# Patient Record
Sex: Female | Born: 1950
Health system: Southern US, Community
[De-identification: ages and names within clinical notes are randomized; demographics above are authoritative.]

## PROBLEM LIST (undated history)

## (undated) DIAGNOSIS — Z801 Family history of malignant neoplasm of trachea, bronchus and lung: Secondary | ICD-10-CM

## (undated) DIAGNOSIS — R600 Localized edema: Secondary | ICD-10-CM

## (undated) DIAGNOSIS — E669 Obesity, unspecified: Secondary | ICD-10-CM

## (undated) DIAGNOSIS — Z808 Family history of malignant neoplasm of other organs or systems: Secondary | ICD-10-CM

## (undated) DIAGNOSIS — M1990 Unspecified osteoarthritis, unspecified site: Secondary | ICD-10-CM

## (undated) DIAGNOSIS — Z8049 Family history of malignant neoplasm of other genital organs: Secondary | ICD-10-CM

## (undated) DIAGNOSIS — I10 Essential (primary) hypertension: Secondary | ICD-10-CM

## (undated) DIAGNOSIS — N200 Calculus of kidney: Secondary | ICD-10-CM

## (undated) DIAGNOSIS — Z800 Family history of malignant neoplasm of digestive organs: Secondary | ICD-10-CM

## (undated) DIAGNOSIS — Z806 Family history of leukemia: Secondary | ICD-10-CM

## (undated) DIAGNOSIS — C50919 Malignant neoplasm of unspecified site of unspecified female breast: Secondary | ICD-10-CM

## (undated) DIAGNOSIS — Z8041 Family history of malignant neoplasm of ovary: Secondary | ICD-10-CM

## (undated) DIAGNOSIS — I491 Atrial premature depolarization: Secondary | ICD-10-CM

## (undated) DIAGNOSIS — E785 Hyperlipidemia, unspecified: Secondary | ICD-10-CM

## (undated) DIAGNOSIS — Z87442 Personal history of urinary calculi: Secondary | ICD-10-CM

## (undated) HISTORY — DX: Family history of malignant neoplasm of ovary: Z80.41

## (undated) HISTORY — DX: Family history of leukemia: Z80.6

## (undated) HISTORY — DX: Family history of malignant neoplasm of digestive organs: Z80.0

## (undated) HISTORY — DX: Family history of malignant neoplasm of other organs or systems: Z80.8

## (undated) HISTORY — DX: Hyperlipidemia, unspecified: E78.5

## (undated) HISTORY — DX: Malignant neoplasm of unspecified site of unspecified female breast: C50.919

## (undated) HISTORY — DX: Family history of malignant neoplasm of trachea, bronchus and lung: Z80.1

## (undated) HISTORY — PX: CHOLECYSTECTOMY: SHX55

## (undated) HISTORY — DX: Localized edema: R60.0

## (undated) HISTORY — DX: Family history of malignant neoplasm of other genital organs: Z80.49

## (undated) HISTORY — PX: BREAST BIOPSY: SHX20

## (undated) HISTORY — DX: Obesity, unspecified: E66.9

---

## 1998-07-08 ENCOUNTER — Emergency Department (HOSPITAL_COMMUNITY): Admission: EM | Admit: 1998-07-08 | Discharge: 1998-07-08 | Payer: Self-pay | Admitting: *Deleted

## 1998-11-22 ENCOUNTER — Other Ambulatory Visit: Admission: RE | Admit: 1998-11-22 | Discharge: 1998-11-22 | Payer: Self-pay | Admitting: *Deleted

## 2000-01-23 ENCOUNTER — Other Ambulatory Visit: Admission: RE | Admit: 2000-01-23 | Discharge: 2000-01-23 | Payer: Self-pay | Admitting: *Deleted

## 2001-01-22 ENCOUNTER — Other Ambulatory Visit: Admission: RE | Admit: 2001-01-22 | Discharge: 2001-01-22 | Payer: Self-pay | Admitting: *Deleted

## 2002-03-25 ENCOUNTER — Other Ambulatory Visit: Admission: RE | Admit: 2002-03-25 | Discharge: 2002-03-25 | Payer: Self-pay | Admitting: *Deleted

## 2003-04-15 ENCOUNTER — Other Ambulatory Visit: Admission: RE | Admit: 2003-04-15 | Discharge: 2003-04-15 | Payer: Self-pay | Admitting: *Deleted

## 2004-05-12 ENCOUNTER — Other Ambulatory Visit: Admission: RE | Admit: 2004-05-12 | Discharge: 2004-05-12 | Payer: Self-pay | Admitting: *Deleted

## 2004-05-23 ENCOUNTER — Inpatient Hospital Stay (HOSPITAL_COMMUNITY): Admission: RE | Admit: 2004-05-23 | Discharge: 2004-05-26 | Payer: Self-pay | Admitting: Orthopedic Surgery

## 2004-08-08 ENCOUNTER — Ambulatory Visit (HOSPITAL_BASED_OUTPATIENT_CLINIC_OR_DEPARTMENT_OTHER): Admission: RE | Admit: 2004-08-08 | Discharge: 2004-08-08 | Payer: Self-pay | Admitting: Orthopedic Surgery

## 2004-12-11 HISTORY — PX: JOINT REPLACEMENT: SHX530

## 2005-06-06 ENCOUNTER — Ambulatory Visit (HOSPITAL_COMMUNITY): Admission: RE | Admit: 2005-06-06 | Discharge: 2005-06-06 | Payer: Self-pay | Admitting: *Deleted

## 2005-06-15 ENCOUNTER — Encounter (INDEPENDENT_AMBULATORY_CARE_PROVIDER_SITE_OTHER): Payer: Self-pay | Admitting: *Deleted

## 2005-06-15 ENCOUNTER — Encounter: Admission: RE | Admit: 2005-06-15 | Discharge: 2005-06-15 | Payer: Self-pay | Admitting: *Deleted

## 2005-06-15 ENCOUNTER — Other Ambulatory Visit: Admission: RE | Admit: 2005-06-15 | Discharge: 2005-06-15 | Payer: Self-pay | Admitting: *Deleted

## 2006-05-30 ENCOUNTER — Ambulatory Visit (HOSPITAL_COMMUNITY): Admission: RE | Admit: 2006-05-30 | Discharge: 2006-05-30 | Payer: Self-pay | Admitting: *Deleted

## 2007-07-08 ENCOUNTER — Ambulatory Visit (HOSPITAL_COMMUNITY): Admission: RE | Admit: 2007-07-08 | Discharge: 2007-07-08 | Payer: Self-pay | Admitting: *Deleted

## 2008-07-17 ENCOUNTER — Ambulatory Visit (HOSPITAL_COMMUNITY): Admission: RE | Admit: 2008-07-17 | Discharge: 2008-07-17 | Payer: Self-pay | Admitting: Gynecology

## 2008-08-12 ENCOUNTER — Other Ambulatory Visit: Admission: RE | Admit: 2008-08-12 | Discharge: 2008-08-12 | Payer: Self-pay | Admitting: Gynecology

## 2009-04-12 ENCOUNTER — Ambulatory Visit (HOSPITAL_COMMUNITY): Admission: RE | Admit: 2009-04-12 | Discharge: 2009-04-12 | Payer: Self-pay | Admitting: Family Medicine

## 2009-07-22 ENCOUNTER — Ambulatory Visit (HOSPITAL_COMMUNITY): Admission: RE | Admit: 2009-07-22 | Discharge: 2009-07-22 | Payer: Self-pay | Admitting: Gynecology

## 2011-01-01 ENCOUNTER — Encounter: Payer: Self-pay | Admitting: *Deleted

## 2011-01-02 ENCOUNTER — Encounter: Payer: Self-pay | Admitting: Gynecology

## 2011-04-28 NOTE — Discharge Summary (Signed)
NAMEPAYETON, [name]                           ACCOUNT NO.:  [id number]   MEDICAL RECORD NO.:  [id number]                   PATIENT TYPE:  INP   LOCATION:  5022                                 FACILITY:  [facility]   PHYSICIAN:  Robert A. Thurston Hole, M.D.              DATE OF BIRTH:  04/21/1951   DATE OF ADMISSION:  05/23/2004  DATE OF DISCHARGE:  05/26/2004                                 DISCHARGE SUMMARY   ADMISSION DIAGNOSIS:  End-stage degenerative joint disease left knee.   HISTORY OF PRESENT ILLNESS:  The patient is a 60 year old white female who  has end-stage DJD of both knees, left is more painful than right.  She has  tried conservative care including anti-inflammatories, cortisone injections  and debriding arthroscopy without success.  She has pain at night, pain with  rest unrelieved by conservative care.  She understands the risks, benefits  and possible complications of a left total knee replacement and is without  question.   PROCEDURES INHOUSE:  On May 23, 2004 the patient underwent a left total  knee replacement by Dr. Thurston Hole.  She tolerated the procedure well,  postoperatively she had a femoral nerve block by anesthesia, she was  admitted for pain control, DVT prophylaxis and physical therapy.  Postop day  one the patient was alert and oriented, doing well, no complaints,  hemoglobin was 11, sodium was 132, otherwise she was metabolically stable.  Her INR was 1.2, her surgical wound was well-approximated.  Postop day two  the patient continued to improve, T-max of 102, hemoglobin was 9.8, her  sodium was 131, she was up with physical therapy due to her temperature a  UA, chest x-ray and blood cultures were ordered.  Postop day three the  patient was doing well, she had been afebrile times 24 hours, her UA was  negative, chest x-ray showed slightly atelectasis, blood cultures were  negative.  She was discharged to home in stable condition.  Her hemoglobin  was 9.5, her INR  was 1.8.  She is weight-bearing as tolerated, on a regular  diet.   DISCHARGE MEDICATIONS:  1.  Percocet 1-2 q.4-6 hours p.r.n. pain.  2.  Robaxin 500 mg 1 q.6 hours p.r.n. muscle spasm.  3.  Coumadin 4 mg 1 tablet a day.  4.  Loestrin 1/20 1 tablet a day.  5.  Senokot-S 2 tablets before dinner.  6.  Colace 100 mg 1 tablet twice a day.   DISCHARGE INSTRUCTIONS:  Elevate left heel on a folded pillow 30 minutes a  day, work on passive knee extension CPM 0-70 degrees 8 hours a day, increase  by 5 degrees a day until reach 90 then continue at 90 8 hours a day until  you are 2 weeks postop.  She has been instructed to change her dressing  daily and look at her wound, she will call with increased redness, increased  drainage, increased  pain or a temperature greater than 101, and she will  follow up with Dr. Thurston Hole on June 06, 2004 for x-rays and suture removal.      Julien Girt, P.A.                  Robert A. Thurston Hole, M.D.    KS/MEDQ  D:  08/10/2004  T:  08/10/2004  Job:  [id number]

## 2011-04-28 NOTE — Op Note (Signed)
NAMESTATIA, BURDICK                           ACCOUNT NO.:  [id number]   MEDICAL RECORD NO.:  [id number]                   PATIENT TYPE:  AMB   LOCATION:  DSC                                  FACILITY:  [facility]   PHYSICIAN:  Robert A. Thurston Hole, M.D.              DATE OF BIRTH:  Aug 11, 1951   DATE OF PROCEDURE:  08/08/2004  DATE OF DISCHARGE:                                 OPERATIVE REPORT   PREOPERATIVE DIAGNOSIS:  Left total knee arthrofibrosis, three months status  post total knee arthroplasty.   POSTOPERATIVE DIAGNOSIS:  Left total knee arthrofibrosis, three months  status post total knee arthroplasty.   OPERATION/PROCEDURE:  1. Left knee examination under anesthesia followed by manipulation under     anesthesia.  2. Left knee cortisone injection.   SURGEON:  Elana Alm. Thurston Hole, M.D.   ASSISTANT:  Kirstin Shepperson, P.A.-C.   OPERATIVE TIME:  10 minutes.   COMPLICATIONS:  None.   INDICATIONS FOR PROCEDURE:  Mrs. Arruda is a 60 year old woman who had  undergone left total knee replacement approximately three months ago.  She  has had difficulty regaining flexion and now has range of motion from -3 to  95 degrees and is to undergo manipulation under anesthesia and injection.   DESCRIPTION OF PROCEDURE:  Mrs. Shryock was brought to the operating room  August 08, 2004, placed on the operating table in the supine position.  After adequate level of general anesthesia was obtained, initial range of  motion showed range of motion from -3 to 95 degrees.  Gentle manipulation  was carried out, breaking up soft adhesions and improving flexion to 130  degrees.  The knee remained stable on  ligamentous exam and extension  remained at -3.  The knee was then sterilely injected with 80 mg of Depo-  Medrol in 10 mL of 0.25% Marcaine with epinephrine.  She received Ancef 1 g  IV intraoperatively for prophylaxis.  The patient was then awakened and  taken to the recovery room in stable  condition.   FOLLOWUP CARE:  Mrs. Byer will be followed as a outpatient on Vicodin and  Naprosyn with early aggressive physical therapy.  See her back in the office  in a week for recheck and followup.                                               Robert A. Thurston Hole, M.D.    RAW/MEDQ  D:  08/08/2004  T:  08/08/2004  Job:  [id number]

## 2011-04-28 NOTE — Op Note (Signed)
[name], [name]                           ACCOUNT NO.:  [id number]   MEDICAL RECORD NO.:  [id number]                   PATIENT TYPE:  INP   LOCATION:  5022                                 FACILITY:  [facility]   PHYSICIAN:  Robert A. Thurston Hole, M.D.              DATE OF BIRTH:  [birth year]   DATE OF PROCEDURE:  05/23/2004  DATE OF DISCHARGE:                                 OPERATIVE REPORT   PREOPERATIVE DIAGNOSIS:  Left knee degenerative joint disease.   POSTOPERATIVE DIAGNOSIS:  Left knee degenerative joint disease.   PROCEDURE:  1. Left total knee replacement using [employer] Scorpio total knee system     with #7 cemented femur, #7 cemented tibia, with 15 mm polyethylene flexed     tibial spacer and a 26 mm polyethylene cemented patella.  2. Left knee lateral retinacular release.   SURGEON:  Elana Alm. Thurston Hole, M.D.   ASSISTANT:  Julien Girt, P.A.   ANESTHESIA:  General.   OPERATIVE TIME:  One hour and 30 minutes.   COMPLICATIONS:  None.   DESCRIPTION OF PROCEDURE:  The patient was brought to the operating room on  May 23, 2004, after a femoral nerve block had been placed in the holding  room by anesthesia for postoperative pain control.  She was placed on the  operating room table in the supine position.  After being placed under  general anesthesia, her left knee was examined.  The range of motion was -8  to 120 degrees, mild varus deformity, and the knee stable to ligamentous  examination with normal patella tracking.  She had a Foley catheter placed  under sterile conditions, and  received Ancef 1 g IV preoperatively for  prophylaxis.  The left leg was prepped using sterile DuraPrep and draped  using a sterile technique.  The leg was exsanguinated and a thigh tourniquet  elevated to 375 mmHg.  Initially through a 15 longitudinal incision based  over the patella, the initial exposure was made.  The underlying  subcutaneous tissues were incised along with the skin  incision.  A median  arthrotomy was performed, revealing an excessive amount of normal-appearing  joint fluid.  The articular surfaces were inspected.  She had grade 4  changes medially, grade 3 changes laterally, and grade 3 and 4 changes in  the patellofemoral joint.  The medial and lateral meniscal remnants were  removed, as well as the anterior cruciate ligament.  Osteophytes were  removed off the femoral condyles and tibial plateau.  An intramedullary  drill was then drilled up the femoral canal for placement of the distal  femoral cutting jig which was placed in the appropriate amount of rotation,  and a distal 10 mm cut was made.  The distal femur was incised.  A #7 was  found to be the appropriate size a #7 cutting jig was placed, and then these  cuts were made.  The proximal  tibia was then exposed.  The tibial spines  were then removed with an oscillating saw.  The intramedullary drill was  drilled down the tibial canal for the placement of the proximal tibial  cutting jig which is placed in the appropriate amount of rotation, and the  proximal 6 mm cut was made.  After this was done, then the Scorpio PCL  cutter was placed back on the distal femur, and these cuts were made.  At  this point then the #7 femoral trial was placed, the #7 tibial base plate,  and the trial was placed and with a 15 mm polyethylene flexed tibial spacer.  There was found to be excellent restoration of normal alignment, excellent  stability.  Range of motion was 0-125 degrees.  The tibial base plate was  then marked for rotation and the keel cut was made.  At this point then the  patella was sized.  A 26 mm was found to be the appropriate size, and a  recessed 10 mm x 26 mm cut was made, and three locking holes were placed.  After this was done, it was felt that all the trial components were of  excellent size, fit and stability.  They were then removed and the knee was  then jet lavaged and irrigated with  3 L of saline solution.  The proximal  tibia was then exposed and the #7 tibial base plate with cement backing was  hammered into position, with an excellent fit, with excess cement being  removed from around the edges.  The #7 femoral component with cement backing  was hammered into position, also with an excellent fit, with excess cement  being removed from around the edges.  The 15 mm polyethylene flexed tibial  spacer was locked onto the tibial base plate.  The 26 mm polyethylene cement-  backed patella was locking into its recessed hole and held there with a  clamp.  After the cement hardened, the knee was taken through a range of  motion of 0-125 degrees with excellent stability, excellent correction of a  varus deformity.  No lift-off on the tibial tray.  Normal patellar tracking  was noted after a lateral retinacular release was carried out.  At this  point it was felt that all of the components were of excellent size, fit and  stability.  The knee was further irrigated with saline and then the  arthrotomy was closed with #1 Ethibond suture over two medium Hemovac  drains.  The subcutaneous tissues were closed with #0 and #2-0 Vicryl.  The  skin was closed with skin staples.  Sterile dressings were applied.  The  Hemovac injected with 0.25% Marcaine with epinephrine and clamped.  Then a  long-leg splint was applied.  The tourniquet was released.  The needle and  sponge counts were correct x2 at the end of the case.  The patient was then awakened and taken to the recovery room in a stable  condition.                                               Robert A. Thurston Hole, M.D.    RAW/MEDQ  D:  05/23/2004  T:  05/23/2004  Job:  [id number]

## 2011-08-21 HISTORY — PX: US ECHOCARDIOGRAPHY: HXRAD669

## 2011-12-11 ENCOUNTER — Encounter (HOSPITAL_COMMUNITY): Payer: Self-pay

## 2011-12-11 ENCOUNTER — Ambulatory Visit (HOSPITAL_COMMUNITY)
Admission: RE | Admit: 2011-12-11 | Discharge: 2011-12-11 | Disposition: A | Discharge status: Home / Self Care | Payer: BC Managed Care – PPO | Source: Ambulatory Visit | Attending: Internal Medicine | Admitting: Internal Medicine

## 2011-12-11 ENCOUNTER — Other Ambulatory Visit (HOSPITAL_COMMUNITY): Payer: Self-pay | Admitting: Internal Medicine

## 2011-12-11 DIAGNOSIS — N200 Calculus of kidney: Secondary | ICD-10-CM | POA: Insufficient documentation

## 2011-12-11 DIAGNOSIS — R1031 Right lower quadrant pain: Secondary | ICD-10-CM | POA: Insufficient documentation

## 2011-12-11 HISTORY — DX: Calculus of kidney: N20.0

## 2011-12-11 HISTORY — DX: Essential (primary) hypertension: I10

## 2011-12-15 ENCOUNTER — Telehealth: Payer: Self-pay

## 2011-12-15 NOTE — Telephone Encounter (Signed)
LMOM to call.

## 2011-12-19 ENCOUNTER — Other Ambulatory Visit: Payer: Self-pay

## 2011-12-19 NOTE — Telephone Encounter (Signed)
  Gastroenterology Pre-Procedure Form   Request Date: 12/18/2011        Requesting Physician: Dr. Regino Schultze     PATIENT INFORMATION:  [name] is a 61 y.o., female (DOB=10/22/1951).  PROCEDURE: Procedure(s) requested: colonoscopy Procedure Reason: screening for colon cancer  PATIENT REVIEW QUESTIONS: The patient reports the following:   1. Diabetes Melitis: no 2. Joint replacements in the past 12 months: no 3. Major health problems in the past 3 months: no 4. Has an artificial valve or MVP:no 5. Has been advised in past to take antibiotics in advance of a procedure like teeth cleaning: no}    MEDICATIONS & ALLERGIES:    Patient reports the following regarding taking any blood thinners:   Plavix? no Aspirin?yes  Coumadin?  no  Patient confirms/reports the following medications:  Current Outpatient Prescriptions  Medication Sig Dispense Refill  . aspirin 81 MG tablet Take 81 mg by mouth daily.        Marland Kitchen ibuprofen (ADVIL,MOTRIN) 200 MG tablet Take 200 mg by mouth every 6 (six) hours as needed. Just takes occasionally       . simvastatin (ZOCOR) 20 MG tablet Take 20 mg by mouth every evening.          Patient confirms/reports the following allergies:  No Known Allergies  Patient is appropriate to schedule for requested procedure(s): yes  AUTHORIZATION INFORMATION Primary Insurance:   ID #:   Group #:  Pre-Cert / Auth required:  Pre-Cert / Auth #:   Secondary Insurance:   ID #:  Group #:  Pre-Cert / Auth required:  Pre-Cert / Auth #:   No orders of the defined types were placed in this encounter.    SCHEDULE INFORMATION: Procedure has been scheduled as follows:  Date: 01/01/2012       Time: 10:00 AM  Location: [facility] Short Stay  This Gastroenterology Pre-Precedure Form is being routed to the following provider(s) for review: R. Roetta Sessions, MD

## 2011-12-22 NOTE — Telephone Encounter (Signed)
Ok as is

## 2011-12-25 ENCOUNTER — Encounter (HOSPITAL_COMMUNITY): Payer: Self-pay | Admitting: Pharmacy Technician

## 2011-12-25 NOTE — Telephone Encounter (Signed)
Rx and instructions mailed to pt.  

## 2012-01-01 ENCOUNTER — Encounter (HOSPITAL_COMMUNITY)
Admission: RE | Disposition: A | Discharge status: Home / Self Care | Payer: Self-pay | Source: Ambulatory Visit | Attending: Internal Medicine

## 2012-01-01 ENCOUNTER — Ambulatory Visit (HOSPITAL_COMMUNITY)
Admission: RE | Admit: 2012-01-01 | Discharge: 2012-01-01 | Disposition: A | Discharge status: Home / Self Care | Payer: BC Managed Care – PPO | Source: Ambulatory Visit | Attending: Internal Medicine | Admitting: Internal Medicine

## 2012-01-01 ENCOUNTER — Encounter (HOSPITAL_COMMUNITY): Payer: Self-pay | Admitting: *Deleted

## 2012-01-01 DIAGNOSIS — I10 Essential (primary) hypertension: Secondary | ICD-10-CM | POA: Insufficient documentation

## 2012-01-01 DIAGNOSIS — Z79899 Other long term (current) drug therapy: Secondary | ICD-10-CM | POA: Insufficient documentation

## 2012-01-01 DIAGNOSIS — Z7982 Long term (current) use of aspirin: Secondary | ICD-10-CM | POA: Insufficient documentation

## 2012-01-01 DIAGNOSIS — K5730 Diverticulosis of large intestine without perforation or abscess without bleeding: Secondary | ICD-10-CM | POA: Insufficient documentation

## 2012-01-01 DIAGNOSIS — Z1211 Encounter for screening for malignant neoplasm of colon: Secondary | ICD-10-CM

## 2012-01-01 HISTORY — PX: COLONOSCOPY: SHX5424

## 2012-01-01 SURGERY — COLONOSCOPY
Anesthesia: Moderate Sedation

## 2012-01-01 MED ORDER — MEPERIDINE HCL 100 MG/ML IJ SOLN
INTRAMUSCULAR | Status: DC | PRN
  Administered 2012-01-01: 50 mg via INTRAVENOUS
  Administered 2012-01-01 (×2): 25 mg via INTRAVENOUS

## 2012-01-01 MED ORDER — SIMETHICONE 40 MG/0.6ML PO SUSP
ORAL | Status: DC | PRN
  Administered 2012-01-01: 10:00:00

## 2012-01-01 MED ORDER — SODIUM CHLORIDE 0.45 % IV SOLN
INTRAVENOUS | Status: DC
  Administered 2012-01-01: 1000 mL via INTRAVENOUS

## 2012-01-01 MED ORDER — MIDAZOLAM HCL 5 MG/5ML IJ SOLN
INTRAMUSCULAR | Status: DC | PRN
  Administered 2012-01-01 (×2): 1 mg via INTRAVENOUS
  Administered 2012-01-01: 2 mg via INTRAVENOUS
  Administered 2012-01-01: 1 mg via INTRAVENOUS

## 2012-01-01 MED ORDER — MEPERIDINE HCL 100 MG/ML IJ SOLN
INTRAMUSCULAR | Status: AC
  Filled 2012-01-01: qty 2

## 2012-01-01 MED ORDER — MIDAZOLAM HCL 5 MG/5ML IJ SOLN
INTRAMUSCULAR | Status: AC
  Filled 2012-01-01: qty 10

## 2012-01-01 NOTE — H&P (Signed)
  Primary Care Physician:  Kirk Ruths, MD, MD Primary Gastroenterologist:  Dr.Rourk  Pre-Procedure History & Physical: HPI:  [name] is a 61 y.o. female is here for a screening colonoscopy. No bowel symptoms. No family history of colon cancer or polyps. No prior colonoscopy.    Past Medical History  Diagnosis Date  . Hypertension   . Kidney stones before December 12, 2011    states she has passed stones 4 times/ tsf    Past Surgical History  Procedure Date  . Joint replacement 2006    left knee  . Cholecystectomy     Prior to Admission medications   Medication Sig Start Date End Date Taking? Authorizing Provider  aspirin 81 MG tablet Take 81 mg by mouth daily.      Historical Provider, MD  ibuprofen (ADVIL,MOTRIN) 200 MG tablet Take 200 mg by mouth every 6 (six) hours as needed. pain    Historical Provider, MD  lisinopril (PRINIVIL,ZESTRIL) 10 MG tablet Take 10 mg by mouth daily.    Historical Provider, MD  simvastatin (ZOCOR) 20 MG tablet Take 20 mg by mouth every evening.      Historical Provider, MD    Allergies as of 12/19/2011  . (No Known Allergies)    Family History  Problem Relation Age of Onset  . Anesthesia problems Neg Hx     History   Social History  . Marital Status: Married    Spouse Name: N/A    Number of Children: N/A  . Years of Education: N/A   Occupational History  . Not on file.   Social History Main Topics  . Smoking status: Never Smoker   . Smokeless tobacco: Not on file  . Alcohol Use: No  . Drug Use: No  . Sexually Active:    Other Topics Concern  . Not on file   Social History Narrative  . No narrative on file    Review of Systems: See HPI, otherwise negative ROS  Physical Exam: BP 166/100  Pulse 100  Temp(Src) 99.1 F (37.3 C) (Oral)  Resp 20  Ht 5\' 4"  (1.626 m)  Wt 200 lb (90.719 kg)  BMI 34.33 kg/m2  SpO2 97% General:   Alert,  Well-developed, well-nourished, pleasant and cooperative in NAD Head:   Normocephalic and atraumatic. Eyes:  Sclera clear, no icterus.   Conjunctiva pink. Ears:  Normal auditory acuity. Nose:  No deformity, discharge,  or lesions. Mouth:  No deformity or lesions, dentition normal. Neck:  Supple; no masses or thyromegaly. Lungs:  Clear throughout to auscultation.   No wheezes, crackles, or rhonchi. No acute distress. Heart:  Regular rate and rhythm; no murmurs, clicks, rubs,  or gallops. Abdomen: Nondistended. Positive bowel sounds soft nontender without appreciable mass or organomegaly Msk:  Symmetrical without gross deformities. Normal posture. Pulses:  Normal pulses noted. Extremities:  Without clubbing or edema. Neurologic:  Alert and  oriented x4;  grossly normal neurologically. Skin:  Intact without significant lesions or rashes. Cervical Nodes:  No significant cervical adenopathy. Psych:  Alert and cooperative. Normal mood and affect.  Impression/Plan: [name] is now here to undergo a screening colonoscopy.   First-ever average risk screening examination.  Risks, benefits, limitations, imponderables and alternatives regarding colonoscopy have been reviewed with the patient. Questions have been answered. All parties agreeable.

## 2012-01-01 NOTE — Op Note (Signed)
[facility] [address] [place], [place]  [address]  COLONOSCOPY PROCEDURE REPORT  PATIENT:  [name], [name]  MR#:  [id number] BIRTHDATE:  1951-08-25, 60 yrs. old  GENDER:  female ENDOSCOPIST:  R. Roetta Sessions, MD FACP [facility] REF. BY:  Karleen Hampshire, M.D. PROCEDURE DATE:  01/01/2012 PROCEDURE:  Screening colonoscopy  INDICATIONS:  First ever average risk screening examination  INFORMED CONSENT:  The risks, benefits, alternatives and imponderables including but not limited to bleeding, perforation as well as the possibility of a missed lesion have been reviewed. The potential for biopsy, lesion removal, etc. have also been discussed.  Questions have been answered.  All parties agreeable. Please see the history and physical in the medical record for more information.  MEDICATIONS:  Versed 5 mg IV and Demerol 100 mg IV in divided doses.  DESCRIPTION OF PROCEDURE:  After a digital rectal exam was performed, the EC-[id number] ([id number]) colonoscope was advanced from the anus through the rectum and colon to the area of the cecum, ileocecal valve and appendiceal orifice.  The cecum was deeply intubated.  These structures were well-seen and photographed for the record.  From the level of the cecum and ileocecal valve, the scope was slowly and cautiously withdrawn.  The mucosal surfaces were carefully surveyed utilizing scope tip deflection to facilitate fold flattening as needed.  The scope was pulled down into the rectum where a thorough examination including retroflexion was performed. <<PROCEDUREIMAGES>>  FINDINGS:   adequate preparation.  normal rectum. A few, scattered left-sided diverticula; remainder of colonic mucosa appeared normal  THERAPEUTIC / DIAGNOSTIC MANEUVERS PERFORMED:   none  COMPLICATIONS:    none  CECAL WITHDRAWAL TIME:  8 minutes  IMPRESSION:   Colonic diverticulosis  RECOMMENDATIONS:   Repeat screening colonoscopy in 10  years.  ______________________________ R. Roetta Sessions, MD Caleen Essex  CC:  Karleen Hampshire, M.D.  n. eSIGNED:   R. Roetta Sessions at 01/01/2012 10:42 AM  Maida Sale, [phone]

## 2012-01-08 ENCOUNTER — Encounter (HOSPITAL_COMMUNITY): Payer: Self-pay | Admitting: Internal Medicine

## 2012-07-09 ENCOUNTER — Other Ambulatory Visit (HOSPITAL_COMMUNITY): Payer: Self-pay | Admitting: Rehabilitation

## 2012-07-09 DIAGNOSIS — Z139 Encounter for screening, unspecified: Secondary | ICD-10-CM

## 2012-07-12 ENCOUNTER — Other Ambulatory Visit (HOSPITAL_COMMUNITY): Payer: Self-pay | Admitting: Gynecology

## 2012-07-12 ENCOUNTER — Ambulatory Visit (HOSPITAL_COMMUNITY)
Admission: RE | Admit: 2012-07-12 | Discharge: 2012-07-12 | Disposition: A | Discharge status: Home / Self Care | Payer: BC Managed Care – PPO | Source: Ambulatory Visit | Attending: Rehabilitation | Admitting: Rehabilitation

## 2012-07-12 DIAGNOSIS — Z139 Encounter for screening, unspecified: Secondary | ICD-10-CM

## 2012-07-12 DIAGNOSIS — Z1231 Encounter for screening mammogram for malignant neoplasm of breast: Secondary | ICD-10-CM | POA: Insufficient documentation

## 2013-07-11 ENCOUNTER — Encounter: Payer: Self-pay | Admitting: *Deleted

## 2013-07-17 ENCOUNTER — Ambulatory Visit (INDEPENDENT_AMBULATORY_CARE_PROVIDER_SITE_OTHER): Payer: BC Managed Care – PPO | Admitting: Cardiovascular Disease

## 2013-07-17 ENCOUNTER — Encounter: Payer: Self-pay | Admitting: Cardiovascular Disease

## 2013-07-17 VITALS — BP 122/76 | HR 71 | Ht 63.0 in | Wt 189.4 lb

## 2013-07-17 DIAGNOSIS — E785 Hyperlipidemia, unspecified: Secondary | ICD-10-CM | POA: Insufficient documentation

## 2013-07-17 DIAGNOSIS — Z79899 Other long term (current) drug therapy: Secondary | ICD-10-CM

## 2013-07-17 DIAGNOSIS — I491 Atrial premature depolarization: Secondary | ICD-10-CM | POA: Insufficient documentation

## 2013-07-17 DIAGNOSIS — I10 Essential (primary) hypertension: Secondary | ICD-10-CM

## 2013-07-17 MED ORDER — TRIAMTERENE-HCTZ 37.5-25 MG PO CAPS: 1.0000 | ORAL_CAPSULE | ORAL | Status: DC

## 2013-07-17 MED ORDER — SIMVASTATIN 20 MG PO TABS: 20.0000 mg | ORAL_TABLET | Freq: Every evening | ORAL | Status: DC

## 2013-07-17 NOTE — Assessment & Plan Note (Signed)
Asymptomatic, no need for specific therapy. She does have a mild to moderately dilated left atrium by echo and therefore may be at risk for a total fibrillation. We spent sometime discussing the atrial arrhythmia and its potential consequences. She is to present immediately for electrocardiograms if she has a sustained irregular heartbeat.

## 2013-07-17 NOTE — Progress Notes (Signed)
Patient ID: [name], female   DOB: 04/09/1951, 62 y.o.   MRN: [id number]     Reason for office visit Yearly followup for hypertension and hyperlipidemia  Everline has done well in the years that has passed since her last appointment. She has occasional ankle edema but for the most part the swelling is well controlled on her current medications. She denies problems with palpitations dizziness lightheadedness syncope headaches focal neurological deficits or intermittent claudication. Denies any chest pain or shortness of breath either at rest or with exertion. Her blood pressure has remained good even though we stopped her lisinopril and her ACE inhibitor related cough has resolved. She has lost over 10 pounds in weight in the years since her last appointment and she is still trying to lose more weight.    No Known Allergies  Current Outpatient Prescriptions  Medication Sig Dispense Refill  . aspirin 81 MG tablet Take 81 mg by mouth daily.        Marland Kitchen ibuprofen (ADVIL,MOTRIN) 200 MG tablet Take 200 mg by mouth every 6 (six) hours as needed. pain      . simvastatin (ZOCOR) 20 MG tablet Take 1 tablet (20 mg total) by mouth every evening.  90 tablet  3  . triamterene-hydrochlorothiazide (DYAZIDE) 37.5-25 MG per capsule Take 1 each (1 capsule total) by mouth every morning.  90 capsule  3   No current facility-administered medications for this visit.    Past Medical History  Diagnosis Date  . Hypertension   . Kidney stones before 01/08/2012    states she has passed stones 4 times/ tsf  . Hyperlipemia   . Lower extremity edema   . Obesity     Past Surgical History  Procedure Laterality Date  . Joint replacement  2006    left knee  . Cholecystectomy    . Colonoscopy  01/01/2012    Procedure: COLONOSCOPY;  Surgeon: Corbin Ade, MD;  [facility];  Service: Endoscopy;  Laterality: N/A;  10:00 AM  . US echocardiography  08/21/2011    LA mild to mod. dilated,mild MR,TR     Family History  Problem Relation Age of Onset  . Anesthesia problems Neg Hx   . Hypertension Mother   . Hypertension Father   . Heart attack Father     History   Social History  . Marital Status: Married    Spouse Name: N/A    Number of Children: N/A  . Years of Education: N/A   Occupational History  . Not on file.   Social History Main Topics  . Smoking status: Never Smoker   . Smokeless tobacco: Not on file  . Alcohol Use: No  . Drug Use: No  . Sexually Active:    Other Topics Concern  . Not on file   Social History Narrative  . No narrative on file    Review of systems: The patient specifically denies any chest pain at rest or with exertion, dyspnea at rest or with exertion, orthopnea, paroxysmal nocturnal dyspnea, syncope, palpitations, focal neurological deficits, intermittent claudication, unexplained weight gain, cough, hemoptysis or wheezing.  The patient also denies abdominal pain, nausea, vomiting, dysphagia, diarrhea, constipation, polyuria, polydipsia, dysuria, hematuria, frequency, urgency, abnormal bleeding or bruising, fever, chills, unexpected weight changes, mood swings, change in skin or hair texture, change in voice quality, auditory or visual problems, allergic reactions or rashes, new musculoskeletal complaints other than usual "aches and pains".   PHYSICAL EXAM BP 122/76  Pulse  71  Ht 5\' 3"  (1.6 m)  Wt 189 lb 6.4 oz (85.911 kg)  BMI 33.56 kg/m2  General: Alert, oriented x3, no distress Head: no evidence of trauma, PERRL, EOMI, no exophtalmos or lid lag, no myxedema, no xanthelasma; normal ears, nose and oropharynx Neck: normal jugular venous pulsations and no hepatojugular reflux; brisk carotid pulses without delay and no carotid bruits Chest: clear to auscultation, no signs of consolidation by percussion or palpation, normal fremitus, symmetrical and full respiratory excursions Cardiovascular: normal position and quality of the apical  impulse, regular rhythm, normal first and second heart sounds, no murmurs, rubs or gallops Abdomen: no tenderness or distention, no masses by palpation, no abnormal pulsatility or arterial bruits, normal bowel sounds, no hepatosplenomegaly Extremities: no clubbing, cyanosis or edema; 2+ radial, ulnar and brachial pulses bilaterally; 2+ right femoral, posterior tibial and dorsalis pedis pulses; 2+ left femoral, posterior tibial and dorsalis pedis pulses; no subclavian or femoral bruits Neurological: grossly nonfocal   EKG: .NSR with frequent premature show contractions  Lipid Panel August 2013 total cholesterol 186, triglycerides 129, HDL 58, LDL 102   BMET August 2013 creatinine 0.6, BUN 16, normal liver function tests except bilirubin 1.5, potassium 4.5, sodium 139   ASSESSMENT AND PLAN PAC (premature atrial contractions) Asymptomatic, no need for specific therapy. She does have a mild to moderately dilated left atrium by echo and therefore may be at risk for a total fibrillation. We spent sometime discussing the atrial arrhythmia and its potential consequences. She is to present immediately for electrocardiograms if she has a sustained irregular heartbeat.  Hyperlipidemia Her lipid profile is generally favorable one year ago said that the LDL cholesterol was slightly over target. She has lost considerable weight since then and aspect we will see improvement even though she remains in the obese range. Repeat labs today.  HTN (hypertension) Her blood pressure is under excellent control. I suspect that the hypertensive heart disease as the cause for left atrial enlargement. No changes or major medications.  Orders Placed This Encounter  Procedures  . Lipid Profile  . Comp Met (CMET)  . EKG 12-Lead   Meds ordered this encounter  Medications  . simvastatin (ZOCOR) 20 MG tablet    Sig: Take 1 tablet (20 mg total) by mouth every evening.    Dispense:  90 tablet    Refill:  3  .  triamterene-hydrochlorothiazide (DYAZIDE) 37.5-25 MG per capsule    Sig: Take 1 each (1 capsule total) by mouth every morning.    Dispense:  90 capsule    Refill:  3    Grazia Taffe  Thurmon Fair, MD, [facility] [phone] office [phone] pager

## 2013-07-17 NOTE — Assessment & Plan Note (Signed)
Her lipid profile is generally favorable one year ago said that the LDL cholesterol was slightly over target. She has lost considerable weight since then and aspect we will see improvement even though she remains in the obese range. Repeat labs today.

## 2013-07-17 NOTE — Patient Instructions (Signed)
Your physician encouraged you to lose weight for better health.  Your physician recommends that you check blood tests for a FASTING lipid profile  Your physician recommends that you schedule a follow-up appointment in: 1 year

## 2013-07-17 NOTE — Assessment & Plan Note (Signed)
Her blood pressure is under excellent control. I suspect that the hypertensive heart disease as the cause for left atrial enlargement. No changes or major medications.

## 2013-07-21 LAB — LIPID PANEL
LDL Cholesterol: 101 mg/dL — ABNORMAL HIGH (ref 0–99)
Total CHOL/HDL Ratio: 2.8 Ratio
VLDL: 24 mg/dL (ref 0–40)

## 2013-07-21 LAB — COMPREHENSIVE METABOLIC PANEL
AST: 16 U/L (ref 0–37)
Albumin: 4.4 g/dL (ref 3.5–5.2)
Alkaline Phosphatase: 68 U/L (ref 39–117)
BUN: 21 mg/dL (ref 6–23)
Potassium: 4.6 mEq/L (ref 3.5–5.3)
Sodium: 140 mEq/L (ref 135–145)
Total Protein: 6.9 g/dL (ref 6.0–8.3)

## 2013-07-22 ENCOUNTER — Encounter: Payer: Self-pay | Admitting: Cardiovascular Disease

## 2013-07-22 ENCOUNTER — Encounter: Payer: Self-pay | Admitting: *Deleted

## 2013-08-17 ENCOUNTER — Other Ambulatory Visit: Payer: Self-pay | Admitting: Cardiovascular Disease

## 2013-08-19 NOTE — Telephone Encounter (Signed)
Rx was sent to pharmacy electronically. 

## 2014-09-01 ENCOUNTER — Other Ambulatory Visit: Payer: Self-pay | Admitting: Cardiovascular Disease

## 2014-09-01 NOTE — Telephone Encounter (Signed)
Rx was sent to pharmacy electronically. Patient needs office visit for future refills. First warning given.

## 2014-12-14 ENCOUNTER — Encounter: Payer: Self-pay | Admitting: Cardiovascular Disease

## 2014-12-14 ENCOUNTER — Ambulatory Visit (INDEPENDENT_AMBULATORY_CARE_PROVIDER_SITE_OTHER): Payer: BC Managed Care – PPO | Admitting: Cardiovascular Disease

## 2014-12-14 VITALS — BP 138/90 | HR 91 | Ht 63.0 in | Wt 203.5 lb

## 2014-12-14 DIAGNOSIS — I491 Atrial premature depolarization: Secondary | ICD-10-CM

## 2014-12-14 DIAGNOSIS — I10 Essential (primary) hypertension: Secondary | ICD-10-CM

## 2014-12-14 DIAGNOSIS — E785 Hyperlipidemia, unspecified: Secondary | ICD-10-CM

## 2014-12-14 MED ORDER — SIMVASTATIN 20 MG PO TABS: 20.0000 mg | ORAL_TABLET | Freq: Every evening | ORAL | Status: DC

## 2014-12-14 MED ORDER — TRIAMTERENE-HCTZ 37.5-25 MG PO CAPS: 1.0000 | ORAL_CAPSULE | Freq: Every day | ORAL | Status: DC

## 2014-12-14 NOTE — Patient Instructions (Addendum)
Your physician wants you to follow-up in: 12 Months You will receive a reminder letter in the mail two months in advance. If you don't receive a letter, please call our office to schedule the follow-up appointment.  Your physician recommends that you return for lab work C[facility], [place]

## 2014-12-14 NOTE — Progress Notes (Signed)
Reason for office visit Hypertension and hyperlipidemia  [name] has done well in the years that has passed since her last appointment. She has occasional ankle edema but for the most part the swelling is well controlled on her current medications. She denies chest pain or shortness of breath either at rest or with exertion. Her blood pressure has remained good and is usually substantially lower when checked at home. She  He is unaware of the irregularities in her heartbeat that I can hear during examination.  None were caught on her electrocardiogram, but in the past they have been PACs.  She has a mildly to moderately dilated left atrium by previous echo. Atrial fibrillation has never been detected. Unfortunately she has gained back substantial weight since her appointment a year ago   No Known Allergies  Current Outpatient Prescriptions  Medication Sig Dispense Refill  . aspirin 81 MG tablet Take 81 mg by mouth daily.      Marland Kitchen ibuprofen (ADVIL,MOTRIN) 200 MG tablet Take 200 mg by mouth every 6 (six) hours as needed. pain    . simvastatin (ZOCOR) 20 MG tablet Take 1 tablet (20 mg total) by mouth every evening. 90 tablet 3  . triamterene-hydrochlorothiazide (DYAZIDE) 37.5-25 MG per capsule Take 1 each (1 capsule total) by mouth daily. 90 capsule 3   No current facility-administered medications for this visit.    Past Medical History  Diagnosis Date  . Hypertension   . Kidney stones before 12/17/11    states she has passed stones 4 times/ tsf  . Hyperlipemia   . Lower extremity edema   . Obesity     Past Surgical History  Procedure Laterality Date  . Joint replacement  2006    left knee  . Cholecystectomy    . Colonoscopy  01/01/2012    Procedure: COLONOSCOPY;  Surgeon: Daneil Dolin, MD;  [facility];  Service: Endoscopy;  Laterality: N/A;  10:00 AM  . US echocardiography  08/21/2011    LA mild to mod. dilated,mild MR,TR    Family History  Problem Relation Age  of Onset  . Anesthesia problems Neg Hx   . Hypertension Mother   . Hypertension Father   . Heart attack Father     History   Social History  . Marital Status: Married    Spouse Name: N/A    Number of Children: N/A  . Years of Education: N/A   Occupational History  . Not on file.   Social History Main Topics  . Smoking status: Never Smoker   . Smokeless tobacco: Not on file  . Alcohol Use: No  . Drug Use: No  . Sexual Activity: Not on file   Other Topics Concern  . Not on file   Social History Narrative  . No narrative on file    Review of systems: The patient specifically denies any chest pain at rest or with exertion, dyspnea at rest or with exertion, orthopnea, paroxysmal nocturnal dyspnea, syncope, palpitations, focal neurological deficits, intermittent claudication, lower extremity edema, unexplained weight gain, cough, hemoptysis or wheezing.  The patient also denies abdominal pain, nausea, vomiting, dysphagia, diarrhea, constipation, polyuria, polydipsia, dysuria, hematuria, frequency, urgency, abnormal bleeding or bruising, fever, chills, unexpected weight changes, mood swings, change in skin or hair texture, change in voice quality, auditory or visual problems, allergic reactions or rashes, new musculoskeletal complaints other than usual "aches and pains".   PHYSICAL EXAM BP 138/90 mmHg  Pulse 91  Ht 5'  3" (1.6 m)  Wt 203 lb 8 oz (92.307 kg)  BMI 36.06 kg/m2  General: Alert, oriented x3, no distress Head: no evidence of trauma, PERRL, EOMI, no exophtalmos or lid lag, no myxedema, no xanthelasma; normal ears, nose and oropharynx Neck: normal jugular venous pulsations and no hepatojugular reflux; brisk carotid pulses without delay and no carotid bruits Chest: clear to auscultation, no signs of consolidation by percussion or palpation, normal fremitus, symmetrical and full respiratory excursions Cardiovascular: normal position and quality of the apical impulse,  regular rhythm  With occasional ectopy, normal first and second heart sounds, no murmurs, rubs or gallops Abdomen: no tenderness or distention, no masses by palpation, no abnormal pulsatility or arterial bruits, normal bowel sounds, no hepatosplenomegaly Extremities: no clubbing, cyanosis or edema; 2+ radial, ulnar and brachial pulses bilaterally; 2+ right femoral, posterior tibial and dorsalis pedis pulses; 2+ left femoral, posterior tibial and dorsalis pedis pulses; no subclavian or femoral bruits Neurological: grossly nonfocal   EKG: Normal sinus rhythm, poor R progression, no changes from previous tracings  Lipid Panel     Component Value Date/Time   CHOL 196 07/21/2013 0845   TRIG 120 07/21/2013 0845   HDL 71 07/21/2013 0845   CHOLHDL 2.8 07/21/2013 0845   VLDL 24 07/21/2013 0845   LDLCALC 101* 07/21/2013 0845    BMET    Component Value Date/Time   NA 140 07/21/2013 0845   K 4.6 07/21/2013 0845   CL 104 07/21/2013 0845   CO2 28 07/21/2013 0845   GLUCOSE 93 07/21/2013 0845   BUN 21 07/21/2013 0845   CREATININE 0.77 07/21/2013 0845   CALCIUM 10.1 07/21/2013 0845     ASSESSMENT AND PLAN  Hypertension Adequate control. Check potassium and renal function.  Hyperlipidemia Time to repeat her lipid profile   Obesity, moderate Encouraged her to revisit the changes in her diet that helped her lose weight a year before last  PACs Asymptomatic, no specific therapy needed, monitor for atrial fibrillation (if this is detected she should start anticoagulants).  Orders Placed This Encounter  Procedures  . Comp Met (CMET)  . Lipid Profile  . EKG 12-Lead   Meds ordered this encounter  Medications  . simvastatin (ZOCOR) 20 MG tablet    Sig: Take 1 tablet (20 mg total) by mouth every evening.    Dispense:  90 tablet    Refill:  3  . triamterene-hydrochlorothiazide (DYAZIDE) 37.5-25 MG per capsule    Sig: Take 1 each (1 capsule total) by mouth daily.    Dispense:  90  capsule    Refill:  Gilbert Brannen Koppen, MD, [facility] [phone] office [phone] pager

## 2014-12-15 ENCOUNTER — Telehealth: Payer: Self-pay | Admitting: Cardiovascular Disease

## 2014-12-15 NOTE — Telephone Encounter (Signed)
Pt called in stating that she would like to speak to [facility] about yesterday's visit . Please call  Thanks

## 2014-12-20 LAB — LIPID PANEL
CHOLESTEROL: 205 mg/dL — AB (ref 0–200)
HDL: 60 mg/dL (ref >39)
LDL Cholesterol: 114 mg/dL — ABNORMAL HIGH (ref 0–99)
Total CHOL/HDL Ratio: 3.4 Ratio
Triglycerides: 155 mg/dL — ABNORMAL HIGH (ref <150)
VLDL: 31 mg/dL (ref 0–40)

## 2014-12-20 LAB — COMPREHENSIVE METABOLIC PANEL
ALT: 15 U/L (ref 0–35)
AST: 17 U/L (ref 0–37)
Albumin: 4.1 g/dL (ref 3.5–5.2)
Alkaline Phosphatase: 68 U/L (ref 39–117)
BILIRUBIN TOTAL: 1 mg/dL (ref 0.2–1.2)
BUN: 21 mg/dL (ref 6–23)
CHLORIDE: 104 meq/L (ref 96–112)
CO2: 27 mEq/L (ref 19–32)
Calcium: 10 mg/dL (ref 8.4–10.5)
Creat: 0.91 mg/dL (ref 0.50–1.10)
Glucose, Bld: 88 mg/dL (ref 70–99)
Potassium: 4.4 mEq/L (ref 3.5–5.3)
Sodium: 141 mEq/L (ref 135–145)
Total Protein: 6.8 g/dL (ref 6.0–8.3)

## 2015-12-13 ENCOUNTER — Other Ambulatory Visit: Payer: Self-pay | Admitting: Cardiovascular Disease

## 2015-12-14 NOTE — Telephone Encounter (Signed)
REFILL 

## 2015-12-30 ENCOUNTER — Ambulatory Visit (INDEPENDENT_AMBULATORY_CARE_PROVIDER_SITE_OTHER): Payer: BC Managed Care – PPO | Admitting: Cardiovascular Disease

## 2015-12-30 ENCOUNTER — Encounter: Payer: Self-pay | Admitting: Cardiovascular Disease

## 2015-12-30 VITALS — BP 136/82 | HR 96 | Ht 63.0 in | Wt 201.0 lb

## 2015-12-30 DIAGNOSIS — I872 Venous insufficiency (chronic) (peripheral): Secondary | ICD-10-CM

## 2015-12-30 DIAGNOSIS — E785 Hyperlipidemia, unspecified: Secondary | ICD-10-CM | POA: Diagnosis not present

## 2015-12-30 DIAGNOSIS — Z79899 Other long term (current) drug therapy: Secondary | ICD-10-CM | POA: Diagnosis not present

## 2015-12-30 DIAGNOSIS — I491 Atrial premature depolarization: Secondary | ICD-10-CM | POA: Diagnosis not present

## 2015-12-30 DIAGNOSIS — I10 Essential (primary) hypertension: Secondary | ICD-10-CM | POA: Diagnosis not present

## 2015-12-30 MED ORDER — TRIAMTERENE-HCTZ 37.5-25 MG PO TABS: 1.0000 | ORAL_TABLET | Freq: Every day | ORAL | Status: DC

## 2015-12-30 MED ORDER — SIMVASTATIN 20 MG PO TABS: 20.0000 mg | ORAL_TABLET | Freq: Every evening | ORAL | Status: DC

## 2015-12-30 NOTE — Progress Notes (Signed)
Patient ID: [name], female   DOB: Dec 20, 1950, 64 y.o.   MRN: [id number]    Cardiology Office Note    Date:  12/30/2015   ID:  [name], DOB [birth year], MRN [id number]  PCP:  Leonides Grills, MD  Cardiologist:   Sanda Klein, MD   Chief Complaint  Patient presents with  . Annual Exam  . Edema    FEET AND ANKLES    History of Present Illness:  [name] is a 65 y.o. female with mild hypertension, symptomatic premature atrial contractions and hyperlipidemia who returns for follow-up.  Despite treatment with a diuretic combination, she has chronic lower extremity edema that is neither better nor worse compared to last year. She does not like wearing compression stockings. She does have some varicose veins. Chest not had any skin ulcerations and she has visible varicose veins. She is not troubled by palpitations. She also denies any dyspnea or angina either at rest or with activity. Overall she feels very well. She has not had a lipid profile checked in the last 12 months.    Past Medical History  Diagnosis Date  . Hypertension   . Kidney stones before 12/26/2011    states she has passed stones 4 times/ tsf  . Hyperlipemia   . Lower extremity edema   . Obesity     Past Surgical History  Procedure Laterality Date  . Joint replacement  2006    left knee  . Cholecystectomy    . Colonoscopy  01/01/2012    Procedure: COLONOSCOPY;  Surgeon: Daneil Dolin, MD;  [facility];  Service: Endoscopy;  Laterality: N/A;  10:00 AM  . US echocardiography  08/21/2011    LA mild to mod. dilated,mild MR,TR    Outpatient Prescriptions Prior to Visit  Medication Sig Dispense Refill  . aspirin 81 MG tablet Take 81 mg by mouth daily.      Marland Kitchen ibuprofen (ADVIL,MOTRIN) 200 MG tablet Take 200 mg by mouth every 6 (six) hours as needed. pain    . simvastatin (ZOCOR) 20 MG tablet Take 1 tablet (20 mg total) by mouth every evening. 90 tablet 3  .  triamterene-hydrochlorothiazide (MAXZIDE-25) 37.5-25 MG tablet Take 1 tablet by mouth daily. KEEP OV. 90 tablet 0   No facility-administered medications prior to visit.     Allergies:   Review of patient's allergies indicates no known allergies.   Social History   Social History  . Marital Status: Married    Spouse Name: N/A  . Number of Children: N/A  . Years of Education: N/A   Social History Main Topics  . Smoking status: Never Smoker   . Smokeless tobacco: None  . Alcohol Use: No  . Drug Use: No  . Sexual Activity: Not Asked   Other Topics Concern  . None   Social History Narrative     Family History:  The patient's family history includes Heart attack in her father; Hypertension in her father and mother. There is no history of Anesthesia problems.   ROS:   Please see the history of present illness.    ROS All other systems reviewed and are negative.   PHYSICAL EXAM:   VS:  BP 136/82 mmHg  Pulse 96  Ht 5\' 3"  (1.6 m)  Wt 201 lb (91.173 kg)  BMI 35.61 kg/m2   GEN: Well nourished, well developed, in no acute distress HEENT: normal Neck: no JVD, carotid bruits, or masses Cardiac: RRR with occasional ectopy; no  murmurs, rubs, or gallops, 2+ nonpitting edema of the ankles and lower calves in a symmetrical pattern, superficial varicose veins seen.  Respiratory:  clear to auscultation bilaterally, normal work of breathing GI: soft, nontender, nondistended, + BS MS: no deformity or atrophy Skin: warm and dry, no rash Neuro:  Alert and Oriented x 3, Strength and sensation are intact Psych: euthymic mood, full affect  Wt Readings from Last 3 Encounters:  12/30/15 201 lb (91.173 kg)  12/14/14 203 lb 8 oz (92.307 kg)  07/17/13 189 lb 6.4 oz (85.911 kg)      Studies/Labs Reviewed:   EKG:  EKG is ordered today.  The ekg ordered today demonssinus rhythm, normal tracing  Recent Labs: No results found for requested labs within last 365 days.   Lipid Panel      Component Value Date/Time   CHOL 205* 12/19/2014 1038   TRIG 155* 12/19/2014 1038   HDL 60 12/19/2014 1038   CHOLHDL 3.4 12/19/2014 1038   VLDL 31 12/19/2014 1038   LDLCALC 114* 12/19/2014 1038     ASSESSMENT:    1. Essential hypertension   2. Dyslipidemia   3. PAC (premature atrial contractions)   4. Peripheral venous insufficiency   5. Medication management     PLAN:  In order of problems listed above:  1. BP well controlled 2. time to repeat lipid profile  3. currently asymptomatic from rhythm point of view  4. Encouraged to keep legs elevated as much as possible, wear compression stockings during the day, monitor for poor skin healing  5. check LFTs, electrolytes and renal function due to treatment with diuretic and statin     Medication Adjustments/Labs and Tests Ordered: Current medicines are reviewed at length with the patient today.  Concerns regarding medicines are outlined above.  Medication changes, Labs and Tests ordered today are listed in the Patient Instructions below. Patient Instructions  Your physician recommends that you return for lab work at your earliest [place].  Dr Sallyanne Kuster recommends that you schedule a follow-up appointment in 1 year. You will receive a reminder letter in the mail two months in advance. If you don't receive a letter, please call our office to schedule the follow-up appointment.  If you need a refill on your cardiac medications before your next appointment, please call your pharmacy.      Mikael Spray, MD  12/30/2015 5:54 PM    [address], [place], [place]  [id number] Phone: [phone]; Fax: [phone]

## 2015-12-30 NOTE — Patient Instructions (Signed)
Your physician recommends that you return for lab work at your earliest [place].  Dr Sallyanne Kuster recommends that you schedule a follow-up appointment in 1 year. You will receive a reminder letter in the mail two months in advance. If you don't receive a letter, please call our office to schedule the follow-up appointment.  If you need a refill on your cardiac medications before your next appointment, please call your pharmacy.

## 2016-01-01 LAB — LIPID PANEL
CHOL/HDL RATIO: 3 ratio (ref <=5.0)
CHOLESTEROL: 172 mg/dL (ref 125–200)
HDL: 58 mg/dL (ref >=46)
LDL CALC: 86 mg/dL (ref <130)
TRIGLYCERIDES: 141 mg/dL (ref <150)
VLDL: 28 mg/dL (ref <30)

## 2016-01-01 LAB — COMPREHENSIVE METABOLIC PANEL
ALK PHOS: 70 U/L (ref 33–130)
ALT: 15 U/L (ref 6–29)
AST: 15 U/L (ref 10–35)
Albumin: 4.2 g/dL (ref 3.6–5.1)
BUN: 21 mg/dL (ref 7–25)
CALCIUM: 10 mg/dL (ref 8.6–10.4)
CHLORIDE: 103 mmol/L (ref 98–110)
CO2: 28 mmol/L (ref 20–31)
Creat: 0.78 mg/dL (ref 0.50–0.99)
GLUCOSE: 94 mg/dL (ref 65–99)
POTASSIUM: 4.2 mmol/L (ref 3.5–5.3)
Sodium: 140 mmol/L (ref 135–146)
Total Bilirubin: 1 mg/dL (ref 0.2–1.2)
Total Protein: 6.8 g/dL (ref 6.1–8.1)

## 2016-12-28 ENCOUNTER — Other Ambulatory Visit: Payer: Self-pay | Admitting: Cardiovascular Disease

## 2016-12-29 NOTE — Telephone Encounter (Signed)
Rx has been sent to the pharmacy electronically. ° °

## 2017-01-04 ENCOUNTER — Ambulatory Visit (INDEPENDENT_AMBULATORY_CARE_PROVIDER_SITE_OTHER): Payer: BC Managed Care – PPO | Admitting: Cardiovascular Disease

## 2017-01-04 ENCOUNTER — Encounter: Payer: Self-pay | Admitting: Cardiovascular Disease

## 2017-01-04 VITALS — BP 138/76 | HR 84 | Ht 63.0 in | Wt 200.0 lb

## 2017-01-04 DIAGNOSIS — I10 Essential (primary) hypertension: Secondary | ICD-10-CM | POA: Diagnosis not present

## 2017-01-04 DIAGNOSIS — Z79899 Other long term (current) drug therapy: Secondary | ICD-10-CM

## 2017-01-04 DIAGNOSIS — I491 Atrial premature depolarization: Secondary | ICD-10-CM

## 2017-01-04 DIAGNOSIS — E785 Hyperlipidemia, unspecified: Secondary | ICD-10-CM | POA: Diagnosis not present

## 2017-01-04 NOTE — Patient Instructions (Signed)
Medication Instructions: Dr Croitoru recommends that you continue on your current medications as directed. Please refer to the Current Medication list given to you today.  Labwork: Your physician recommends that you return for lab work at your earliest convenience - FASTING.  Testing/Procedures: NONE ORDERED  Follow-up: Dr Croitoru recommends that you schedule a follow-up appointment in 1 year. You will receive a reminder letter in the mail two months in advance. If you don't receive a letter, please call our office to schedule the follow-up appointment.  If you need a refill on your cardiac medications before your next appointment, please call your pharmacy. 

## 2017-01-04 NOTE — Progress Notes (Signed)
Patient ID: [name], female   DOB: 03/23/1951, 66 y.o.   MRN: [id number]    Cardiology Office Note    Date:  01/04/2017   ID:  MORENE HEILIG, DOB [birth year], MRN [id number]  PCP:  Leonides Grills, MD  Cardiologist:   Sanda Klein, MD   Chief Complaint  Patient presents with  . Follow-up    1 YEAR  . Leg Swelling    LKower legs, feet, and ankles.    History of Present Illness:  [name] is a 66 y.o. female with Peripheral venous insufficiency mild hypertension, symptomatic premature atrial contractions and hyperlipidemia who returns for follow-up.  She continues to have chronic lower extremity edema and finds uncomfortable to wear compression stockings. She has not had any skin ulcers, weeping wounds or other complications. She does have visible varicose veins. She is unaware of the irregularity in her rhythm, although her ECG today shows 3 PACs during the 10 second strip. She denies exertional angina or dyspnea, syncope, neurological complaints, claudication or other cardiovascular problems.  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  Past Medical History:  Diagnosis Date  . Hyperlipemia   . Hypertension   . Kidney stones before 2012/01/07   states she has passed stones 4 times/ tsf  . Lower extremity edema   . Obesity     Past Surgical History:  Procedure Laterality Date  . CHOLECYSTECTOMY    . COLONOSCOPY  01/01/2012   Procedure: COLONOSCOPY;  Surgeon: Daneil Dolin, MD;  [facility];  Service: Endoscopy;  Laterality: N/A;  10:00 AM  . JOINT REPLACEMENT  2006   left knee  . US ECHOCARDIOGRAPHY  08/21/2011   LA mild to mod. dilated,mild MR,TR    Outpatient Medications Prior to Visit  Medication Sig Dispense Refill  . aspirin 81 MG tablet Take 81 mg by mouth daily.      Marland Kitchen ibuprofen (ADVIL,MOTRIN) 200 MG tablet Take 200 mg by mouth every 6  (six) hours as needed. pain    . simvastatin (ZOCOR) 20 MG tablet Take 1 tablet (20 mg total) by mouth every evening. 90 tablet 3  . triamterene-hydrochlorothiazide (MAXZIDE-25) 37.5-25 MG tablet TAKE 1 TABLET BY MOUTH DAILY. 90 tablet 0   No facility-administered medications prior to visit.      Allergies:   Patient has no known allergies.   Social History   Social History  . Marital status: Married    Spouse name: N/A  . Number of children: N/A  . Years of education: N/A   Social History Main Topics  . Smoking status: Never Smoker  . Smokeless tobacco: Never Used  . Alcohol use No  . Drug use: No  . Sexual activity: Not Asked   Other Topics Concern  . None   Social History Narrative  . None     Family History:  The patient's family history includes Heart attack in her father; Hypertension in her father and mother.   ROS:   Please see the history of present illness.    ROS All other systems reviewed and are negative.   PHYSICAL EXAM:   VS:  BP 138/76   Pulse 84   Ht 5\' 3"  (1.6 m)   Wt 90.7 kg (200 lb)   BMI 35.43 kg/m    GEN: Well nourished, well developed, in no acute distress  HEENT: normal  Neck: no JVD, carotid bruits, or masses Cardiac: RRR with occasional ectopy; no murmurs, rubs, or gallops, 2+ nonpitting edema of the ankles and lower calves in a symmetrical pattern, superficial varicose veins seen.  Respiratory:  clear to auscultation bilaterally, normal work of breathing GI: soft, nontender, nondistended, + BS MS: no deformity or atrophy  Skin: warm and dry, no rash Neuro:  Alert and Oriented x 3, Strength and sensation are intact Psych: euthymic mood, full affect  Wt Readings from Last 3 Encounters:  01/04/17 90.7 kg (200 lb)  12/30/15 91.2 kg (201 lb)  12/14/14 92.3 kg (203 lb 8 oz)      Studies/Labs Reviewed:   EKG:  EKG is ordered today.  The ekg ordered today demonssinus rhythm with frequent PACs, otherwise normal tracing  Recent  Labs: No results found for requested labs within last 8760 hours.   Lipid Panel    Component Value Date/Time   CHOL 172 12/30/2015 0837   TRIG 141 12/30/2015 0837   HDL 58 12/30/2015 0837   CHOLHDL 3.0 12/30/2015 0837   VLDL 28 12/30/2015 0837   LDLCALC 86 12/30/2015 0837     ASSESSMENT:    No diagnosis found.  PLAN:  In order of problems listed above:  1. HTN: BP well controlled 2. HLP: time to repeat lipid profile  3. PACs: currently asymptomatic from rhythm point of view  4. Venous insufficiency : Encouraged to keep legs elevated as much as possible, wear compression stockings during the day, monitor for poor skin healing  5. check LFTs, electrolytes and renal function due to treatment with diuretic and statin     Medication Adjustments/Labs and Tests Ordered: Current medicines are reviewed at length with the patient today.  Concerns regarding medicines are outlined above.  Medication changes, Labs and Tests ordered today are listed in the Patient Instructions below. There are no Patient Instructions on file for this visit.     Signed, Sanda Klein, MD  01/04/2017 4:44 PM    [facility] [place], [place], [place]  [address] Phone: [phone]; Fax: [phone]

## 2017-01-13 LAB — COMPREHENSIVE METABOLIC PANEL
ALBUMIN: 4.1 g/dL (ref 3.6–5.1)
ALK PHOS: 68 U/L (ref 33–130)
ALT: 16 U/L (ref 6–29)
AST: 15 U/L (ref 10–35)
BUN: 20 mg/dL (ref 7–25)
CALCIUM: 10.1 mg/dL (ref 8.6–10.4)
CO2: 26 mmol/L (ref 20–31)
CREATININE: 0.86 mg/dL (ref 0.50–0.99)
Chloride: 104 mmol/L (ref 98–110)
GLUCOSE: 94 mg/dL (ref 65–99)
Potassium: 4.6 mmol/L (ref 3.5–5.3)
SODIUM: 142 mmol/L (ref 135–146)
Total Bilirubin: 1.2 mg/dL (ref 0.2–1.2)
Total Protein: 6.9 g/dL (ref 6.1–8.1)

## 2017-01-13 LAB — LIPID PANEL
Cholesterol: 175 mg/dL (ref <200)
HDL: 61 mg/dL (ref >50)
LDL CALC: 84 mg/dL (ref <100)
TRIGLYCERIDES: 150 mg/dL — AB (ref <150)
Total CHOL/HDL Ratio: 2.9 Ratio (ref <5.0)
VLDL: 30 mg/dL (ref <30)

## 2017-02-15 ENCOUNTER — Other Ambulatory Visit: Payer: Self-pay | Admitting: Cardiovascular Disease

## 2017-02-15 NOTE — Telephone Encounter (Signed)
Rx(s) sent to pharmacy electronically.  

## 2017-04-01 ENCOUNTER — Other Ambulatory Visit: Payer: Self-pay | Admitting: Cardiovascular Disease

## 2017-04-02 NOTE — Telephone Encounter (Signed)
Rx(s) sent to pharmacy electronically.  

## 2017-06-11 ENCOUNTER — Ambulatory Visit (INDEPENDENT_AMBULATORY_CARE_PROVIDER_SITE_OTHER): Payer: BC Managed Care – PPO | Admitting: Physician Assistant

## 2017-06-11 ENCOUNTER — Encounter: Payer: Self-pay | Admitting: Physician Assistant

## 2017-06-11 VITALS — BP 140/86 | HR 84 | Temp 97.8°F | Resp 16 | Ht 63.0 in | Wt 198.4 lb

## 2017-06-11 DIAGNOSIS — E785 Hyperlipidemia, unspecified: Secondary | ICD-10-CM

## 2017-06-11 DIAGNOSIS — E2839 Other primary ovarian failure: Secondary | ICD-10-CM

## 2017-06-11 DIAGNOSIS — Z23 Encounter for immunization: Secondary | ICD-10-CM

## 2017-06-11 DIAGNOSIS — Z0000 Encounter for general adult medical examination without abnormal findings: Secondary | ICD-10-CM | POA: Diagnosis not present

## 2017-06-11 DIAGNOSIS — Z780 Asymptomatic menopausal state: Secondary | ICD-10-CM

## 2017-06-11 DIAGNOSIS — I10 Essential (primary) hypertension: Secondary | ICD-10-CM

## 2017-06-11 LAB — TSH: TSH: 1.43 mIU/L

## 2017-06-11 LAB — CBC WITH DIFFERENTIAL/PLATELET
Basophils Absolute: 0 cells/uL (ref 0–200)
Basophils Relative: 0 %
EOS PCT: 2 %
Eosinophils Absolute: 194 cells/uL (ref 15–500)
HCT: 39.5 % (ref 35.0–45.0)
Hemoglobin: 13 g/dL (ref 12.0–15.0)
LYMPHS PCT: 29 %
Lymphs Abs: 2813 cells/uL (ref 850–3900)
MCH: 27 pg (ref 27.0–33.0)
MCHC: 32.9 g/dL (ref 32.0–36.0)
MCV: 82.1 fL (ref 80.0–100.0)
MONOS PCT: 5 %
MPV: 9 fL (ref 7.5–12.5)
Monocytes Absolute: 485 cells/uL (ref 200–950)
NEUTROS ABS: 6208 {cells}/uL (ref 1500–7800)
Neutrophils Relative %: 64 %
PLATELETS: 225 10*3/uL (ref 140–400)
RBC: 4.81 MIL/uL (ref 3.80–5.10)
RDW: 15 % (ref 11.0–15.0)
WBC: 9.7 10*3/uL (ref 3.8–10.8)

## 2017-06-11 LAB — COMPLETE METABOLIC PANEL WITH GFR
ALT: 17 U/L (ref 6–29)
AST: 19 U/L (ref 10–35)
Albumin: 4.5 g/dL (ref 3.6–5.1)
Alkaline Phosphatase: 71 U/L (ref 33–130)
BILIRUBIN TOTAL: 1.4 mg/dL — AB (ref 0.2–1.2)
BUN: 15 mg/dL (ref 7–25)
CHLORIDE: 104 mmol/L (ref 98–110)
CO2: 24 mmol/L (ref 20–31)
Calcium: 10 mg/dL (ref 8.6–10.4)
Creat: 1.01 mg/dL — ABNORMAL HIGH (ref 0.50–0.99)
GFR, EST NON AFRICAN AMERICAN: 59 mL/min — AB (ref >=60)
GFR, Est African American: 68 mL/min (ref >=60)
GLUCOSE: 89 mg/dL (ref 70–99)
POTASSIUM: 4.2 mmol/L (ref 3.5–5.3)
SODIUM: 140 mmol/L (ref 135–146)
Total Protein: 7.1 g/dL (ref 6.1–8.1)

## 2017-06-11 LAB — LIPID PANEL
CHOL/HDL RATIO: 2.8 ratio (ref <5.0)
Cholesterol: 187 mg/dL (ref <200)
HDL: 67 mg/dL (ref >50)
LDL Cholesterol: 90 mg/dL (ref <100)
TRIGLYCERIDES: 151 mg/dL — AB (ref <150)
VLDL: 30 mg/dL (ref <30)

## 2017-06-11 NOTE — Progress Notes (Signed)
Patient ID: BOBI DAUDELIN MRN: [id number], DOB: 1951/10/17, 66 y.o. Date of Encounter: 06/11/2017,   Chief Complaint: Physical (CPE)  HPI: 66 y.o. y/o female  here for CPE.     She is being seen as a new patient to establish care and is here for complete physical exam.  Her husband is a patient of mine.  He runs a dairy farm.  She has been seeing "Dr. Loletha Grayer" for cardiology. I reviewed his last visit note. She sees him to manage hypertension, hyperlipidemia, venous insufficiency, PACs.  She states that her prior PCP initially had her see him because she has significant family history of CAD.  She states that she has no other known chronic medical problems.  States that she would only go see her PCP for acute illnesses.  States that she has had no complete physical exam in many many years. Has had no lab work other than what Dr. Loletha Grayer does to monitor her medicines. Has had no pelvic exam or mammogram in a long time.  She has no specific concerns to address today.  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 Her husband runs a dairy farm.    Review of Systems: Consitutional: No fever, chills, fatigue, night sweats, lymphadenopathy. No significant/unexplained weight changes. Eyes: No visual changes, eye redness, or discharge. ENT/Mouth: No ear pain, sore throat, nasal drainage, or sinus pain. Cardiovascular: No chest pressure,heaviness, tightness or squeezing, even with exertion. No increased shortness of breath or dyspnea on exertion.No palpitations, edema, orthopnea, PND. Respiratory: No cough, hemoptysis, SOB, or wheezing. Gastrointestinal: No anorexia, dysphagia, reflux, pain, nausea, vomiting, hematemesis, diarrhea, constipation, BRBPR, or melena. Breast: No mass, nodules, bulging, or retraction. No skin changes or inflammation. No nipple discharge. No lymphadenopathy. Genitourinary: No dysuria,  hematuria, incontinence, vaginal discharge, pruritis, burning, abnormal bleeding, or pain. Musculoskeletal: No decreased ROM, No joint pain or swelling. No significant pain in neck, back, or extremities. Skin: No rash, pruritis, or concerning lesions. Neurological: No headache, dizziness, syncope, seizures, tremors, memory loss, coordination problems, or paresthesias. Psychological: No anxiety, depression, hallucinations, SI/HI. Endocrine: No polydipsia, polyphagia, polyuria, or known diabetes.No increased fatigue. No palpitations/rapid heart rate. No significant/unexplained weight change. All other systems were reviewed and are otherwise negative.    Past Medical History:  Diagnosis Date  . Hyperlipemia   . Hypertension   . Kidney stones before 2012-01-05   states she has passed stones 4 times/ tsf  . Lower extremity edema   . Obesity      Past Surgical History:  Procedure Laterality Date  . CHOLECYSTECTOMY    . COLONOSCOPY  01/01/2012   Procedure: COLONOSCOPY;  Surgeon: Daneil Dolin, MD;  [facility];  Service: Endoscopy;  Laterality: N/A;  10:00 AM  . JOINT REPLACEMENT  2006   left knee  . US ECHOCARDIOGRAPHY  08/21/2011   LA mild to mod. dilated,mild MR,TR    Home Meds:  Outpatient Medications Prior to Visit  Medication Sig Dispense Refill  . aspirin 81 MG tablet Take 81 mg by mouth daily.      Marland Kitchen ibuprofen (ADVIL,MOTRIN) 200 MG tablet Take 200 mg by mouth every 6 (six) hours as needed. pain    . simvastatin (ZOCOR) 20 MG tablet TAKE 1 TABLET (20 MG TOTAL) BY MOUTH EVERY EVENING. 90 tablet 3  . triamterene-hydrochlorothiazide (MAXZIDE-25) 37.5-25 MG tablet Take 1 tablet by mouth daily. 90 tablet  2   No facility-administered medications prior to visit.     Allergies: No Known Allergies  Social History   Social History  . Marital status: Married    Spouse name: N/A  . Number of children: N/A  . Years of education: N/A   Occupational History  . Not on  file.   Social History Main Topics  . Smoking status: Never Smoker  . Smokeless tobacco: Never Used  . Alcohol use No  . Drug use: No  . Sexual activity: Not on file   Other Topics Concern  . Not on file   Social History Narrative  . No narrative on file    Family History  Problem Relation Age of Onset  . Hypertension Mother   . Hypertension Father   . Heart attack Father   . Anesthesia problems Neg Hx     Physical Exam: Blood pressure 140/86, pulse 84, temperature 97.8 F (36.6 C), temperature source Oral, resp. rate 16, height 5\' 3"  (1.6 m), weight 198 lb 6.4 oz (90 kg), SpO2 97 %., Body mass index is 35.14 kg/m. General: Obese WF. Appears in no acute distress. HEENT: Normocephalic, atraumatic. Conjunctiva pink, sclera non-icteric. Pupils 2 mm constricting to 1 mm, round, regular, and equally reactive to light and accomodation. EOMI. Internal auditory canal clear. TMs with good cone of light and without pathology. Nasal mucosa pink. Nares are without discharge. No sinus tenderness. Oral mucosa pink.  Pharynx without exudate. Poor dentition. Multiple fillings.  Neck: Supple. Trachea midline. No thyromegaly. Full ROM. No lymphadenopathy.No Carotid Bruits. Lungs: Clear to auscultation bilaterally without wheezes, rales, or rhonchi. Breathing is of normal effort and unlabored. Cardiovascular: RRR with S1 S2. No murmurs, rubs, or gallops. Distal pulses 2+ symmetrically. No carotid or abdominal bruits. Breast: Symmetrical. No masses. Nipples without discharge. Abdomen: Soft, non-tender, non-distended with normoactive bowel sounds. No hepatosplenomegaly or masses. No rebound/guarding. No CVA tenderness. No hernias.  Genitourinary:  External genitalia without lesions. Vaginal mucosa pink.No discharge present. Cervix pink and without discharge. No cervical tenderness.Normal uterus size. No adnexal mass or tenderness.  Pap smear taken. Musculoskeletal: Full range of motion and 5/5  strength throughout.  Skin: Warm and moist without erythema, ecchymosis, wounds, or rash. Neuro: A+Ox3. CN II-XII grossly intact. Moves all extremities spontaneously. Full sensation throughout. Normal gait.  Psych:  Responds to questions appropriately with a normal affect.   Assessment/Plan:  66 y.o. y/o female here for CPE  1. Encounter for medical examination to establish care   2. Encounter for preventive health examination  A. Screening Labs: She is fasting. Check labs now. - CBC with Differential/Platelet - COMPLETE METABOLIC PANEL WITH GFR - Lipid panel - TSH - VITAMIN D 25 Hydroxy (Vit-D Deficiency, Fractures)  B. Pap: She is age 89. However has not had Pap smear in many years and does want to get this today. Will do Pap today. If this one is normal she does not need any further Pap smears in the future as she is age 20. - PAP, Thin Prep w/HPV rflx HPV Type 16/18  C. Screening Mammogram: She has not had mammogram in multiple years but is agreeable to have this done. - MM Digital Screening; Future  D. DEXA/BMD:  She has not had DEXA scan. She is age 35 so we'll check this to screen for osteoporosis. Postmenopausal - DG Bone Density; Future Estrogen deficiency - DG Bone Density; Future  E. Colorectal Cancer Screening: She has had colonoscopy and this is in epic so  I reviewed that today. This was performed 01/01/2012. Normal. Repeat 10 years.  F. Immunizations:  Influenza:-------N/A---July Tetanus:------ she states that she has had no recent immunizations no tetanus in the past 10 years. She is not on Medicare yet. We'll go ahead and give Tdap today--Tdap given here 06/11/2017 Pneumococcal:--- She states that she has had no pneumonia vaccine. Given that she is age 53 recommend that she have Prevnar 1 today and then Pneumovax 23 in 6-12 months. She is agreeable.  -----------------------Prevnar 13-- given here 06/11/2017 Shingrix:---------Wrote this on AVS as reminder-- and  discussed with her she is to call her insurance to check on coverage and cost and then let us know whether she wants this or not.     3. Essential hypertension Blood pressure is controlled. Continue current medication. Check lab to monitor. - COMPLETE METABOLIC PANEL WITH GFR  4. Hyperlipidemia, unspecified hyperlipidemia type She is on simvastatin. Check lab to monitor. - COMPLETE METABOLIC PANEL WITH GFR - Lipid panel  5. Family history of CAD   Will plan for routine follow-up visit 6 months. Follow-up sooner if needed.    Signed, [address] [place], [place], [facility] 06/11/2017 11:01 AM

## 2017-06-11 NOTE — Addendum Note (Signed)
Addended by: Vonna Kotyk A on: 06/11/2017 12:43 PM   Modules accepted: Orders

## 2017-06-12 LAB — VITAMIN D 25 HYDROXY (VIT D DEFICIENCY, FRACTURES): Vit D, 25-Hydroxy: 24 ng/mL — ABNORMAL LOW (ref 30–100)

## 2017-06-15 LAB — PAP, THIN PREP W/HPV RFLX HPV TYPE 16/18: HPV DNA High Risk: NOT DETECTED

## 2017-06-20 ENCOUNTER — Other Ambulatory Visit: Payer: Self-pay

## 2017-06-20 DIAGNOSIS — E559 Vitamin D deficiency, unspecified: Secondary | ICD-10-CM | POA: Insufficient documentation

## 2017-06-20 MED ORDER — VITAMIN D 50 MCG (2000 UT) PO TABS: 2000.0000 [IU] | ORAL_TABLET | Freq: Every day | ORAL | Status: DC

## 2017-06-28 ENCOUNTER — Other Ambulatory Visit: Payer: Self-pay | Admitting: Physician Assistant

## 2017-06-28 DIAGNOSIS — Z1231 Encounter for screening mammogram for malignant neoplasm of breast: Secondary | ICD-10-CM

## 2017-06-28 DIAGNOSIS — E2839 Other primary ovarian failure: Secondary | ICD-10-CM

## 2017-07-10 ENCOUNTER — Ambulatory Visit
Admission: RE | Admit: 2017-07-10 | Discharge: 2017-07-10 | Disposition: A | Discharge status: Home / Self Care | Payer: BC Managed Care – PPO | Source: Ambulatory Visit | Attending: Physician Assistant | Admitting: Physician Assistant

## 2017-07-10 ENCOUNTER — Other Ambulatory Visit: Payer: BC Managed Care – PPO

## 2017-07-10 DIAGNOSIS — Z1231 Encounter for screening mammogram for malignant neoplasm of breast: Secondary | ICD-10-CM

## 2017-07-10 DIAGNOSIS — E2839 Other primary ovarian failure: Secondary | ICD-10-CM

## 2017-12-12 ENCOUNTER — Ambulatory Visit: Payer: BC Managed Care – PPO | Admitting: Physician Assistant

## 2017-12-20 ENCOUNTER — Other Ambulatory Visit: Payer: Self-pay

## 2017-12-20 ENCOUNTER — Ambulatory Visit: Payer: BC Managed Care – PPO | Admitting: Physician Assistant

## 2017-12-20 ENCOUNTER — Encounter: Payer: Self-pay | Admitting: Physician Assistant

## 2017-12-20 VITALS — BP 134/82 | HR 75 | Temp 97.6°F | Resp 14 | Ht 63.0 in | Wt 199.8 lb

## 2017-12-20 DIAGNOSIS — E559 Vitamin D deficiency, unspecified: Secondary | ICD-10-CM

## 2017-12-20 DIAGNOSIS — I10 Essential (primary) hypertension: Secondary | ICD-10-CM

## 2017-12-20 DIAGNOSIS — E785 Hyperlipidemia, unspecified: Secondary | ICD-10-CM

## 2017-12-20 NOTE — Progress Notes (Signed)
Patient ID: [name] MRN: [id number], DOB: Mar 08, 1951, 67 y.o. Date of Encounter: 12/20/2017,   Chief Complaint: Physical (CPE)  HPI: 67 y.o. y/o female     06/11/2017:  here for CPE.     She is being seen as a new patient to establish care and is here for complete physical exam.  Her husband is a patient of mine.  He runs a dairy farm.  She has been seeing "Dr. Loletha Grayer" for cardiology. I reviewed his last visit note. She sees him to manage hypertension, hyperlipidemia, venous insufficiency, PACs.  She states that her prior PCP initially had her see him because she has significant family history of CAD.  She states that she has no other known chronic medical problems.  States that she would only go see her PCP for acute illnesses.  States that she has had no complete physical exam in many many years. Has had no lab work other than what Dr. Loletha Grayer does to monitor her medicines. Has had no pelvic exam or mammogram in a long time.  She has no specific concerns to address today.  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 Her husband runs a dairy farm.    12/20/2017: She presents for routine OV to f/u HTN and Hyperlipidemia.  Also, at her CPE 06/12/2107--lab showed Vit D deficiency. Recommended to start Vit D 2,000 units QD. Today she reports that she has been taking this as directed.  She is taking BP meds as directed. Haivng no lightheadedness or other adverse effects.  She is taking simvastatin as directed. Having no myalgias or other adverse effects.  Reviewed with her that she has not had recent OV with "Dr. Loletha Grayer". She reports she has OV with him in February.  Her husband had recent OV with me--was having significant symptoms that led to CT and hospitalization. I told her to let him know I am thinking of him and will f/u to see what specialists find.  She has no specific concerns to address  today. She has been feeling fine, stable. No angina symptoms, even with exertion.      Review of Systems: Consitutional: No fever, chills, fatigue, night sweats, lymphadenopathy. No significant/unexplained weight changes. Eyes: No visual changes, eye redness, or discharge. ENT/Mouth: No ear pain, sore throat, nasal drainage, or sinus pain. Cardiovascular: No chest pressure,heaviness, tightness or squeezing, even with exertion. No increased shortness of breath or dyspnea on exertion.No palpitations, edema, orthopnea, PND. Respiratory: No cough, hemoptysis, SOB, or wheezing. Gastrointestinal: No anorexia, dysphagia, reflux, pain, nausea, vomiting, hematemesis, diarrhea, constipation, BRBPR, or melena. Breast: No mass, nodules, bulging, or retraction. No skin changes or inflammation. No nipple discharge. No lymphadenopathy. Genitourinary: No dysuria, hematuria, incontinence, vaginal discharge, pruritis, burning, abnormal bleeding, or pain. Musculoskeletal: No decreased ROM, No joint pain or swelling. No significant pain in neck, back, or extremities. Skin: No rash, pruritis, or concerning lesions. Neurological: No headache, dizziness, syncope, seizures, tremors, memory loss, coordination problems, or paresthesias. Psychological: No anxiety, depression, hallucinations, SI/HI. Endocrine: No polydipsia, polyphagia, polyuria, or known diabetes.No increased fatigue. No palpitations/rapid heart rate. No significant/unexplained weight change. All other systems were reviewed and are otherwise negative.    Past Medical History:  Diagnosis Date  . Hyperlipemia   . Hypertension   . Kidney stones before 12-25-11   states she has passed stones 4 times/ tsf  . Lower extremity edema   .  Obesity      Past Surgical History:  Procedure Laterality Date  . BREAST BIOPSY Left   . CHOLECYSTECTOMY    . COLONOSCOPY  01/01/2012   Procedure: COLONOSCOPY;  Surgeon: Daneil Dolin, MD;  [facility];  Service: Endoscopy;  Laterality: N/A;  10:00 AM  . JOINT REPLACEMENT  2006   left knee  . US ECHOCARDIOGRAPHY  08/21/2011   LA mild to mod. dilated,mild MR,TR    Home Meds:  Outpatient Medications Prior to Visit  Medication Sig Dispense Refill  . aspirin 81 MG tablet Take 81 mg by mouth daily.      . Cholecalciferol (VITAMIN D) 2000 units tablet Take 1 tablet (2,000 Units total) by mouth daily.    Marland Kitchen ibuprofen (ADVIL,MOTRIN) 200 MG tablet Take 200 mg by mouth every 6 (six) hours as needed. pain    . simvastatin (ZOCOR) 20 MG tablet TAKE 1 TABLET (20 MG TOTAL) BY MOUTH EVERY EVENING. 90 tablet 3  . triamterene-hydrochlorothiazide (MAXZIDE-25) 37.5-25 MG tablet Take 1 tablet by mouth daily. 90 tablet 2   No facility-administered medications prior to visit.     Allergies: No Known Allergies  Social History   Socioeconomic History  . Marital status: Married    Spouse name: Not on file  . Number of children: Not on file  . Years of education: Not on file  . Highest education level: Not on file  Social Needs  . Financial resource strain: Not on file  . Food insecurity - worry: Not on file  . Food insecurity - inability: Not on file  . Transportation needs - medical: Not on file  . Transportation needs - non-medical: Not on file  Occupational History  . Not on file  Tobacco Use  . Smoking status: Never Smoker  . Smokeless tobacco: Never Used  Substance and Sexual Activity  . Alcohol use: No  . Drug use: No  . Sexual activity: Not on file  Other Topics Concern  . Not on file  Social History Narrative  . Not on file    Family History  Problem Relation Age of Onset  . Hypertension Mother   . Hypertension Father   . Heart attack Father   . Anesthesia problems Neg Hx     Physical Exam: Blood pressure 134/82, pulse 75, temperature 97.6 F (36.4 C), temperature source Oral, resp. rate 14, height 5\' 3"  (1.6 m), weight 90.6 kg (199 lb 12.8 oz), SpO2 98 %., Body mass  index is 35.39 kg/m. General: Obese WF. Appears in no acute distress. Neck: Supple. Trachea midline. No thyromegaly. Full ROM. No lymphadenopathy.No Carotid Bruits. Lungs: Clear to auscultation bilaterally without wheezes, rales, or rhonchi. Breathing is of normal effort and unlabored. Cardiovascular: RRR with S1 S2. No murmurs, rubs, or gallops. Distal pulses 2+ symmetrically. No carotid or abdominal bruits. Abdomen: Soft, non-tender, non-distended with normoactive bowel sounds. No hepatosplenomegaly or masses. No rebound/guarding. No CVA tenderness. No hernias.  Musculoskeletal: Full range of motion and 5/5 strength throughout.  Skin: Warm and moist without erythema, ecchymosis, wounds, or rash. Neuro: A+Ox3. CN II-XII grossly intact. Moves all extremities spontaneously. Full sensation throughout. Normal gait.  Psych:  Responds to questions appropriately with a normal affect.   Assessment/Plan:  67 y.o. y/o female here for    Essential hypertension BP at goal. Cont current meds. Check lab to monitor.  - COMPLETE METABOLIC PANEL WITH GFR  Hyperlipidemia, unspecified hyperlipidemia type LDL was at goal on lab 06/11/2017---She is  not fasting today--Check LFTs to monitor - COMPLETE METABOLIC PANEL WITH GFR  Family h/o CAD --Sees "Dr. Loletha Grayer" annually --Risk factor reduction with keeping BP, lipids at goal.  Vitamin D deficiency Lab 06/11/2017 revealed Vit D Def--Recommended for her to start 2,000 units QD. She reports she is taking this. Recheck level to monitor - VITAMIN D 25 Hydroxy (Vit-D Deficiency, Fractures)   F/U in 6 months, sooner if needed.     --------------------THE FOLLOWING IS COPIED FROM NOTE 06/11/2017---NOT ADDRESSED AT OV 12/20/2017----------------------  1. Encounter for medical examination to establish care  2. Encounter for preventive health examination  A. Screening Labs: She is fasting. Check labs now. - CBC with Differential/Platelet - COMPLETE METABOLIC PANEL  WITH GFR - Lipid panel - TSH - VITAMIN D 25 Hydroxy (Vit-D Deficiency, Fractures)  B. Pap: She is age 31. However has not had Pap smear in many years and does want to get this today. Will do Pap today. If this one is normal she does not need any further Pap smears in the future as she is age 10. - PAP, Thin Prep w/HPV rflx HPV Type 16/18  C. Screening Mammogram: She has not had mammogram in multiple years but is agreeable to have this done. - MM Digital Screening; Future  D. DEXA/BMD:  She has not had DEXA scan. She is age 11 so we'll check this to screen for osteoporosis. Postmenopausal - DG Bone Density; Future Estrogen deficiency - DG Bone Density; Future  E. Colorectal Cancer Screening: She has had colonoscopy and this is in epic so I reviewed that today. This was performed 01/01/2012. Normal. Repeat 10 years.  F. Immunizations:  Influenza:-------N/A---July Tetanus:------ she states that she has had no recent immunizations no tetanus in the past 10 years. She is not on Medicare yet. We'll go ahead and give Tdap today--Tdap given here 06/11/2017 Pneumococcal:--- She states that she has had no pneumonia vaccine. Given that she is age 23 recommend that she have Prevnar 80 today and then Pneumovax 23 in 6-12 months. She is agreeable.  -----------------------Prevnar 13-- given here 06/11/2017 Shingrix:---------Wrote this on AVS as reminder-- and discussed with her she is to call her insurance to check on coverage and cost and then let us know whether she wants this or not.      Will plan for routine follow-up visit 6 months. Follow-up sooner if needed.    [address] [place], [place], [facility] 12/20/2017 5:01 PM

## 2017-12-21 LAB — COMPLETE METABOLIC PANEL WITH GFR
AG RATIO: 1.8 (calc) (ref 1.0–2.5)
ALT: 13 U/L (ref 6–29)
AST: 15 U/L (ref 10–35)
Albumin: 4.4 g/dL (ref 3.6–5.1)
Alkaline phosphatase (APISO): 74 U/L (ref 33–130)
BILIRUBIN TOTAL: 0.9 mg/dL (ref 0.2–1.2)
BUN: 23 mg/dL (ref 7–25)
CHLORIDE: 102 mmol/L (ref 98–110)
CO2: 29 mmol/L (ref 20–32)
Calcium: 9.5 mg/dL (ref 8.6–10.4)
Creat: 0.75 mg/dL (ref 0.50–0.99)
GFR, EST NON AFRICAN AMERICAN: 83 mL/min/{1.73_m2} (ref >=60)
GFR, Est African American: 96 mL/min/{1.73_m2} (ref >=60)
GLOBULIN: 2.5 g/dL (ref 1.9–3.7)
Glucose, Bld: 100 mg/dL — ABNORMAL HIGH (ref 65–99)
POTASSIUM: 3.8 mmol/L (ref 3.5–5.3)
SODIUM: 140 mmol/L (ref 135–146)
Total Protein: 6.9 g/dL (ref 6.1–8.1)

## 2017-12-21 LAB — VITAMIN D 25 HYDROXY (VIT D DEFICIENCY, FRACTURES): Vit D, 25-Hydroxy: 24 ng/mL — ABNORMAL LOW (ref 30–100)

## 2017-12-21 LAB — EXTRA LAV TOP TUBE

## 2017-12-24 ENCOUNTER — Other Ambulatory Visit: Payer: Self-pay

## 2017-12-24 MED ORDER — VITAMIN D 50 MCG (2000 UT) PO TABS: 4000.0000 [IU] | ORAL_TABLET | Freq: Every day | ORAL | Status: DC

## 2018-01-02 ENCOUNTER — Other Ambulatory Visit: Payer: Self-pay | Admitting: Cardiovascular Disease

## 2018-01-02 NOTE — Telephone Encounter (Signed)
Rx has been sent to the pharmacy electronically. ° °

## 2018-01-03 ENCOUNTER — Other Ambulatory Visit: Payer: Self-pay

## 2018-01-03 MED ORDER — TRIAMTERENE-HCTZ 37.5-25 MG PO TABS: 1.0000 | ORAL_TABLET | Freq: Every day | ORAL | 0 refills | Status: DC

## 2018-02-04 ENCOUNTER — Ambulatory Visit: Payer: BC Managed Care – PPO | Admitting: Cardiovascular Disease

## 2018-02-04 ENCOUNTER — Encounter: Payer: Self-pay | Admitting: Cardiovascular Disease

## 2018-02-04 VITALS — BP 120/82 | HR 69 | Ht 63.0 in | Wt 201.4 lb

## 2018-02-04 DIAGNOSIS — I10 Essential (primary) hypertension: Secondary | ICD-10-CM | POA: Diagnosis not present

## 2018-02-04 DIAGNOSIS — E7800 Pure hypercholesterolemia, unspecified: Secondary | ICD-10-CM

## 2018-02-04 DIAGNOSIS — I872 Venous insufficiency (chronic) (peripheral): Secondary | ICD-10-CM | POA: Diagnosis not present

## 2018-02-04 DIAGNOSIS — I491 Atrial premature depolarization: Secondary | ICD-10-CM | POA: Diagnosis not present

## 2018-02-04 MED ORDER — TRIAMTERENE-HCTZ 37.5-25 MG PO TABS: 1.0000 | ORAL_TABLET | Freq: Every day | ORAL | 3 refills | Status: DC

## 2018-02-04 MED ORDER — SIMVASTATIN 20 MG PO TABS: 20.0000 mg | ORAL_TABLET | Freq: Every evening | ORAL | 3 refills | Status: DC

## 2018-02-04 NOTE — Progress Notes (Signed)
Patient ID: JANINE RELLER, female   DOB: 04-08-1951, 67 y.o.   MRN: [id number]    Cardiology Office Note    Date:  02/04/2018   ID:  EVANI SHRIDER, DOB 10/16/1951, MRN [id number]  PCP:  Orlena Sheldon, PA-C  Cardiologist:   Sanda Klein, MD   No chief complaint on file.   History of Present Illness:  CALANDRIA MULLINGS is a 67 y.o. female with Peripheral venous insufficiency mild hypertension, symptomatic premature atrial contractions and hyperlipidemia who returns for follow-up.  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  She has not had any worsening of her lower extremity edema which continues to be quite mild in the morning worse at the end of the day.  She has not had any skin ulcers.  Dyspnea or angina activity, has not been troubled by palpitations and has not experienced syncope or any focal neurological events.  She does not have intermittent claudication.  Past Medical History:  Diagnosis Date  . Hyperlipemia   . Hypertension   . Kidney stones before 01-05-12   states she has passed stones 4 times/ tsf  . Lower extremity edema   . Obesity     Past Surgical History:  Procedure Laterality Date  . BREAST BIOPSY Left   . CHOLECYSTECTOMY    . COLONOSCOPY  01/01/2012   Procedure: COLONOSCOPY;  Surgeon: Daneil Dolin, MD;  [facility];  Service: Endoscopy;  Laterality: N/A;  10:00 AM  . JOINT REPLACEMENT  2006   left knee  . US ECHOCARDIOGRAPHY  08/21/2011   LA mild to mod. dilated,mild MR,TR    Outpatient Medications Prior to Visit  Medication Sig Dispense Refill  . aspirin 81 MG tablet Take 81 mg by mouth daily.      . Cholecalciferol (VITAMIN D) 2000 units tablet Take 2 tablets (4,000 Units total) by mouth daily.    Marland Kitchen ibuprofen (ADVIL,MOTRIN) 200 MG tablet Take 200 mg by mouth every 6 (six) hours as needed. pain    . simvastatin  (ZOCOR) 20 MG tablet TAKE 1 TABLET (20 MG TOTAL) BY MOUTH EVERY EVENING. 90 tablet 3  . triamterene-hydrochlorothiazide (MAXZIDE-25) 37.5-25 MG tablet Take 1 tablet by mouth daily. 90 tablet 0   No facility-administered medications prior to visit.      Allergies:   Patient has no known allergies.   Social History   Socioeconomic History  . Marital status: Married    Spouse name: None  . Number of children: None  . Years of education: None  . Highest education level: None  Social Needs  . Financial resource strain: None  . Food insecurity - worry: None  . Food insecurity - inability: None  . Transportation needs - medical: None  . Transportation needs - non-medical: None  Occupational History  . None  Tobacco Use  . Smoking status: Never Smoker  . Smokeless tobacco: Never Used  Substance and Sexual Activity  . Alcohol use: No  . Drug use: No  . Sexual activity: None  Other Topics Concern  . None  Social History Narrative  . None     Family History:  The patient's family history includes Heart attack in her father; Hypertension in her father and mother.   ROS:   Please see the history of present illness.    ROS  All other systems reviewed and are negative.   PHYSICAL EXAM:   VS:  BP 120/82   Pulse 69   Ht 5\' 3"  (1.6 m)   Wt 201 lb 6.4 oz (91.4 kg)   BMI 35.68 kg/m     General: Alert, oriented x3, no distress, moderately obese Head: no evidence of trauma, PERRL, EOMI, no exophtalmos or lid lag, no myxedema, no xanthelasma; normal ears, nose and oropharynx Neck: normal jugular venous pulsations and no hepatojugular reflux; brisk carotid pulses without delay and no carotid bruits Chest: clear to auscultation, no signs of consolidation by percussion or palpation, normal fremitus, symmetrical and full respiratory excursions Cardiovascular: normal position and quality of the apical impulse, regular rhythm, normal first and second heart sounds, no murmurs, rubs or  gallops Abdomen: no tenderness or distention, no masses by palpation, no abnormal pulsatility or arterial bruits, normal bowel sounds, no hepatosplenomegaly Extremities: no clubbing, cyanosis, 1+ symmetrical ankle edema; 2+ radial, ulnar and brachial pulses bilaterally; 2+ right femoral, posterior tibial and dorsalis pedis pulses; 2+ left femoral, posterior tibial and dorsalis pedis pulses; no subclavian or femoral bruits Neurological: grossly nonfocal Psych: Normal mood and affect   Wt Readings from Last 3 Encounters:  02/04/18 201 lb 6.4 oz (91.4 kg)  12/20/17 199 lb 12.8 oz (90.6 kg)  06/11/17 198 lb 6.4 oz (90 kg)      Studies/Labs Reviewed:   EKG:  EKG is ordered today.  The ekg ordered today shows normal sinus rhythm, normal tracing Recent Labs: 06/11/2017: Hemoglobin 13.0; Platelets 225; TSH 1.43 12/20/2017: ALT 13; BUN 23; Creat 0.75; Potassium 3.8; Sodium 140   Lipid Panel    Component Value Date/Time   CHOL 187 06/11/2017 1124   TRIG 151 (H) 06/11/2017 1124   HDL 67 06/11/2017 1124   CHOLHDL 2.8 06/11/2017 1124   VLDL 30 06/11/2017 1124   LDLCALC 90 06/11/2017 1124     ASSESSMENT:    1. Essential hypertension   2. Hypercholesterolemia   3. PAC (premature atrial contractions)   4. Peripheral venous insufficiency     PLAN:  In order of problems listed above:  1. HTN: Controlled, continue same medications.  Avoid using ibuprofen as a scheduled medication, use only as needed. 2. HLP: Good lipid profile last summer, will be due for recheck in July 3. PACs: None detected today 4. Venous insufficiency : Legs elevated as much as possible, monitor for poor skin healing.  May improve after she does not to stand upright all day at school.   Medication Adjustments/Labs and Tests Ordered: Current medicines are reviewed at length with the patient today.  Concerns regarding medicines are outlined above.  Medication changes, Labs and Tests ordered today are listed in the  Patient Instructions below. Patient Instructions  Dr Sallyanne Kuster recommends that you schedule a follow-up appointment in 12 months. You will receive a reminder letter in the mail two months in advance. If you don't receive a letter, please call our office to schedule the follow-up appointment.  If you need a refill on your cardiac medications before your next appointment, please call your pharmacy.      Signed, Sanda Klein, MD  02/04/2018 8:07 AM    [facility] [address], [place], [place]  [address] Phone: [phone]; Fax: [phone]

## 2018-02-04 NOTE — Patient Instructions (Signed)
Dr Croitoru recommends that you schedule a follow-up appointment in 12 months. You will receive a reminder letter in the mail two months in advance. If you don't receive a letter, please call our office to schedule the follow-up appointment.  If you need a refill on your cardiac medications before your next appointment, please call your pharmacy. 

## 2019-02-14 ENCOUNTER — Encounter: Payer: Self-pay | Admitting: Cardiovascular Disease

## 2019-02-14 ENCOUNTER — Ambulatory Visit: Payer: Medicare Other | Admitting: Cardiovascular Disease

## 2019-02-14 VITALS — BP 134/73 | HR 69 | Ht 63.0 in | Wt 190.8 lb

## 2019-02-14 DIAGNOSIS — I491 Atrial premature depolarization: Secondary | ICD-10-CM

## 2019-02-14 DIAGNOSIS — I872 Venous insufficiency (chronic) (peripheral): Secondary | ICD-10-CM | POA: Diagnosis not present

## 2019-02-14 DIAGNOSIS — E785 Hyperlipidemia, unspecified: Secondary | ICD-10-CM

## 2019-02-14 DIAGNOSIS — I10 Essential (primary) hypertension: Secondary | ICD-10-CM | POA: Diagnosis not present

## 2019-02-14 MED ORDER — SIMVASTATIN 20 MG PO TABS: 20.0000 mg | ORAL_TABLET | Freq: Every evening | ORAL | 3 refills | Status: DC

## 2019-02-14 MED ORDER — TRIAMTERENE-HCTZ 37.5-25 MG PO TABS
1.0000 | ORAL_TABLET | Freq: Every day | ORAL | 3 refills | Status: DC
Start: 2019-02-14 — End: 2020-04-02

## 2019-02-14 NOTE — Progress Notes (Signed)
Patient ID: [name], female   DOB: 08-Jun-1951, 68 y.o.   MRN: [id number]    Cardiology Office Note    Date:  02/14/2019   ID:  [name], DOB 01-14-1951, MRN [id number]  PCP:  [name], MD  Cardiologist:   Sanda Klein, MD   Chief Complaint  Patient presents with  . Hypertension  . Hyperlipidemia  . Edema    History of Present Illness:  [name] is a 68 y.o. female with Peripheral venous insufficiency mild hypertension, symptomatic premature atrial contractions and hyperlipidemia who returns for follow-up.  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  Her leg edema has improved somewhat since then.  Consistently goes away after overnight rest and is worse at the end of the day.  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 The patient specifically denies any chest pain at rest exertion, dyspnea at rest or with exertion, orthopnea, paroxysmal nocturnal dyspnea, syncope, palpitations, focal neurological deficits, intermittent claudication, lower extremity edema, unexplained weight gain, cough, hemoptysis or wheezing.  She has lost 9 pounds since a year ago and is still trying to lose more weight.  She remains mildly obese.  Her new primary care provider will be Dr. Margaretmary Eddy, but she has not seen him yet.  Past Medical History:  Diagnosis Date  . Hyperlipemia   . Hypertension   . Kidney stones before 12/25/11   states she has passed stones 4 times/ tsf  . Lower extremity edema   . Obesity     Past Surgical History:  Procedure Laterality Date  . BREAST BIOPSY Left   . CHOLECYSTECTOMY    . COLONOSCOPY  01/01/2012   Procedure: COLONOSCOPY;  Surgeon: Daneil Dolin, MD;  [facility];  Service: Endoscopy;  Laterality: N/A;  10:00 AM  . JOINT REPLACEMENT  2006   left knee  . US ECHOCARDIOGRAPHY  08/21/2011   LA mild to mod. dilated,mild MR,TR    Outpatient Medications Prior to Visit  Medication Sig Dispense Refill    . aspirin 81 MG tablet Take 81 mg by mouth daily.      . Cholecalciferol (VITAMIN D) 2000 units tablet Take 2 tablets (4,000 Units total) by mouth daily.    Marland Kitchen ibuprofen (ADVIL,MOTRIN) 200 MG tablet Take 200 mg by mouth every 6 (six) hours as needed. pain    . simvastatin (ZOCOR) 20 MG tablet Take 1 tablet (20 mg total) by mouth every evening. 90 tablet 3  . triamterene-hydrochlorothiazide (MAXZIDE-25) 37.5-25 MG tablet Take 1 tablet by mouth daily. 90 tablet 3   No facility-administered medications prior to visit.      Allergies:   Patient has no known allergies.   Social History   Socioeconomic History  . Marital status: Married    Spouse name: Not on file  . Number of children: Not on file  . Years of education: Not on file  . Highest education level: Not on file  Occupational History  . Not on file  Social Needs  . Financial resource strain: Not on file  . Food insecurity:    Worry: Not on file    Inability: Not on file  . Transportation needs:    Medical: Not on file    Non-medical: Not on file  Tobacco Use  . Smoking status: Never Smoker  . Smokeless tobacco: Never Used  Substance and Sexual Activity  . Alcohol use: No  . Drug use: No  .  Sexual activity: Not on file  Lifestyle  . Physical activity:    Days per week: Not on file    Minutes per session: Not on file  . Stress: Not on file  Relationships  . Social connections:    Talks on phone: Not on file    Gets together: Not on file    Attends religious service: Not on file    Active member of club or organization: Not on file    Attends meetings of clubs or organizations: Not on file    Relationship status: Not on file  Other Topics Concern  . Not on file  Social History Narrative  . Not on file     Family History:  The patient's family history includes Heart attack in her father; Hypertension in her father and mother.   ROS:   Please see the history of present illness.    ROS All other systems are  reviewed and are negative  PHYSICAL EXAM:   VS:  BP 134/73   Pulse 69   Ht 5\' 3"  (1.6 m)   Wt 190 lb 12.8 oz (86.5 kg)   SpO2 98%   BMI 33.80 kg/m      General: Alert, oriented x3, no distress, mildly obese Head: no evidence of trauma, PERRL, EOMI, no exophtalmos or lid lag, no myxedema, no xanthelasma; normal ears, nose and oropharynx Neck: normal jugular venous pulsations and no hepatojugular reflux; brisk carotid pulses without delay and no carotid bruits Chest: clear to auscultation, no signs of consolidation by percussion or palpation, normal fremitus, symmetrical and full respiratory excursions Cardiovascular: normal position and quality of the apical impulse, regular rhythm, normal first and second heart sounds, no murmurs, rubs or gallops Abdomen: no tenderness or distention, no masses by palpation, no abnormal pulsatility or arterial bruits, normal bowel sounds, no hepatosplenomegaly Extremities: no clubbing, cyanosis; soft 1+ symmetrical ankle edema; 2+ radial, ulnar and brachial pulses bilaterally; 2+ right femoral, posterior tibial and dorsalis pedis pulses; 2+ left femoral, posterior tibial and dorsalis pedis pulses; no subclavian or femoral bruits Neurological: grossly nonfocal Psych: Normal mood and affect    Wt Readings from Last 3 Encounters:  02/14/19 190 lb 12.8 oz (86.5 kg)  02/04/18 201 lb 6.4 oz (91.4 kg)  12/20/17 199 lb 12.8 oz (90.6 kg)      Studies/Labs Reviewed:   EKG:  EKG ordered today shows normal sinus rhythm and is a normal tracing tracing Recent Labs: No results found for requested labs within last 8760 hours.   Lipid Panel    Component Value Date/Time   CHOL 187 06/11/2017 1124   TRIG 151 (H) 06/11/2017 1124   HDL 67 06/11/2017 1124   CHOLHDL 2.8 06/11/2017 1124   VLDL 30 06/11/2017 1124   LDLCALC 90 06/11/2017 1124     ASSESSMENT:    1. Essential hypertension   2. Hyperlipidemia, unspecified hyperlipidemia type   3. PAC  (premature atrial contraction)   4. Edema of both lower extremities due to peripheral venous insufficiency     PLAN:  In order of problems listed above:  1. HTN: Fair control.  Expect will be fully normal range if she loses more weight. 2. HLP: Due for repeat lipid profile.  Gave her a lab slip for fasting's to be drawn in [place]. 3. PACs: None detected today 4. Venous insufficiency : Legs elevated as much as possible, monitor for poor skin healing.  Has improved somewhat since she is not standing all day long.  Recommend compression stockings when upright.  Can follow-up with cardiology as needed.  Will call her with the results of her lipid profile when it is available.   Medication Adjustments/Labs and Tests Ordered: Current medicines are reviewed at length with the patient today.  Concerns regarding medicines are outlined above.  Medication changes, Labs and Tests ordered today are listed in the Patient Instructions below. Patient Instructions  Medication Instructions:  Continue same medications If you need a refill on your cardiac medications before your next appointment, please call your pharmacy.   Lab work: Fasting Lipid Panel and Cmet If you have labs (blood work) drawn today and your tests are completely normal, you will receive your results only by: Marland Kitchen MyChart Message (if you have MyChart) OR . A paper copy in the mail If you have any lab test that is abnormal or we need to change your treatment, we will call you to review the results.  Testing/Procedures: None ordered  Follow-Up: At [facility], you and your health needs are our priority.  As part of our continuing mission to provide you with exceptional heart care, we have created designated Provider Care Teams.  These Care Teams include your primary Cardiologist (physician) and Advanced Practice Providers (APPs -  Physician Assistants and Nurse Practitioners) who all work together to provide you with the care you  need, when you need it. . Follow up as needed          Signed, Sanda Klein, MD  02/14/2019 9:49 AM    [facility] [address], [place], [place]  [address] Phone: [phone]; Fax: [phone]

## 2019-02-14 NOTE — Patient Instructions (Signed)
Medication Instructions:  Continue same medications If you need a refill on your cardiac medications before your next appointment, please call your pharmacy.   Lab work: Fasting Lipid Panel and Cmet If you have labs (blood work) drawn today and your tests are completely normal, you will receive your results only by: Marland Kitchen MyChart Message (if you have MyChart) OR . A paper copy in the mail If you have any lab test that is abnormal or we need to change your treatment, we will call you to review the results.  Testing/Procedures: None ordered  Follow-Up: At [facility], you and your health needs are our priority.  As part of our continuing mission to provide you with exceptional heart care, we have created designated Provider Care Teams.  These Care Teams include your primary Cardiologist (physician) and Advanced Practice Providers (APPs -  Physician Assistants and Nurse Practitioners) who all work together to provide you with the care you need, when you need it. . Follow up as needed

## 2019-02-17 ENCOUNTER — Other Ambulatory Visit: Payer: Self-pay | Admitting: Cardiovascular Disease

## 2019-02-17 LAB — LIPID PANEL
Cholesterol: 169 mg/dL (ref <200)
HDL: 60 mg/dL (ref >=50)
LDL Cholesterol (Calc): 90 mg/dL (calc)
NON-HDL CHOLESTEROL (CALC): 109 mg/dL (ref <130)
Total CHOL/HDL Ratio: 2.8 (calc) (ref <5.0)
Triglycerides: 93 mg/dL (ref <150)

## 2019-02-17 LAB — COMPLETE METABOLIC PANEL WITH GFR
AG Ratio: 1.8 (calc) (ref 1.0–2.5)
ALT: 15 U/L (ref 6–29)
AST: 16 U/L (ref 10–35)
Albumin: 4.5 g/dL (ref 3.6–5.1)
Alkaline phosphatase (APISO): 69 U/L (ref 37–153)
BUN: 20 mg/dL (ref 7–25)
CO2: 27 mmol/L (ref 20–32)
CREATININE: 0.91 mg/dL (ref 0.50–0.99)
Calcium: 10 mg/dL (ref 8.6–10.4)
Chloride: 103 mmol/L (ref 98–110)
GFR, Est African American: 76 mL/min/{1.73_m2} (ref >=60)
GFR, Est Non African American: 65 mL/min/{1.73_m2} (ref >=60)
GLUCOSE: 101 mg/dL — AB (ref 65–99)
Globulin: 2.5 g/dL (calc) (ref 1.9–3.7)
Potassium: 4.5 mmol/L (ref 3.5–5.3)
Sodium: 139 mmol/L (ref 135–146)
Total Bilirubin: 1.3 mg/dL — ABNORMAL HIGH (ref 0.2–1.2)
Total Protein: 7 g/dL (ref 6.1–8.1)

## 2019-03-04 ENCOUNTER — Telehealth: Payer: Self-pay | Admitting: Cardiovascular Disease

## 2019-03-04 NOTE — Telephone Encounter (Signed)
The patient has been notified of the result and verbalized understanding.  All questions (if any) were answered. Raiford Simmonds, RN 03/04/2019 10:51 AM

## 2019-03-04 NOTE — Telephone Encounter (Signed)
Follow Up:    Returning [name]'s call from yesterday, concerning her lab results.

## 2020-02-09 ENCOUNTER — Other Ambulatory Visit: Payer: Self-pay | Admitting: Cardiovascular Disease

## 2020-03-04 ENCOUNTER — Other Ambulatory Visit: Payer: Self-pay | Admitting: Cardiovascular Disease

## 2020-03-24 ENCOUNTER — Other Ambulatory Visit: Payer: Self-pay | Admitting: Cardiovascular Disease

## 2020-04-02 ENCOUNTER — Ambulatory Visit (INDEPENDENT_AMBULATORY_CARE_PROVIDER_SITE_OTHER): Payer: Medicare PPO | Admitting: Family Medicine

## 2020-04-02 ENCOUNTER — Encounter: Payer: Self-pay | Admitting: Family Medicine

## 2020-04-02 ENCOUNTER — Other Ambulatory Visit: Payer: Self-pay

## 2020-04-02 VITALS — BP 128/74 | HR 86 | Temp 97.6°F | Resp 16 | Ht 63.0 in | Wt 195.0 lb

## 2020-04-02 DIAGNOSIS — E7800 Pure hypercholesterolemia, unspecified: Secondary | ICD-10-CM | POA: Diagnosis not present

## 2020-04-02 DIAGNOSIS — Z23 Encounter for immunization: Secondary | ICD-10-CM

## 2020-04-02 DIAGNOSIS — I10 Essential (primary) hypertension: Secondary | ICD-10-CM | POA: Diagnosis not present

## 2020-04-02 DIAGNOSIS — Z1231 Encounter for screening mammogram for malignant neoplasm of breast: Secondary | ICD-10-CM

## 2020-04-02 DIAGNOSIS — Z0000 Encounter for general adult medical examination without abnormal findings: Secondary | ICD-10-CM

## 2020-04-02 DIAGNOSIS — Z0001 Encounter for general adult medical examination with abnormal findings: Secondary | ICD-10-CM

## 2020-04-02 MED ORDER — TRIAMTERENE-HCTZ 37.5-25 MG PO TABS: 1.0000 | ORAL_TABLET | Freq: Every day | ORAL | 3 refills | Status: DC

## 2020-04-02 MED ORDER — SIMVASTATIN 20 MG PO TABS: 20.0000 mg | ORAL_TABLET | Freq: Every day | ORAL | 3 refills | Status: DC

## 2020-04-02 NOTE — Progress Notes (Signed)
Subjective:    Patient ID: [name], female    DOB: 1951-03-05, 69 y.o.   MRN: [id number]  HPI  Patient is a very pleasant 69 year old Caucasian female who presents today for a complete physical exam.  Her last colonoscopy was checked in 2013.  She is due for repeat colonoscopy in 2023.  Her last mammogram was 2018.  This is due today.  The patient is also due for Pneumovax 23 as she is already had Prevnar 13.  She had COVID-19 in early March.  Therefore she will be due for the Covid vaccination in early June.  She has never had a history of an abnormal Pap smear.  Given the fact that she is 69 years old, she would not require repeat Pap smear.  She is also due for the shingles vaccine.  She denies any falls, depression, or memory loss. Past Medical History:  Diagnosis Date  . Hyperlipemia   . Hypertension   . Kidney stones before January 09, 2012   states she has passed stones 4 times/ tsf  . Lower extremity edema   . Obesity    Past Surgical History:  Procedure Laterality Date  . BREAST BIOPSY Left   . CHOLECYSTECTOMY    . COLONOSCOPY  01/01/2012   Procedure: COLONOSCOPY;  Surgeon: Daneil Dolin, MD;  [facility];  Service: Endoscopy;  Laterality: N/A;  10:00 AM  . JOINT REPLACEMENT  2006   left knee  . US ECHOCARDIOGRAPHY  08/21/2011   LA mild to mod. dilated,mild MR,TR   Current Outpatient Medications on File Prior to Visit  Medication Sig Dispense Refill  . aspirin 81 MG tablet Take 81 mg by mouth daily.      . Cholecalciferol (VITAMIN D) 2000 units tablet Take 2 tablets (4,000 Units total) by mouth daily.    Marland Kitchen ibuprofen (ADVIL,MOTRIN) 200 MG tablet Take 200 mg by mouth every 6 (six) hours as needed. pain     No current facility-administered medications on file prior to visit.   No Known Allergies Social History   Socioeconomic History  . Marital status: Married    Spouse name: Not on file  . Number of children: Not on file  . Years of education: Not on file   . Highest education level: Not on file  Occupational History  . Not on file  Tobacco Use  . Smoking status: Never Smoker  . Smokeless tobacco: Never Used  Substance and Sexual Activity  . Alcohol use: No  . Drug use: No  . Sexual activity: Not on file  Other Topics Concern  . Not on file  Social History Narrative  . Not on file   Social Determinants of Health   Financial Resource Strain:   . Difficulty of Paying Living Expenses:   Food Insecurity:   . Worried About Charity fundraiser in the Last Year:   . Arboriculturist in the Last Year:   Transportation Needs:   . Film/video editor (Medical):   Marland Kitchen Lack of Transportation (Non-Medical):   Physical Activity:   . Days of Exercise per Week:   . Minutes of Exercise per Session:   Stress:   . Feeling of Stress :   Social Connections:   . Frequency of Communication with Friends and Family:   . Frequency of Social Gatherings with Friends and Family:   . Attends Religious Services:   . Active Member of Clubs or Organizations:   . Attends Club or  Organization Meetings:   Marland Kitchen Marital Status:   Intimate Partner Violence:   . Fear of Current or Ex-Partner:   . Emotionally Abused:   Marland Kitchen Physically Abused:   . Sexually Abused:      Review of Systems  All other systems reviewed and are negative.      Objective:   Physical Exam Vitals reviewed.  Constitutional:      General: She is not in acute distress.    Appearance: Normal appearance. She is normal weight. She is not ill-appearing, toxic-appearing or diaphoretic.  HENT:     Head: Normocephalic and atraumatic.     Right Ear: Tympanic membrane, ear canal and external ear normal. There is no impacted cerumen.     Left Ear: Tympanic membrane, ear canal and external ear normal. There is no impacted cerumen.     Nose: Nose normal. No congestion or rhinorrhea.     Mouth/Throat:     Mouth: Mucous membranes are moist.     Pharynx: Oropharynx is clear. No oropharyngeal  exudate or posterior oropharyngeal erythema.  Eyes:     General: No scleral icterus.       Right eye: No discharge.        Left eye: No discharge.     Extraocular Movements: Extraocular movements intact.     Conjunctiva/sclera: Conjunctivae normal.     Pupils: Pupils are equal, round, and reactive to light.  Neck:     Vascular: No carotid bruit.  Cardiovascular:     Rate and Rhythm: Normal rate and regular rhythm.     Pulses: Normal pulses.     Heart sounds: Normal heart sounds. No murmur. No friction rub. No gallop.   Pulmonary:     Effort: Pulmonary effort is normal. No respiratory distress.     Breath sounds: Normal breath sounds. No stridor. No wheezing, rhonchi or rales.  Chest:     Chest wall: No tenderness.  Abdominal:     General: Abdomen is flat. Bowel sounds are normal. There is no distension.     Palpations: Abdomen is soft. There is no mass.     Tenderness: There is no abdominal tenderness. There is no right CVA tenderness, left CVA tenderness, guarding or rebound.     Hernia: No hernia is present.  Musculoskeletal:     Cervical back: Normal range of motion and neck supple. No rigidity.     Right lower leg: No edema.     Left lower leg: No edema.  Lymphadenopathy:     Cervical: No cervical adenopathy.  Skin:    General: Skin is warm.     Coloration: Skin is not jaundiced or pale.     Findings: No bruising, erythema, lesion or rash.  Neurological:     General: No focal deficit present.     Mental Status: She is alert. Mental status is at baseline.     Cranial Nerves: No cranial nerve deficit.     Sensory: No sensory deficit.     Motor: No weakness.     Coordination: Coordination normal.     Gait: Gait normal.     Deep Tendon Reflexes: Reflexes normal.  Psychiatric:        Mood and Affect: Mood normal.        Behavior: Behavior normal.        Thought Content: Thought content normal.        Judgment: Judgment normal.           Assessment & Plan:  Encounter for screening mammogram for malignant neoplasm of breast - Plan: MM Digital Screening  Pure hypercholesterolemia - Plan: CBC with Differential/Platelet, COMPLETE METABOLIC PANEL WITH GFR, Lipid panel  Essential hypertension  General medical exam  Patient's physical exam today is normal.  Her blood pressure is excellent.  I will schedule the patient for mammogram.  She does not require Pap smear.  She is not due yet for repeat colonoscopy.  She denies any falls, depression, or memory loss.  I will check a CBC, CMP, and fasting lipid panel.  Regular anticipatory guidance is provided.

## 2020-04-02 NOTE — Addendum Note (Signed)
Addended by: Shary Decamp B on: 04/02/2020 04:49 PM   Modules accepted: Orders

## 2020-04-05 ENCOUNTER — Other Ambulatory Visit: Payer: Self-pay

## 2020-04-05 ENCOUNTER — Other Ambulatory Visit: Payer: Medicare PPO

## 2020-04-05 LAB — COMPLETE METABOLIC PANEL WITH GFR
AG Ratio: 1.8 (calc) (ref 1.0–2.5)
ALT: 16 U/L (ref 6–29)
AST: 19 U/L (ref 10–35)
Albumin: 4.7 g/dL (ref 3.6–5.1)
Alkaline phosphatase (APISO): 71 U/L (ref 37–153)
BUN: 18 mg/dL (ref 7–25)
CO2: 28 mmol/L (ref 20–32)
Calcium: 10.6 mg/dL — ABNORMAL HIGH (ref 8.6–10.4)
Chloride: 102 mmol/L (ref 98–110)
Creat: 0.86 mg/dL (ref 0.50–0.99)
GFR, Est African American: 80 mL/min/{1.73_m2} (ref >=60)
GFR, Est Non African American: 69 mL/min/{1.73_m2} (ref >=60)
Globulin: 2.6 g/dL (calc) (ref 1.9–3.7)
Glucose, Bld: 94 mg/dL (ref 65–99)
Potassium: 4.5 mmol/L (ref 3.5–5.3)
Sodium: 140 mmol/L (ref 135–146)
Total Bilirubin: 1.5 mg/dL — ABNORMAL HIGH (ref 0.2–1.2)
Total Protein: 7.3 g/dL (ref 6.1–8.1)

## 2020-04-05 LAB — CBC WITH DIFFERENTIAL/PLATELET
Absolute Monocytes: 386 cells/uL (ref 200–950)
Basophils Absolute: 46 cells/uL (ref 0–200)
Basophils Relative: 0.5 %
Eosinophils Absolute: 276 cells/uL (ref 15–500)
Eosinophils Relative: 3 %
HCT: 40.8 % (ref 35.0–45.0)
Hemoglobin: 13.5 g/dL (ref 11.7–15.5)
Lymphs Abs: 2594 cells/uL (ref 850–3900)
MCH: 27.6 pg (ref 27.0–33.0)
MCHC: 33.1 g/dL (ref 32.0–36.0)
MCV: 83.4 fL (ref 80.0–100.0)
MPV: 9.9 fL (ref 7.5–12.5)
Monocytes Relative: 4.2 %
Neutro Abs: 5897 cells/uL (ref 1500–7800)
Neutrophils Relative %: 64.1 %
Platelets: 253 10*3/uL (ref 140–400)
RBC: 4.89 10*6/uL (ref 3.80–5.10)
RDW: 13.9 % (ref 11.0–15.0)
Total Lymphocyte: 28.2 %
WBC: 9.2 10*3/uL (ref 3.8–10.8)

## 2020-04-05 LAB — LIPID PANEL
Cholesterol: 208 mg/dL — ABNORMAL HIGH (ref <200)
HDL: 66 mg/dL (ref >=50)
LDL Cholesterol (Calc): 116 mg/dL (calc) — ABNORMAL HIGH
Non-HDL Cholesterol (Calc): 142 mg/dL (calc) — ABNORMAL HIGH (ref <130)
Total CHOL/HDL Ratio: 3.2 (calc) (ref <5.0)
Triglycerides: 138 mg/dL (ref <150)

## 2020-04-08 ENCOUNTER — Other Ambulatory Visit (HOSPITAL_COMMUNITY): Payer: Self-pay | Admitting: Family Medicine

## 2020-04-08 ENCOUNTER — Ambulatory Visit (HOSPITAL_COMMUNITY)
Admission: RE | Admit: 2020-04-08 | Discharge: 2020-04-08 | Disposition: A | Discharge status: Home / Self Care | Payer: Medicare PPO | Source: Ambulatory Visit | Attending: Family Medicine | Admitting: Family Medicine

## 2020-04-08 ENCOUNTER — Other Ambulatory Visit: Payer: Self-pay

## 2020-04-08 DIAGNOSIS — Z1231 Encounter for screening mammogram for malignant neoplasm of breast: Secondary | ICD-10-CM

## 2020-04-08 DIAGNOSIS — R928 Other abnormal and inconclusive findings on diagnostic imaging of breast: Secondary | ICD-10-CM

## 2020-04-27 ENCOUNTER — Ambulatory Visit (HOSPITAL_COMMUNITY)
Admission: RE | Admit: 2020-04-27 | Discharge: 2020-04-27 | Disposition: A | Discharge status: Home / Self Care | Payer: Medicare PPO | Source: Ambulatory Visit | Attending: Family Medicine | Admitting: Family Medicine

## 2020-04-27 ENCOUNTER — Other Ambulatory Visit: Payer: Self-pay

## 2020-04-27 ENCOUNTER — Other Ambulatory Visit (HOSPITAL_COMMUNITY): Payer: Self-pay | Admitting: Family Medicine

## 2020-04-27 DIAGNOSIS — R928 Other abnormal and inconclusive findings on diagnostic imaging of breast: Secondary | ICD-10-CM

## 2020-05-04 ENCOUNTER — Other Ambulatory Visit: Payer: Self-pay

## 2020-05-04 ENCOUNTER — Other Ambulatory Visit (HOSPITAL_COMMUNITY): Payer: Self-pay | Admitting: Family Medicine

## 2020-05-04 ENCOUNTER — Ambulatory Visit (HOSPITAL_COMMUNITY)
Admission: RE | Admit: 2020-05-04 | Discharge: 2020-05-04 | Disposition: A | Discharge status: Home / Self Care | Payer: Medicare PPO | Source: Ambulatory Visit | Attending: Family Medicine | Admitting: Family Medicine

## 2020-05-04 DIAGNOSIS — R928 Other abnormal and inconclusive findings on diagnostic imaging of breast: Secondary | ICD-10-CM

## 2020-05-04 MED ORDER — LIDOCAINE-EPINEPHRINE (PF) 1 %-1:200000 IJ SOLN
INTRAMUSCULAR | Status: AC
  Filled 2020-05-04: qty 30

## 2020-05-04 MED ORDER — LIDOCAINE HCL (PF) 2 % IJ SOLN
INTRAMUSCULAR | Status: AC
  Filled 2020-05-04: qty 10

## 2020-05-06 ENCOUNTER — Encounter: Payer: Self-pay | Admitting: Family Medicine

## 2020-05-06 DIAGNOSIS — C50919 Malignant neoplasm of unspecified site of unspecified female breast: Secondary | ICD-10-CM | POA: Insufficient documentation

## 2020-05-06 LAB — SURGICAL PATHOLOGY

## 2020-05-14 ENCOUNTER — Ambulatory Visit: Payer: Self-pay | Admitting: Surgery

## 2020-05-14 DIAGNOSIS — Z170 Estrogen receptor positive status [ER+]: Secondary | ICD-10-CM

## 2020-05-14 DIAGNOSIS — C50911 Malignant neoplasm of unspecified site of right female breast: Secondary | ICD-10-CM | POA: Diagnosis not present

## 2020-05-14 NOTE — H&P (Signed)
[name] Appointment: 05/14/2020 9:40 AM Location: [facility] Patient #: [id number] DOB: November 19, 1951 Married / Language: Cleophus Molt / Race: White Female  History of Present Illness Marcello Moores A. Tywaun Hiltner MD; 05/14/2020 1:41 PM) Patient words: Patient presents for evaluation of a right breast cancer. Diagnosed recent screening mammogram. She had an 8 mm abnormality right upper outer quadrant biopsy proven to be invasive ductal carcinoma grade 2. Markers are pending. She has no other complaints. Denies family history of breast cancer had an open left breast biopsy many years ago which was benign. Denies any history of breast pain, mass or discharge          FINAL MICROSCOPIC DIAGNOSIS:  A. BREAST, RIGHT/6:00, BIOPSY: - Invasive ductal carcinoma. - Ductal carcinoma in situ. - Complex sclerosing lesion.  COMMENT:  The carcinoma appears grade 2.  The greatest linear extent of tumor in any one core is 7 mm.  Ancillary studies will be reported separately.  Results reported to Electa Sniff on 05/05/2020.  Intradepartmental consultation (Dr. Lyndon Code).  GROSS DESCRIPTION:  The specimen is received in formalin labeled with the patient's name and right breast mass and consists of 3 cores of tan-yellow fibroadipose tissue, ranging from 1.2 x 0.2 x 0.2 cm to 1.8 x 0.2 x 0.2 cm. The specimen is entirely submitted in 1 cassette. Time in formalin at 8:14 AM on 05/04/2020. Cold ischemic time less than 1 minute. Craig Staggers 05/06/2020)    Final Diagnosis performed by Gillie Manners, MD. Electronically signed 05/05/2020 Technical component performed at [facility], [place] [address], [place], [place] [address]. Professional component performed at [employer]. [facility], [place] [address], [place], [place] [address]. Immunohistochemistry Technical component (if applicable) was performed at [facility]. [address] [place], [place], [place] [address]. IMMUNOHISTOCHEMISTRY DISCLAIMER (if applicable): Some of these immunohistochemical stains may have been developed and the performance characteristics determine by [facility]. Some may not have been cleared or approved by the U.S. Food and Drug Administration. The FDA has determined that such clearance or approval is not necessary. This test is used for clinical purposes. It should not be regarded as investigational or for research. This laboratory is certified under the [place] (CLIA-88) as qualified to perform high complexity clinical laboratory testing. The controls stained appropriately.  ADDENDUM:  PROGNOSTIC INDICATOR RESULTS:  Immunohistochemical and morphometric analysis performed manually  The tumor cells are NEGATIVE for Her2 (1+).  Estrogen Receptor: POSITIVE, 90% STRONG STAINING Progesterone Receptor: POSITIVE, 90% STRONG STAINING Proliferation Marker Ki-67: 10%  Comment: The negative hormone receptor study(ies) in the case has an internal positive control.  Reference Range Estrogen and Progesterone Receptor Negative 0% Positive >1%  All controls stained appropriately.          Addendum #1 performed by Enid Cutter, MD. Electronically signed 05/06/2020 Technical component performed at [facility], [place] [address], [place], [place] [address]. Professional component performed at [employer]. [facility], [place] [address], [place], [place] [address]. Immunohistochemistry Technical component (if applicable) was performed at [facility]. [address], [place], [place], [place] [address]. IMMUNOHISTOCHEMISTRY DISCLAIMER (if applicable): Some of these immunohistochemical stains may have been developed and the performance characteristics determine by [facility]. Some may not have been cleared or  approved by the U.S. Food and Drug Administration. The FDA has determined that such clearance or approval is not necessary. This test is used for clinical purposes. It  should not be regarded as investigational or for research. This laboratory is certified under the [place] (CLIA-88) as qualified to perform high complexity clinical laboratory testing. The controls stained appropriately.          69 year old female presenting as a recall from screening for possible right breast distortion. No history of breast surgery. EXAM: DIGITAL DIAGNOSTIC RIGHT MAMMOGRAM WITH TOMO ULTRASOUND RIGHT BREAST COMPARISON: Previous exam(s). ACR Breast Density Category b: There are scattered areas of fibroglandular density. FINDINGS: Mammogram: Spot compression tomosynthesis views of the right breast were performed. There is persistence of architectural distortion in the lower central to slightly outer right breast. The area of distortion spans approximately 0.9 cm. Ultrasound: Targeted ultrasound is performed in the right breast at 6 o'clock 1 cm from the nipple demonstrating an irregular hypoechoic mass measuring 0.8 x 0.6 x 0.6 cm. The surrounding tissues are distorted. No internal blood flow identified. Targeted ultrasound of the right axilla demonstrates normal-appearing lymph nodes. IMPRESSION: Right breast mass at 6 o'clock measuring 0.8 cm is suspicious and likely corresponds to the distortion seen mammographically. RECOMMENDATION: Ultrasound-guided core needle biopsy of the right breast mass at 6 o'clock. I have discussed the findings and recommendations with the patient. If applicable, a reminder letter will be sent to the patient regarding the next appointment. BI-RADS CATEGORY 4: Suspicious. Electronically Signed By: Audie Pinto M.D. On: 04/27/2020 10:02  Result History  MM DIAG BREAST TOMO UNI RIGHT (Order #[id number]) on  04/27/2020 - Order Result History Report <[email or web address]>  US BREAST LTD UNI RIGHT INC AXILLA (Order [id number]) - Reflex for Order [id number] Study Result  CLINICAL DATA: 69 year old female presenting as a recall from screening for possible right breast distortion. No history of breast surgery. EXAM: DIGITAL DIAGNOSTIC RIGHT MAMMOGRAM WITH TOMO ULTRASOUND RIGHT BREAST COMPARISON: Previous exam(s). ACR Breast Density Category b: There are scattered areas of fibroglandular density. FINDINGS: Mammogram: Spot compression tomosynthesis views of the right breast were performed. There is persistence of architectural distortion in the lower central to slightly outer right breast. The area of distortion spans approximately 0.9 cm. Ultrasound: Targeted ultrasound is performed in the right breast at 6 o'clock 1 cm from the nipple demonstrating an irregular hypoechoic mass measuring 0.8 x 0.6 x 0.6 cm. The surrounding tissues are distorted. No internal blood flow identified. Targeted ultrasound of the right axilla demonstrates normal-appearing lymph nodes. IMPRESSION: Right breast mass at 6 o'clock measuring 0.8 cm is suspicious and likely corresponds to the distortion seen mammographically. RECOMMENDATION: Ultrasound-guided core needle biopsy of the right breast mass at 6 o'clock. I have discussed the findings and recommendations with the patient. If applicable, a reminder letter will be sent to the patient regarding the next appointment. BI-RADS CATEGORY 4: Suspicious. Electronically Signed By: Audie Pinto M.D. On: 04/27/2020 10:02  Result History  US BREAST LTD UNI RIGHT INC AXILLA (Order #[id number]) on 04/27/2020 - Order Result History Report <[email or web address]>  MM Digital Screening (Order [id number]) Study Result  CLINICAL DATA: Screening. EXAM: DIGITAL SCREENING BILATERAL MAMMOGRAM WITH CAD COMPARISON: Previous exam(s). ACR Breast Density  Category b: There are scattered areas of fibroglandular density. FINDINGS: In the right breast, possible distortion warrants further evaluation. In the left breast, no findings suspicious for malignancy. Images were processed with CAD. IMPRESSION: Further evaluation is suggested for possible distortion in the right breast. RECOMMENDATION: Diagnostic mammogram and possibly ultrasound of the right breast. (Code:[id number]) The patient will be contacted regarding the findings, and additional imaging will be scheduled. BI-RADS CATEGORY  0: Incomplete. Need additional imaging evaluation and/or prior mammograms for comparison. Electronically Signed By: Lovey Newcomer M.D. On: 04/08/2020 09:05 .  The patient is a 69 year old female.   Past Surgical History (Chanel Teressa Senter, [place]; 05/14/2020 9:55 AM) Breast Biopsy Bilateral. Gallbladder Surgery - Laparoscopic Knee Surgery Left.  Diagnostic Studies History (Chanel Teressa Senter, CMA; 05/14/2020 9:55 AM) Colonoscopy 5-10 years ago Mammogram within last year Pap Smear 1-5 years ago  Allergies (Segundo, CMA; 05/14/2020 9:56 AM) No Known Drug Allergies [05/14/2020]: Allergies Reconciled  Medication History (Chanel Teressa Senter, CMA; 05/14/2020 9:56 AM) Simvastatin (20MG Tablet, Oral) Active. Triamterene-HCTZ (37.5-25MG Tablet, Oral) Active. Vitamin D (Oral) Specific strength unknown - Active. Aspirin (81MG Tablet, Oral) Active. Medications Reconciled  Social History (Chanel Teressa Senter, CMA; 05/14/2020 9:55 AM) Caffeine use Carbonated beverages, Coffee. No alcohol use No drug use Tobacco use Never smoker.  Family History Antonietta Jewel, [place]; 05/14/2020 9:55 AM) Arthritis Mother. Colon Cancer Mother. Colon Polyps Mother. Heart Disease Father. Heart disease in female family member before age 57 Hypertension Father, Mother. Melanoma Father. Migraine Headache Daughter. Ovarian Cancer Daughter. Respiratory Condition Mother. Thyroid  problems Daughter.  Pregnancy / Birth History Antonietta Jewel, [place]; 05/14/2020 9:55 AM) Age of menopause 52-60 Gravida 2 Maternal age 15-20 Para 2  Other Problems (Chanel Teressa Senter, CMA; 05/14/2020 9:55 AM) Arthritis Cholelithiasis High blood pressure Kidney Stone     Review of Systems (Chanel Nolan CMA; 05/14/2020 9:55 AM) General Not Present- Appetite Loss, Chills, Fatigue, Fever, Night Sweats, Weight Gain and Weight Loss. Skin Not Present- Change in Wart/Mole, Dryness, Hives, Jaundice, New Lesions, Non-Healing Wounds, Rash and Ulcer. HEENT Not Present- Earache, Hearing Loss, Hoarseness, Nose Bleed, Oral Ulcers, Ringing in the Ears, Seasonal Allergies, Sinus Pain, Sore Throat, Visual Disturbances, Wears glasses/contact lenses and Yellow Eyes. Respiratory Present- Snoring. Not Present- Bloody sputum, Chronic Cough, Difficulty Breathing and Wheezing. Breast Present- Breast Mass. Not Present- Breast Pain, Nipple Discharge and Skin Changes. Cardiovascular Present- Swelling of Extremities. Not Present- Chest Pain, Difficulty Breathing Lying Down, Leg Cramps, Palpitations, Rapid Heart Rate and Shortness of Breath. Gastrointestinal Present- Indigestion. Not Present- Abdominal Pain, Bloating, Bloody Stool, Change in Bowel Habits, Chronic diarrhea, Constipation, Difficulty Swallowing, Excessive gas, Gets full quickly at meals, Hemorrhoids, Nausea, Rectal Pain and Vomiting. Female Genitourinary Not Present- Frequency, Nocturia, Painful Urination, Pelvic Pain and Urgency. Musculoskeletal Present- Joint Pain, Joint Stiffness and Swelling of Extremities. Not Present- Back Pain, Muscle Pain and Muscle Weakness. Neurological Not Present- Decreased Memory, Fainting, Headaches, Numbness, Seizures, Tingling, Tremor, Trouble walking and Weakness. Psychiatric Not Present- Anxiety, Bipolar, Change in Sleep Pattern, Depression, Fearful and Frequent crying. Endocrine Not Present- Cold Intolerance, Excessive  Hunger, Hair Changes, Heat Intolerance, Hot flashes and New Diabetes. Hematology Not Present- Blood Thinners, Easy Bruising, Excessive bleeding, Gland problems, HIV and Persistent Infections.  Vitals (Chanel Nolan CMA; 05/14/2020 9:57 AM) 05/14/2020 9:56 AM Weight: 191.5 lb Height: 64in Body Surface Area: 1.92 m Body Mass Index: 32.87 kg/m  Temp.: 97.53F  Pulse: 96 (Regular)  BP: 126/82(Sitting, Left Arm, Standard)        Physical Exam (Daxter Paule A. Skylarr Liz MD; 05/14/2020 1:41 PM)  General Mental Status-Alert. General Appearance-Consistent with stated age. Hydration-Well hydrated. Voice-Normal.  Head and Neck Head-normocephalic, atraumatic with no lesions or palpable masses. Trachea-midline. Thyroid Gland Characteristics - normal size and consistency.  Eye Eyeball - Bilateral-Extraocular movements intact. Sclera/Conjunctiva - Bilateral-No scleral icterus.  Chest and Lung Exam Chest and lung exam reveals -quiet, even and easy respiratory effort with no use of accessory muscles  and on auscultation, normal breath sounds, no adventitious sounds and normal vocal resonance. Inspection Chest Wall - Normal. Back - normal.  Breast Breast - Left-Symmetric, Non Tender, No Biopsy scars, no Dimpling - Left, No Inflammation, No Lumpectomy scars, No Mastectomy scars, No Peau d' Orange. Breast - Right-Symmetric, Non Tender, No Biopsy scars, no Dimpling - Right, No Inflammation, No Lumpectomy scars, No Mastectomy scars, No Peau d' Orange. Breast Lump-No Palpable Breast Mass.  Cardiovascular Cardiovascular examination reveals -normal heart sounds, regular rate and rhythm with no murmurs and normal pedal pulses bilaterally.  Abdomen Inspection Inspection of the abdomen reveals - No Hernias. Skin - Scar - no surgical scars. Palpation/Percussion Palpation and Percussion of the abdomen reveal - Soft, Non Tender, No Rebound tenderness, No Rigidity (guarding)  and No hepatosplenomegaly. Auscultation Auscultation of the abdomen reveals - Bowel sounds normal.  Neurologic Neurologic evaluation reveals -alert and oriented x 3 with no impairment of recent or remote memory. Mental Status-Normal.  Musculoskeletal Normal Exam - Left-Upper Extremity Strength Normal and Lower Extremity Strength Normal. Normal Exam - Right-Upper Extremity Strength Normal and Lower Extremity Strength Normal.  Lymphatic Head & Neck  General Head & Neck Lymphatics: Bilateral - Description - Normal. Axillary  General Axillary Region: Bilateral - Description - Normal. Tenderness - Non Tender. Femoral & Inguinal  Generalized Femoral & Inguinal Lymphatics: Bilateral - Description - Normal. Tenderness - Non Tender.    Assessment & Plan (Veronda Gabor A. Brewster Wolters MD; 05/14/2020 1:42 PM)  BREAST CANCER, RIGHT (C50.911) Impression: Patient has opted for right breast seed lumpectomy and right axillary sentinel lymph node mapping. Referral to medical oncology, radiation oncology Risk of lumpectomy include bleeding, infection, seroma, more surgery, use of seed/wire, wound care, cosmetic deformity and the need for other treatments, death , blood clots, death. Pt agrees to proceed. Risk of sentinel lymph node mapping include bleeding, infection, lymphedema, shoulder pain. stiffness, dye allergy. cosmetic deformity , blood clots, death, need for more surgery. Pt agrees to proceed.   Total time 45 minutes  Current Plans You are being scheduled for surgery- Our schedulers will call you.  You should hear from our office's scheduling department within 5 working days about the location, date, and time of surgery. We try to make accommodations for patient's preferences in scheduling surgery, but sometimes the OR schedule or the surgeon's schedule prevents [place] from making those accommodations.  If you have not heard from our office [phone]) in 5 working days, call the office and  ask for your surgeon's nurse.  If you have other questions about your diagnosis, plan, or surgery, call the office and ask for your surgeon's nurse.  We discussed the staging and pathophysiology of breast cancer. We discussed all of the different options for treatment for breast cancer including surgery, chemotherapy, radiation therapy, Herceptin, and antiestrogen therapy. We discussed a sentinel lymph node biopsy as she does not appear to having lymph node involvement right now. We discussed the performance of that with injection of radioactive tracer and blue dye. We discussed that she would have an incision underneath her axillary hairline. We discussed that there is a bout a 10-20% chance of having a positive node with a sentinel lymph node biopsy and we will await the permanent pathology to make any other first further decisions in terms of her treatment. One of these options might be to return to the operating room to perform an axillary lymph node dissection. We discussed about a 1-2% risk lifetime of chronic shoulder pain as well  as lymphedema associated with a sentinel lymph node biopsy. We discussed the options for treatment of the breast cancer which included lumpectomy versus a mastectomy. We discussed the performance of the lumpectomy with a wire placement. We discussed a 10-20% chance of a positive margin requiring reexcision in the operating room. We also discussed that she may need radiation therapy or antiestrogen therapy or both if she undergoes lumpectomy. We discussed the mastectomy and the postoperative care for that as well. We discussed that there is no difference in her survival whether she undergoes lumpectomy with radiation therapy or antiestrogen therapy versus a mastectomy. There is a slight difference in the local recurrence rate being 3-5% with lumpectomy and about 1% with a mastectomy. We discussed the risks of operation including bleeding, infection, possible reoperation. She  understands her further therapy will be based on what her stages at the time of her operation.  Pt Education - [facility]b breast cancer surgery: discussed with patient and provided information. Pt Education - [facility] Breast Biopsy HCI: discussed with patient and provided information. Pt Education - ABC (After Breast Cancer) Class Info: discussed with patient and provided information.

## 2020-05-14 NOTE — H&P (View-Only) (Signed)
Bonna Gains Appointment: 05/14/2020 9:40 AM Location: [facility] Patient #: [id number] DOB: 1951-09-08 Married / Language: Cleophus Molt / Race: White Female  History of Present Illness Marcello Moores A. Jazira Maloney MD; 05/14/2020 1:41 PM) Patient words: Patient presents for evaluation of a right breast cancer. Diagnosed recent screening mammogram. She had an 8 mm abnormality right upper outer quadrant biopsy proven to be invasive ductal carcinoma grade 2. Markers are pending. She has no other complaints. Denies family history of breast cancer had an open left breast biopsy many years ago which was benign. Denies any history of breast pain, mass or discharge          FINAL MICROSCOPIC DIAGNOSIS:  A. BREAST, RIGHT/6:00, BIOPSY: - Invasive ductal carcinoma. - Ductal carcinoma in situ. - Complex sclerosing lesion.  COMMENT:  The carcinoma appears grade 2.  The greatest linear extent of tumor in any one core is 7 mm.  Ancillary studies will be reported separately.  Results reported to Electa Sniff on 05/05/2020.  Intradepartmental consultation (Dr. Lyndon Code).  GROSS DESCRIPTION:  The specimen is received in formalin labeled with the patient's name and right breast mass and consists of 3 cores of tan-yellow fibroadipose tissue, ranging from 1.2 x 0.2 x 0.2 cm to 1.8 x 0.2 x 0.2 cm. The specimen is entirely submitted in 1 cassette. Time in formalin at 8:14 AM on 05/04/2020. Cold ischemic time less than 1 minute. Craig Staggers 05/06/2020)    Final Diagnosis performed by Gillie Manners, MD. Electronically signed 05/05/2020 Technical component performed at [facility], [place] [address], [place], [place] [address]. Professional component performed at [employer]. [facility], [place] [address], [place], [place] [address]. Immunohistochemistry Technical component (if applicable) was performed at [facility]. [address], [place], [place], [place] [address]. IMMUNOHISTOCHEMISTRY DISCLAIMER (if applicable): Some of these immunohistochemical stains may have been developed and the performance characteristics determine by [facility]. Some may not have been cleared or approved by the U.S. Food and Drug Administration. The FDA has determined that such clearance or approval is not necessary. This test is used for clinical purposes. It should not be regarded as investigational or for research. This laboratory is certified under the [place] (CLIA-88) as qualified to perform high complexity clinical laboratory testing. The controls stained appropriately.  ADDENDUM:  PROGNOSTIC INDICATOR RESULTS:  Immunohistochemical and morphometric analysis performed manually  The tumor cells are NEGATIVE for Her2 (1+).  Estrogen Receptor: POSITIVE, 90% STRONG STAINING Progesterone Receptor: POSITIVE, 90% STRONG STAINING Proliferation Marker Ki-67: 10%  Comment: The negative hormone receptor study(ies) in the case has an internal positive control.  Reference Range Estrogen and Progesterone Receptor Negative 0% Positive >1%  All controls stained appropriately.          Addendum #1 performed by Enid Cutter, MD. Electronically signed 05/06/2020 Technical component performed at [facility], [place] [address], [place],  [address]. Professional component performed at [employer]. [facility], [place] [address] [place],  [address]. Immunohistochemistry Technical component (if applicable) was performed at [facility]. [address], [place], [place],  [address]. IMMUNOHISTOCHEMISTRY DISCLAIMER (if applicable): Some of these immunohistochemical stains may have been developed and the performance characteristics determine by [facility]. Some may not have been cleared or  approved by the U.S. Food and Drug Administration. The FDA has determined that such clearance or approval is not necessary. This test is used for clinical purposes. It  should not be regarded as investigational or for research. This laboratory is certified under the [place] (CLIA-88) as qualified to perform high complexity clinical laboratory testing. The controls stained appropriately.          69 year old female presenting as a recall from screening for possible right breast distortion. No history of breast surgery. EXAM: DIGITAL DIAGNOSTIC RIGHT MAMMOGRAM WITH TOMO ULTRASOUND RIGHT BREAST COMPARISON: Previous exam(s). ACR Breast Density Category b: There are scattered areas of fibroglandular density. FINDINGS: Mammogram: Spot compression tomosynthesis views of the right breast were performed. There is persistence of architectural distortion in the lower central to slightly outer right breast. The area of distortion spans approximately 0.9 cm. Ultrasound: Targeted ultrasound is performed in the right breast at 6 o'clock 1 cm from the nipple demonstrating an irregular hypoechoic mass measuring 0.8 x 0.6 x 0.6 cm. The surrounding tissues are distorted. No internal blood flow identified. Targeted ultrasound of the right axilla demonstrates normal-appearing lymph nodes. IMPRESSION: Right breast mass at 6 o'clock measuring 0.8 cm is suspicious and likely corresponds to the distortion seen mammographically. RECOMMENDATION: Ultrasound-guided core needle biopsy of the right breast mass at 6 o'clock. I have discussed the findings and recommendations with the patient. If applicable, a reminder letter will be sent to the patient regarding the next appointment. BI-RADS CATEGORY 4: Suspicious. Electronically Signed By: Audie Pinto M.D. On: 04/27/2020 10:02  Result History  MM DIAG BREAST TOMO UNI RIGHT (Order #[id number]) on  04/27/2020 - Order Result History Report <[email or web address]>  US BREAST LTD UNI RIGHT INC AXILLA (Order [id number]) - Reflex for Order [id number] Study Result  CLINICAL DATA: 69 year old female presenting as a recall from screening for possible right breast distortion. No history of breast surgery. EXAM: DIGITAL DIAGNOSTIC RIGHT MAMMOGRAM WITH TOMO ULTRASOUND RIGHT BREAST COMPARISON: Previous exam(s). ACR Breast Density Category b: There are scattered areas of fibroglandular density. FINDINGS: Mammogram: Spot compression tomosynthesis views of the right breast were performed. There is persistence of architectural distortion in the lower central to slightly outer right breast. The area of distortion spans approximately 0.9 cm. Ultrasound: Targeted ultrasound is performed in the right breast at 6 o'clock 1 cm from the nipple demonstrating an irregular hypoechoic mass measuring 0.8 x 0.6 x 0.6 cm. The surrounding tissues are distorted. No internal blood flow identified. Targeted ultrasound of the right axilla demonstrates normal-appearing lymph nodes. IMPRESSION: Right breast mass at 6 o'clock measuring 0.8 cm is suspicious and likely corresponds to the distortion seen mammographically. RECOMMENDATION: Ultrasound-guided core needle biopsy of the right breast mass at 6 o'clock. I have discussed the findings and recommendations with the patient. If applicable, a reminder letter will be sent to the patient regarding the next appointment. BI-RADS CATEGORY 4: Suspicious. Electronically Signed By: Audie Pinto M.D. On: 04/27/2020 10:02  Result History  US BREAST LTD UNI RIGHT INC AXILLA (Order #[id number]) on 04/27/2020 - Order Result History Report <[email or web address]>  MM Digital Screening (Order [id number]) Study Result  CLINICAL DATA: Screening. EXAM: DIGITAL SCREENING BILATERAL MAMMOGRAM WITH CAD COMPARISON: Previous exam(s). ACR Breast Density  Category b: There are scattered areas of fibroglandular density. FINDINGS: In the right breast, possible distortion warrants further evaluation. In the left breast, no findings suspicious for malignancy. Images were processed with CAD. IMPRESSION: Further evaluation is suggested for possible distortion in the right breast. RECOMMENDATION: Diagnostic mammogram and possibly ultrasound of the right breast. (Code:[id number]) The patient will be contacted regarding the findings, and additional imaging will be scheduled. BI-RADS CATEGORY  0: Incomplete. Need additional imaging evaluation and/or prior mammograms for comparison. Electronically Signed By: Lovey Newcomer M.D. On: 04/08/2020 09:05 .  The patient is a 69 year old female.   Past Surgical History (Chanel Teressa Senter, [place]; 05/14/2020 9:55 AM) Breast Biopsy Bilateral. Gallbladder Surgery - Laparoscopic Knee Surgery Left.  Diagnostic Studies History (Chanel Teressa Senter, CMA; 05/14/2020 9:55 AM) Colonoscopy 5-10 years ago Mammogram within last year Pap Smear 1-5 years ago  Allergies ([place], CMA; 05/14/2020 9:56 AM) No Known Drug Allergies [05/14/2020]: Allergies Reconciled  Medication History (Chanel Teressa Senter, CMA; 05/14/2020 9:56 AM) Simvastatin (20MG Tablet, Oral) Active. Triamterene-HCTZ (37.5-25MG Tablet, Oral) Active. Vitamin D (Oral) Specific strength unknown - Active. Aspirin (81MG Tablet, Oral) Active. Medications Reconciled  Social History (Chanel Teressa Senter, CMA; 05/14/2020 9:55 AM) Caffeine use Carbonated beverages, Coffee. No alcohol use No drug use Tobacco use Never smoker.  Family History Antonietta Jewel, [place]; 05/14/2020 9:55 AM) Arthritis Mother. Colon Cancer Mother. Colon Polyps Mother. Heart Disease Father. Heart disease in female family member before age 47 Hypertension Father, Mother. Melanoma Father. Migraine Headache Daughter. Ovarian Cancer Daughter. Respiratory Condition Mother. Thyroid  problems Daughter.  Pregnancy / Birth History Antonietta Jewel, [place]; 05/14/2020 9:55 AM) Age of menopause 33-60 Gravida 2 Maternal age 32-20 Para 2  Other Problems (Chanel Teressa Senter, CMA; 05/14/2020 9:55 AM) Arthritis Cholelithiasis High blood pressure Kidney Stone     Review of Systems (Chanel Nolan CMA; 05/14/2020 9:55 AM) General Not Present- Appetite Loss, Chills, Fatigue, Fever, Night Sweats, Weight Gain and Weight Loss. Skin Not Present- Change in Wart/Mole, Dryness, Hives, Jaundice, New Lesions, Non-Healing Wounds, Rash and Ulcer. HEENT Not Present- Earache, Hearing Loss, Hoarseness, Nose Bleed, Oral Ulcers, Ringing in the Ears, Seasonal Allergies, Sinus Pain, Sore Throat, Visual Disturbances, Wears glasses/contact lenses and Yellow Eyes. Respiratory Present- Snoring. Not Present- Bloody sputum, Chronic Cough, Difficulty Breathing and Wheezing. Breast Present- Breast Mass. Not Present- Breast Pain, Nipple Discharge and Skin Changes. Cardiovascular Present- Swelling of Extremities. Not Present- Chest Pain, Difficulty Breathing Lying Down, Leg Cramps, Palpitations, Rapid Heart Rate and Shortness of Breath. Gastrointestinal Present- Indigestion. Not Present- Abdominal Pain, Bloating, Bloody Stool, Change in Bowel Habits, Chronic diarrhea, Constipation, Difficulty Swallowing, Excessive gas, Gets full quickly at meals, Hemorrhoids, Nausea, Rectal Pain and Vomiting. Female Genitourinary Not Present- Frequency, Nocturia, Painful Urination, Pelvic Pain and Urgency. Musculoskeletal Present- Joint Pain, Joint Stiffness and Swelling of Extremities. Not Present- Back Pain, Muscle Pain and Muscle Weakness. Neurological Not Present- Decreased Memory, Fainting, Headaches, Numbness, Seizures, Tingling, Tremor, Trouble walking and Weakness. Psychiatric Not Present- Anxiety, Bipolar, Change in Sleep Pattern, Depression, Fearful and Frequent crying. Endocrine Not Present- Cold Intolerance, Excessive  Hunger, Hair Changes, Heat Intolerance, Hot flashes and New Diabetes. Hematology Not Present- Blood Thinners, Easy Bruising, Excessive bleeding, Gland problems, HIV and Persistent Infections.  Vitals (Chanel Nolan CMA; 05/14/2020 9:57 AM) 05/14/2020 9:56 AM Weight: 191.5 lb Height: 64in Body Surface Area: 1.92 m Body Mass Index: 32.87 kg/m  Temp.: 97.91F  Pulse: 96 (Regular)  BP: 126/82(Sitting, Left Arm, Standard)        Physical Exam (Eldridge Marcott A. Tamy Accardo MD; 05/14/2020 1:41 PM)  General Mental Status-Alert. General Appearance-Consistent with stated age. Hydration-Well hydrated. Voice-Normal.  Head and Neck Head-normocephalic, atraumatic with no lesions or palpable masses. Trachea-midline. Thyroid Gland Characteristics - normal size and consistency.  Eye Eyeball - Bilateral-Extraocular movements intact. Sclera/Conjunctiva - Bilateral-No scleral icterus.  Chest and Lung Exam Chest and lung exam reveals -quiet, even and easy respiratory effort with no use of accessory muscles  and on auscultation, normal breath sounds, no adventitious sounds and normal vocal resonance. Inspection Chest Wall - Normal. Back - normal.  Breast Breast - Left-Symmetric, Non Tender, No Biopsy scars, no Dimpling - Left, No Inflammation, No Lumpectomy scars, No Mastectomy scars, No Peau d' Orange. Breast - Right-Symmetric, Non Tender, No Biopsy scars, no Dimpling - Right, No Inflammation, No Lumpectomy scars, No Mastectomy scars, No Peau d' Orange. Breast Lump-No Palpable Breast Mass.  Cardiovascular Cardiovascular examination reveals -normal heart sounds, regular rate and rhythm with no murmurs and normal pedal pulses bilaterally.  Abdomen Inspection Inspection of the abdomen reveals - No Hernias. Skin - Scar - no surgical scars. Palpation/Percussion Palpation and Percussion of the abdomen reveal - Soft, Non Tender, No Rebound tenderness, No Rigidity (guarding)  and No hepatosplenomegaly. Auscultation Auscultation of the abdomen reveals - Bowel sounds normal.  Neurologic Neurologic evaluation reveals -alert and oriented x 3 with no impairment of recent or remote memory. Mental Status-Normal.  Musculoskeletal Normal Exam - Left-Upper Extremity Strength Normal and Lower Extremity Strength Normal. Normal Exam - Right-Upper Extremity Strength Normal and Lower Extremity Strength Normal.  Lymphatic Head & Neck  General Head & Neck Lymphatics: Bilateral - Description - Normal. Axillary  General Axillary Region: Bilateral - Description - Normal. Tenderness - Non Tender. Femoral & Inguinal  Generalized Femoral & Inguinal Lymphatics: Bilateral - Description - Normal. Tenderness - Non Tender.    Assessment & Plan (Merrisa Skorupski A. Carliss Porcaro MD; 05/14/2020 1:42 PM)  BREAST CANCER, RIGHT (C50.911) Impression: Patient has opted for right breast seed lumpectomy and right axillary sentinel lymph node mapping. Referral to medical oncology, radiation oncology Risk of lumpectomy include bleeding, infection, seroma, more surgery, use of seed/wire, wound care, cosmetic deformity and the need for other treatments, death , blood clots, death. Pt agrees to proceed. Risk of sentinel lymph node mapping include bleeding, infection, lymphedema, shoulder pain. stiffness, dye allergy. cosmetic deformity , blood clots, death, need for more surgery. Pt agrees to proceed.   Total time 45 minutes  Current Plans You are being scheduled for surgery- Our schedulers will call you.  You should hear from our office's scheduling department within 5 working days about the location, date, and time of surgery. We try to make accommodations for patient's preferences in scheduling surgery, but sometimes the OR schedule or the surgeon's schedule prevents [place] from making those accommodations.  If you have not heard from our office [phone]) in 5 working days, call the office and  ask for your surgeon's nurse.  If you have other questions about your diagnosis, plan, or surgery, call the office and ask for your surgeon's nurse.  We discussed the staging and pathophysiology of breast cancer. We discussed all of the different options for treatment for breast cancer including surgery, chemotherapy, radiation therapy, Herceptin, and antiestrogen therapy. We discussed a sentinel lymph node biopsy as she does not appear to having lymph node involvement right now. We discussed the performance of that with injection of radioactive tracer and blue dye. We discussed that she would have an incision underneath her axillary hairline. We discussed that there is a bout a 10-20% chance of having a positive node with a sentinel lymph node biopsy and we will await the permanent pathology to make any other first further decisions in terms of her treatment. One of these options might be to return to the operating room to perform an axillary lymph node dissection. We discussed about a 1-2% risk lifetime of chronic shoulder pain as well  as lymphedema associated with a sentinel lymph node biopsy. We discussed the options for treatment of the breast cancer which included lumpectomy versus a mastectomy. We discussed the performance of the lumpectomy with a wire placement. We discussed a 10-20% chance of a positive margin requiring reexcision in the operating room. We also discussed that she may need radiation therapy or antiestrogen therapy or both if she undergoes lumpectomy. We discussed the mastectomy and the postoperative care for that as well. We discussed that there is no difference in her survival whether she undergoes lumpectomy with radiation therapy or antiestrogen therapy versus a mastectomy. There is a slight difference in the local recurrence rate being 3-5% with lumpectomy and about 1% with a mastectomy. We discussed the risks of operation including bleeding, infection, possible reoperation. She  understands her further therapy will be based on what her stages at the time of her operation.  Pt Education - [facility] breast cancer surgery: discussed with patient and provided information. Pt Education - [facility] Breast Biopsy HCI: discussed with patient and provided information. Pt Education - ABC (After Breast Cancer) Class Info: discussed with patient and provided information.

## 2020-05-17 ENCOUNTER — Other Ambulatory Visit: Payer: Self-pay | Admitting: Surgery

## 2020-05-17 DIAGNOSIS — Z170 Estrogen receptor positive status [ER+]: Secondary | ICD-10-CM

## 2020-05-17 DIAGNOSIS — C50911 Malignant neoplasm of unspecified site of right female breast: Secondary | ICD-10-CM

## 2020-05-18 ENCOUNTER — Telehealth: Payer: Self-pay | Admitting: Nurse Practitioner

## 2020-05-18 NOTE — Telephone Encounter (Signed)
Received a new patient referral from Dr. Brantley Stage at [facility] for breast cancer. [name] has been cld and scheduled to see Lacie on 6/14 at 1:45pm. Pt aware to arrive 15 minutes early.

## 2020-05-20 NOTE — Progress Notes (Addendum)
[place]  [phone] Fax:([phone]) [place] Note   Patient Care Team: [name], MD as PCP - General (Family Medicine) [name], RN as Oncology Nurse Navigator [name], RN as Oncology Nurse Navigator Date of Service: 05/24/2020   CHIEF COMPLAINTS/PURPOSE OF CONSULTATION:  Right breast cancer, referred by Dr. Brantley Stage of general surgery    Oncology History  malignant neoplasm right central breast  04/27/2020 Breast US   FINDINGS: Mammogram: Spot compression tomosynthesis views of the right breast were performed. There is persistence of architectural distortion in the lower central to slightly outer right breast. The area of distortion spans approximately 0.9 cm. Ultrasound: Targeted ultrasound is performed in the right breast at 6 o'clock 1 cm from the nipple demonstrating an irregular hypoechoic mass measuring 0.8 x 0.6 x 0.6 cm. The surrounding tissues are distorted. No internal blood flow identified. Targeted ultrasound of the right axilla demonstrates normal-appearing lymph nodes.   IMPRESSION: Right breast mass at 6 o'clock measuring 0.8 cm is suspicious and likely corresponds to the distortion seen mammographically.   05/04/2020 Cancer Staging   Staging form: Breast, AJCC 8th Edition - Clinical stage from 05/04/2020: Stage IA (cT1, cN0, cM0, G2, ER+, PR+, HER2-) - Signed by [name], NP on 05/24/2020   05/04/2020 Initial Biopsy   FINAL MICROSCOPIC DIAGNOSIS:  A. BREAST, RIGHT/6:00, BIOPSY:  - Invasive ductal carcinoma.  - Ductal carcinoma in situ.  - Complex sclerosing lesion. COMMENT:  The carcinoma appears grade 2.  The greatest linear extent of tumor in any one core is 7 mm.   Immunohistochemical and morphometric analysis performed manually  The tumor cells are NEGATIVE for Her2 (1+).  Estrogen Receptor:       POSITIVE, 90% STRONG STAINING  Progesterone Receptor:   POSITIVE, 90%  STRONG STAINING  Proliferation Marker Ki-67:   10%    05/06/2020 Initial Diagnosis   malignant neoplasm right central breast    HISTORY OF PRESENTING ILLNESS:  [name] 69 y.o. female with HTN, HL, and arthritis is here because of newly diagnosed breast cancer found on screening mammogram.  Her last screening mammogram was negative on 07/10/2017. On 04/09/2011 she underwent screening mammogram showing breast density category b and possible distortion in the right breast. There were no suspicious findings in the left breast. Diagnostic right mammogram on 04/27/20 showed architectural distortion in the lower central to slightly outer right breast spanning 0.9 cm. Targeted US showed in the right breast at 6 o'clock 1 cmfn an irregular hypoechoic mass measuring 0.8 x0.6 x0.6 cm. The right axilla demonstrate normal appearing LNs. She underwent biopsy and clip placement on 05/04/20. Pathology showed invasive ductal carcinoma and DCIS in a complex sclerosing lesion, grade II. The tumor cells are strongly ER+ 90%, strongly PR + 90% and negative for HER2 (1+). The proliferation marker Ki-67 is 10%. She is scheduled to undergo right breast lumpectomy with axillary sentinel lymph node mapping on 06/08/20 by Dr. Brantley Stage.   GYN HISTORY  Menarchal: 27 LMP: age [age] HRT: None G2P2  Socially, she is married, lives with spouse. She has 2 adult children, a son and a daughter. She is retired Consulting civil engineer. She is independent with ADLs. Denies alcohol, tobacco, or drug use. She is up to date on her age-appropriate cancer screenings. Has strong family history of cancer including mother with colon cancer, father with melanoma, daughter who had ovarian at age 11 s/p surgery and chemo, MGM with stomach  cancer, as well as lung cancer in her family.   Today, She presents with her daughter. She feels well. Did not feel the breast mass herself. Denies fatigue, weight loss, palpable breast mass, nipple discharge or  inversion, change in bowel habits, GI/Gyn bleeding, recent fever, chills, cough, chest pain, dyspnea. She has significant joint pain in her knees, shoulder, hands, and feet. She remains active and functional. She takes advil PRN.   MEDICAL HISTORY:  Past Medical History:  Diagnosis Date  . Hyperlipemia   . Hypertension   . Invasive ductal carcinoma of breast ([place])   . Kidney stones before 2011-12-17   states she has passed stones 4 times/ tsf  . Lower extremity edema   . Obesity     SURGICAL HISTORY: Past Surgical History:  Procedure Laterality Date  . BREAST BIOPSY Left   . CHOLECYSTECTOMY    . COLONOSCOPY  01/01/2012   Procedure: COLONOSCOPY;  Surgeon: Daneil Dolin, MD;  Location: [facility];  Service: Endoscopy;  Laterality: N/A;  10:00 AM  . JOINT REPLACEMENT  2006   left knee  . US ECHOCARDIOGRAPHY  08/21/2011   LA mild to mod. dilated,mild MR,TR    SOCIAL HISTORY: Social History   Socioeconomic History  . Marital status: Married    Spouse name: Not on file  . Number of children: 2  . Years of education: Not on file  . Highest education level: Not on file  Occupational History  . Not on file  Tobacco Use  . Smoking status: Never Smoker  . Smokeless tobacco: Never Used  Substance and Sexual Activity  . Alcohol use: No  . Drug use: No  . Sexual activity: Not on file  Other Topics Concern  . Not on file  Social History Narrative  . Not on file   Social Determinants of Health   Financial Resource Strain:   . Difficulty of Paying Living Expenses:   Food Insecurity:   . Worried About Charity fundraiser in the Last Year:   . Arboriculturist in the Last Year:   Transportation Needs:   . Film/video editor (Medical):   Marland Kitchen Lack of Transportation (Non-Medical):   Physical Activity:   . Days of Exercise per Week:   . Minutes of Exercise per Session:   Stress:   . Feeling of Stress :   Social Connections:   . Frequency of Communication with Friends and  Family:   . Frequency of Social Gatherings with Friends and Family:   . Attends Religious Services:   . Active Member of Clubs or Organizations:   . Attends Archivist Meetings:   Marland Kitchen Marital Status:   Intimate Partner Violence:   . Fear of Current or Ex-Partner:   . Emotionally Abused:   Marland Kitchen Physically Abused:   . Sexually Abused:     FAMILY HISTORY: Family History  Problem Relation Age of Onset  . Hypertension Mother   . Cancer Mother 75       colon   . Hypertension Father   . Heart attack Father   . Cancer Father        melanoma  . Cancer Maternal Aunt        uterine  . Cancer Daughter 52       ovarian   . Anesthesia problems Neg Hx     ALLERGIES:  has No Known Allergies.  MEDICATIONS:  Current Outpatient Medications  Medication Sig Dispense Refill  . ibuprofen (  ADVIL,MOTRIN) 200 MG tablet Take 200 mg by mouth every 6 (six) hours as needed. pain    . simvastatin (ZOCOR) 20 MG tablet Take 1 tablet (20 mg total) by mouth daily at 6 PM. Please schedule annual appt with Dr. Sallyanne Kuster for refills. [phone]. 2nd attempt. 90 tablet 3  . triamterene-hydrochlorothiazide (MAXZIDE-25) 37.5-25 MG tablet Take 1 tablet by mouth daily. 90 tablet 3  . aspirin 81 MG tablet Take 81 mg by mouth daily.      . Cholecalciferol (VITAMIN D) 2000 units tablet Take 2 tablets (4,000 Units total) by mouth daily.     No current facility-administered medications for this visit.    REVIEW OF SYSTEMS:   Constitutional: Denies fevers, chills or abnormal night sweats Eyes: Denies blurriness of vision, double vision or watery eyes Ears, nose, mouth, throat, and face: Denies mucositis or sore throat Respiratory: Denies cough, dyspnea or wheezes Cardiovascular: Denies palpitation, chest discomfort or lower extremity swelling Gastrointestinal:  Denies nausea, heartburn or change in bowel habits Skin: Denies abnormal skin rashes Lymphatics: Denies new lymphadenopathy or easy  bruising Neurological:Denies numbness, tingling or new weaknesses Behavioral/Psych: Mood is stable, no new changes  MSK: (+) arthritis - knees, shoulder, hands, feet  All other systems were reviewed with the patient and are negative.  PHYSICAL EXAMINATION: ECOG PERFORMANCE STATUS: 0 - Asymptomatic  Vitals:   05/24/20 1338  BP: 138/81  Pulse: 85  Resp: 18  Temp: 97.8 F (36.6 C)  SpO2: 97%   Filed Weights   05/24/20 1338  Weight: 191 lb 6.4 oz (86.8 kg)    GENERAL:alert, no distress and comfortable SKIN: no rash to exposed skin  EYES: sclera clear LYMPH:  no palpable cervical, supraclavicular, or infraclavicular lymphadenopathy  LUNGS: clear with normal breathing effort HEART: regular rate & rhythm, no lower extremity edema ABDOMEN: abdomen soft, non-tender and normal bowel sounds Musculoskeletal:no cyanosis of digits and no clubbing  PSYCH: alert & oriented x 3 with fluent speech NEURO: no focal motor/sensory deficits BREAST: symmetrical without nipple discharge or inversion. Small ecchymosis in lower right breast 6 o'clock position. No palpable mass in either breast or axilla that I could appreciate.   LABORATORY DATA:  I have reviewed the data as listed CBC Latest Ref Rng & Units 04/05/2020 06/11/2017  WBC 3.8 - 10.8 Thousand/uL 9.2 9.7  Hemoglobin 11.7 - 15.5 g/dL 13.5 13.0  Hematocrit 35 - 45 % 40.8 39.5  Platelets 140 - 400 Thousand/uL 253 225   CMP Latest Ref Rng & Units 04/05/2020 02/17/2019 12/20/2017  Glucose 65 - 99 mg/dL 94 101(H) 100(H)  BUN 7 - 25 mg/dL '18 20 23  ' Creatinine 0.50 - 0.99 mg/dL 0.86 0.91 0.75  Sodium 135 - 146 mmol/L 140 139 140  Potassium 3.5 - 5.3 mmol/L 4.5 4.5 3.8  Chloride 98 - 110 mmol/L 102 103 102  CO2 20 - 32 mmol/L '28 27 29  ' Calcium 8.6 - 10.4 mg/dL 10.6(H) 10.0 9.5  Total Protein 6.1 - 8.1 g/dL 7.3 7.0 6.9  Total Bilirubin 0.2 - 1.2 mg/dL 1.5(H) 1.3(H) 0.9  Alkaline Phos 33 - 130 U/L - - -  AST 10 - 35 U/L '19 16 15  ' ALT 6 - 29 U/L  '16 15 13    ' RADIOGRAPHIC STUDIES: I have personally reviewed the radiological images as listed and agreed with the findings in the report. US BREAST LTD UNI RIGHT INC AXILLA  Result Date: 04/27/2020 CLINICAL DATA:  69 year old female presenting as a recall from screening for  possible right breast distortion. No history of breast surgery. EXAM: DIGITAL DIAGNOSTIC RIGHT MAMMOGRAM WITH TOMO ULTRASOUND RIGHT BREAST COMPARISON:  Previous exam(s). ACR Breast Density Category b: There are scattered areas of fibroglandular density. FINDINGS: Mammogram: Spot compression tomosynthesis views of the right breast were performed. There is persistence of architectural distortion in the lower central to slightly outer right breast. The area of distortion spans approximately 0.9 cm. Ultrasound: Targeted ultrasound is performed in the right breast at 6 o'clock 1 cm from the nipple demonstrating an irregular hypoechoic mass measuring 0.8 x 0.6 x 0.6 cm. The surrounding tissues are distorted. No internal blood flow identified. Targeted ultrasound of the right axilla demonstrates normal-appearing lymph nodes. IMPRESSION: Right breast mass at 6 o'clock measuring 0.8 cm is suspicious and likely corresponds to the distortion seen mammographically. RECOMMENDATION: Ultrasound-guided core needle biopsy of the right breast mass at 6 o'clock. I have discussed the findings and recommendations with the patient. If applicable, a reminder letter will be sent to the patient regarding the next appointment. BI-RADS CATEGORY  4: Suspicious. Electronically Signed   By: Audie Pinto M.D.   On: 04/27/2020 10:02   MM DIAG BREAST TOMO UNI RIGHT  Result Date: 04/27/2020 CLINICAL DATA:  69 year old female presenting as a recall from screening for possible right breast distortion. No history of breast surgery. EXAM: DIGITAL DIAGNOSTIC RIGHT MAMMOGRAM WITH TOMO ULTRASOUND RIGHT BREAST COMPARISON:  Previous exam(s). ACR Breast Density Category b:  There are scattered areas of fibroglandular density. FINDINGS: Mammogram: Spot compression tomosynthesis views of the right breast were performed. There is persistence of architectural distortion in the lower central to slightly outer right breast. The area of distortion spans approximately 0.9 cm. Ultrasound: Targeted ultrasound is performed in the right breast at 6 o'clock 1 cm from the nipple demonstrating an irregular hypoechoic mass measuring 0.8 x 0.6 x 0.6 cm. The surrounding tissues are distorted. No internal blood flow identified. Targeted ultrasound of the right axilla demonstrates normal-appearing lymph nodes. IMPRESSION: Right breast mass at 6 o'clock measuring 0.8 cm is suspicious and likely corresponds to the distortion seen mammographically. RECOMMENDATION: Ultrasound-guided core needle biopsy of the right breast mass at 6 o'clock. I have discussed the findings and recommendations with the patient. If applicable, a reminder letter will be sent to the patient regarding the next appointment. BI-RADS CATEGORY  4: Suspicious. Electronically Signed   By: Audie Pinto M.D.   On: 04/27/2020 10:02   MM CLIP PLACEMENT RIGHT  Result Date: 05/04/2020 CLINICAL DATA:  Post ultrasound-guided biopsy of a mass in the right breast at the 6 o'clock position. EXAM: DIAGNOSTIC RIGHT MAMMOGRAM POST ULTRASOUND BIOPSY COMPARISON:  Previous exams. FINDINGS: Mammographic images were obtained following ultrasound guided biopsy of a mass in the right breast at the 6 o'clock position. The biopsy marking clip is appropriately positioned at the site of the biopsied mass in the right breast at the 6 o'clock position. IMPRESSION: Biopsy marking clip appropriately positioned at site of biopsied mass in the right breast at the 6 o'clock position. Final Assessment: Post Procedure Mammograms for Marker Placement Electronically Signed   By: Everlean Alstrom M.D.   On: 05/04/2020 08:36   [place] RT BREAST BX W LOC DEV 1ST LESION IMG  BX SPEC US GUIDE  Addendum Date: 05/05/2020   ADDENDUM REPORT: 05/05/2020 13:08 ADDENDUM: PATHOLOGY revealed: A. BREAST, RIGHT/6:00, BIOPSY: - Invasive ductal carcinoma. - Ductal carcinoma in situ. - Complex sclerosing lesion. COMMENT: The carcinoma appears grade 2. The greatest linear extent of  tumor in any one core is 7 mm. Pathology results are CONCORDANT with imaging findings, per Dr. Everlean Alstrom. Pathology results and recommendations below were discussed with patient by telephone on 05/05/2020. Patient reported biopsy site doing well with slight tenderness at the site. Post biopsy care instructions were reviewed and questions were answered. Patient was instructed to call [facility] Mammography Department if any concerns or questions arise related to the biopsy. Recommendation: Surgical referral. Request for surgical referral was relayed to Kathi Der RT at [facility] Mammography Department by Electa Sniff RN on 5/26 2021. Addendum by Electa Sniff RN on 05/05/2020. Electronically Signed   By: Everlean Alstrom M.D.   On: 05/05/2020 13:08   Result Date: 05/05/2020 CLINICAL DATA:  69 year old female with a suspicious mass in the right breast at the 6 o'clock position. EXAM: ULTRASOUND GUIDED RIGHT BREAST CORE NEEDLE BIOPSY COMPARISON:  Previous exam(s). PROCEDURE: I met with the patient and we discussed the procedure of ultrasound-guided biopsy, including benefits and alternatives. We discussed the high likelihood of a successful procedure. We discussed the risks of the procedure, including infection, bleeding, tissue injury, clip migration, and inadequate sampling. Informed written consent was given. The usual time-out protocol was performed immediately prior to the procedure. Lesion quadrant: Lower inner Using sterile technique and 1% Lidocaine as local anesthetic, under direct ultrasound visualization, a 14 gauge spring-loaded device was used to perform biopsy of the mass  in the right breast at the 6 o'clock position using a lateral to medial approach. At the conclusion of the procedure a ribbon shaped tissue marker clip was deployed into the biopsy cavity. Follow up 2 view mammogram was performed and dictated separately. IMPRESSION: Ultrasound guided biopsy of the mass in the right breast at the 6 o'clock position. No apparent complications. Electronically Signed: By: Everlean Alstrom M.D. On: 05/04/2020 08:30    ASSESSMENT & PLAN: 69 yo female with   1. Invasive ductal carcinoma and DCIS of the central right breast, ER 90% + strong, PR 90% + strong, HER2 (1+) negative, Ki67 10%, grade 2, [id number] stage IA -we reviewed her medical record in detail including mammogram, [place], and breast biopsy in detail. She presented with a right breast mass on screening mammogram, measures 0.9 cm, with negative LNs. Breast biopsy showed grade II invasive ductal carcinoma and DCIS, ER/PR +, Her2 negative.  -She is a candidate for lumpectomy and sentinel lymph node mapping which is scheduled with Dr. Brantley Stage on 06/08/20.  -Given her grade 2 tumor >0.5 cm, we are recommending oncotype testing to determine her recurrence risk and whether she would benefit from adjuvant chemo. She is 65 but otherwise healthy and active, would be a good candidate for chemotherapy if her Oncotype recurrence score is high. -If her surgical sentinel lymph node positive, we recommend mammaprint for further risk stratification to guide decision about adjuvant chemotherapy. She is agreeable with oncotype/mammprint. -Her strong family history of cancer, including her daughter who had ovarian cancer, is somewhat suggestive of inheritable cancer syndrome or mutation such as BRCA1/BRCA2. Her daughter never had genetic testing. She is agreeable. If she is found to have BRCA mutation, she would consider bilateral prophylactic mastectomy to prevent future breast cancer. If negative, she will proceed with lumpectomy. Lissa Merlin the  strong ER and PR expression in her postmenopausal status, we recommend adjuvant endocrine therapy with aromatase inhibitor for a total of 5-10 years to reduce the risk of cancer recurrence. Potential benefits and side effects were  discussed with patient and she is interested. The potential benefit and side effects, which includes but not limited to hot flash, skin and vaginal dryness, metabolic changes ( increased blood glucose, cholesterol, weight, etc.), slightly in increased risk of cardiovascular disease, cataracts, muscular and joint discomfort, osteopenia and osteoporosis, etc, were discussed with her in great details. Due to her existing arthritis and joint pain, tamoxifen may be a better option.  She is interested, and we'll start after she completes radiation. -Normal DEXA in 2018, will repeat before starting anti-estrogen therapy  -She will see Dr. [name] on 06/24/20 after surgery to discuss adjuvant radiation. We reviewed the goal is to reduce risk of local recurrence. She and Dr. [name] will make a final decision after surgery -We also discussed breast cancer surveillance after her surgery. She will continue annual screening mammogram, self exam, and a routine office visit with lab and exam with [place]. -I encouraged her to have healthy diet and exercise regularly.  -we will f/u with her about genetic test result. If she does not need chemo, we will see her near the end of radiation.   2. Family history -Patient has strong family history of cancer including a daughter who had ovarian cancer at age 41 and did not receive genetic testing. Family history also includes melanoma, colon, breast, and stomach cancers  -She qualifies for testing and is interested. Will request urgent evaluation specifically to r/o BRCA mutation. If positive the patient may consider prophylactic bilateral mastectomy.  -I have referred her  PLAN: -Mammogram/US and biopsy reviewed -Urgent referral to genetics -Definitive  surgery per Dr. Brantley Stage on 06/08/20  -Oncotype testing on surgical sample  -Consult with Dr. [name] on 06/24/20  -F/u after RT unless oncotype shows high risk disease  Orders Placed This Encounter  Procedures  . Ambulatory referral to Genetics    Referral Priority:   Urgent    Referral Type:   Consultation    Referral Reason:   Specialty Services Required    Number of Visits Requested:   1    All questions were answered. The patient knows to call the clinic with any problems, questions or concerns.     [name], NP 05/25/2020   Addendum  I have seen the patient, examined her. I agree with the assessment and and plan and have edited the notes.   Ms Hebert is a 69 yo female with screening discovered right breast cancer, invasive ductal carcinoma with component of DCIS.  Tumor is about 0.9 cm, grade 2, ER/PR strongly positive, HER-2 negative.  I discussed that this is stage Ia disease, likely low risk for recurrence after surgical resection.  She is scheduled for lumpectomy and sentinel lymph node biopsy by Dr. Brantley Stage on June 08, 2020.  I recommend Oncotype for risk stratification, if tumor more than 0.5 cm and G2 or G3, or 1cm or above if it's Grade 1 on surgical path.  We discussed the role of adjuvant radiation to reduce her risk of local recurrence, and antiestrogen therapy to reduce her risk of recurrence.  Patient appears to be interested in adjuvant therapy.  She is scheduled to see radiation oncology after surgery.  I will see her back after radiation, or sooner if Oncotype shows high risk disease. All questions were answered.  I will breast cancer navigator will contact her to coordinate her care.   [name]  05/24/2020

## 2020-05-24 ENCOUNTER — Encounter: Payer: Self-pay | Admitting: Nurse Practitioner

## 2020-05-24 ENCOUNTER — Other Ambulatory Visit: Payer: Self-pay

## 2020-05-24 ENCOUNTER — Telehealth: Payer: Self-pay | Admitting: Nurse Practitioner

## 2020-05-24 ENCOUNTER — Inpatient Hospital Stay: Payer: Medicare PPO | Attending: Nurse Practitioner | Admitting: Nurse Practitioner

## 2020-05-24 VITALS — BP 138/81 | HR 85 | Temp 97.8°F | Resp 18 | Ht 63.0 in | Wt 191.4 lb

## 2020-05-24 DIAGNOSIS — C50111 Malignant neoplasm of central portion of right female breast: Secondary | ICD-10-CM | POA: Diagnosis not present

## 2020-05-24 DIAGNOSIS — Z170 Estrogen receptor positive status [ER+]: Secondary | ICD-10-CM | POA: Diagnosis not present

## 2020-05-24 NOTE — Telephone Encounter (Signed)
Scheduled per 6/14 sch message. Printed avs for pt. No calendar needed.

## 2020-05-25 ENCOUNTER — Other Ambulatory Visit: Payer: Self-pay | Admitting: Genetic Counselor

## 2020-05-25 ENCOUNTER — Inpatient Hospital Stay: Payer: Medicare PPO

## 2020-05-25 ENCOUNTER — Encounter: Payer: Self-pay | Admitting: *Deleted

## 2020-05-25 ENCOUNTER — Inpatient Hospital Stay (HOSPITAL_BASED_OUTPATIENT_CLINIC_OR_DEPARTMENT_OTHER): Payer: Medicare PPO | Admitting: Genetic Counselor

## 2020-05-25 ENCOUNTER — Encounter: Payer: Self-pay | Admitting: Genetic Counselor

## 2020-05-25 ENCOUNTER — Telehealth: Payer: Self-pay | Admitting: *Deleted

## 2020-05-25 ENCOUNTER — Other Ambulatory Visit: Payer: Self-pay

## 2020-05-25 ENCOUNTER — Encounter: Payer: Self-pay | Admitting: Nurse Practitioner

## 2020-05-25 DIAGNOSIS — Z8041 Family history of malignant neoplasm of ovary: Secondary | ICD-10-CM | POA: Diagnosis not present

## 2020-05-25 DIAGNOSIS — Z800 Family history of malignant neoplasm of digestive organs: Secondary | ICD-10-CM

## 2020-05-25 DIAGNOSIS — C50911 Malignant neoplasm of unspecified site of right female breast: Secondary | ICD-10-CM | POA: Diagnosis not present

## 2020-05-25 DIAGNOSIS — Z808 Family history of malignant neoplasm of other organs or systems: Secondary | ICD-10-CM | POA: Diagnosis not present

## 2020-05-25 DIAGNOSIS — Z806 Family history of leukemia: Secondary | ICD-10-CM

## 2020-05-25 DIAGNOSIS — Z801 Family history of malignant neoplasm of trachea, bronchus and lung: Secondary | ICD-10-CM

## 2020-05-25 DIAGNOSIS — Z8049 Family history of malignant neoplasm of other genital organs: Secondary | ICD-10-CM | POA: Insufficient documentation

## 2020-05-25 LAB — GENETIC SCREENING ORDER

## 2020-05-25 NOTE — Progress Notes (Signed)
REFERRING PROVIDER: [name], NP [place],  [place] [id number]  PRIMARY PROVIDER:  [name], MD  PRIMARY REASON FOR VISIT:  1. Infiltrating ductal carcinoma of right breast ([name])   2. Family history of ovarian cancer   3. Family history of colon cancer   4. Family history of melanoma   5. Family history of bone cancer   6. Family history of uterine cancer   7. Family history of stomach cancer   8. Family history of leukemia   9. Family history of lung cancer       HISTORY OF PRESENT ILLNESS:   [name], a 69 y.o. female, was seen for a [facility] cancer genetics consultation at the request of [name] due to a personal and family history of cancer.  [name] presents to clinic today with her daughter, [name], to discuss the possibility of a hereditary predisposition to cancer, genetic testing, and to further clarify her future cancer risks, as well as potential cancer risks for family members.   In May 2021, at the age of 87, [name] was diagnosed with invasive ductal carcinoma and DCIS, ER+/PR+/Her2-, of the right breast. The treatment plan includes surgery (scheduled 06/08/20 with [name]), oncotype and mammaprint, adjuvant radiation therapy, and adjuvant endocrine therapy with an aromatase inhibitor.    CANCER HISTORY:  Oncology History  malignant neoplasm right central breast  04/27/2020 Breast US   FINDINGS: Mammogram: Spot compression tomosynthesis views of the right breast were performed. There is persistence of architectural distortion in the lower central to slightly outer right breast. The area of distortion spans approximately 0.9 cm. Ultrasound: Targeted ultrasound is performed in the right breast at 6 o'clock 1 cm from the nipple demonstrating an irregular hypoechoic mass measuring 0.8 x 0.6 x 0.6 cm. The surrounding tissues are distorted. No internal blood flow identified. Targeted ultrasound of the right  axilla demonstrates normal-appearing lymph nodes.   IMPRESSION: Right breast mass at 6 o'clock measuring 0.8 cm is suspicious and likely corresponds to the distortion seen mammographically.   05/04/2020 Cancer Staging   Staging form: Breast, AJCC 8th Edition - Clinical Jacobson from 05/04/2020: [name] (cT1, cN0, cM0, G2, ER+, PR+, HER2-) - Signed by Shannon Feeling, NP on 05/24/2020   05/04/2020 Initial Biopsy   FINAL MICROSCOPIC DIAGNOSIS:  A. BREAST, RIGHT/6:00, BIOPSY:  - Invasive ductal carcinoma.  - Ductal carcinoma in situ.  - Complex sclerosing lesion. COMMENT:  The carcinoma appears grade 2.  The greatest linear extent of tumor in any one core is 7 mm.   Immunohistochemical and morphometric analysis performed manually  The tumor cells are NEGATIVE for Her2 (1+).  Estrogen Receptor:       POSITIVE, 90% STRONG STAINING  Progesterone Receptor:   POSITIVE, 90% STRONG STAINING  Proliferation Marker Ki-67:   10%    05/06/2020 Initial Diagnosis   malignant neoplasm right central breast      RISK FACTORS:  Menarche was at age 43.  First live birth at age 6.  OCP use for approximately 9-15 years.  Ovaries intact: yes.  Hysterectomy: no.  Menopausal status: postmenopausal.  HRT use: 0 years. Colonoscopy: yes; 01/01/2012 - no polyps. Mammogram within the last year: yes. Any excessive radiation exposure in the past: no   Past Medical History:  Diagnosis Date  . Family history of bone cancer   . Family history of colon cancer   . Family history of leukemia   .  Family history of lung cancer   . Family history of melanoma   . Family history of ovarian cancer   . Family history of stomach cancer   . Family history of uterine cancer   . Hyperlipemia   . Hypertension   . Invasive ductal carcinoma of breast ([name])   . Kidney stones before December 23, 2011   states she has passed stones 4 times/ tsf  . Lower extremity edema   . Obesity     Past Surgical History:  Procedure  Laterality Date  . BREAST BIOPSY Left   . CHOLECYSTECTOMY    . COLONOSCOPY  01/01/2012   Procedure: COLONOSCOPY;  Surgeon: Shannon Dolin, MD;  Location: [facility];  Service: Endoscopy;  Laterality: N/A;  10:00 AM  . JOINT REPLACEMENT  2006   left knee  . US ECHOCARDIOGRAPHY  08/21/2011   LA mild to mod. dilated,mild MR,TR    Social History   Socioeconomic History  . Marital status: Married    Spouse name: Not on file  . Number of children: 2  . Years of education: Not on file  . Highest education level: Not on file  Occupational History  . Not on file  Tobacco Use  . Smoking status: Never Smoker  . Smokeless tobacco: Never Used  Substance and Sexual Activity  . Alcohol use: No  . Drug use: No  . Sexual activity: Not on file  Other Topics Concern  . Not on file  Social History Narrative  . Not on file   Social Determinants of Health   Financial Resource Strain:   . Difficulty of Paying Living Expenses:   Food Insecurity:   . Worried About Charity fundraiser in the Last Year:   . Arboriculturist in the Last Year:   Transportation Needs:   . Film/video editor (Medical):   Marland Kitchen Lack of Transportation (Non-Medical):   Physical Activity:   . Days of Exercise per Week:   . Minutes of Exercise per Session:   Stress:   . Jacobson of Stress :   Social Connections:   . Frequency of Communication with Friends and Family:   . Frequency of Social Gatherings with Friends and Family:   . Attends Religious Services:   . Active Member of Clubs or Organizations:   . Attends Archivist Meetings:   Marland Kitchen Marital Status:      FAMILY HISTORY:  We obtained a detailed, 4-generation family history.  Significant diagnoses are listed below: Family History  Problem Relation Age of Onset  . Hypertension Mother   . Colon cancer Mother 30  . Hypertension Father   . Heart attack Father   . Melanoma Father        dx. in his early 8s (x3)  . Uterine cancer Maternal Aunt         dx. >50  . Cancer Daughter 42       ovarian (dysgerminoma)  . Leukemia Paternal Uncle        dx. in his late 58s  . Stomach cancer Maternal Grandmother        dx. in her 53s  . Bone cancer Maternal Aunt        dx. in her early 76s  . Lung cancer Maternal Aunt   . Testicular cancer Cousin        dx. in his late 30s/early 68s (maternal cousin)  . Anesthesia problems Neg Hx    Ms. Casa has one daughter,  [name] (age 64), and one son (age 53). Her daughter has a history of ovarian cancer (dysgerminoma) diagnosed at the age of 34, treated with surgery and chemotherapy. Ms. Andrzejewski does not have any siblings.  Ms. Denman mother died at the age of 62 from colon cancer that had been diagnosed at the age of 67 and metastasized to her liver and lungs. Her mother had normal genetic testing, although they are not sure what type of genetic testing she had (e.g. whether she had tumor or germline testing, or which genes were analyzed). She also had a history of basal cell carcinoma on her face diagnosed in her 23s. Ms. Reith mother had six full-sisters, two maternal half-brothers, and two maternal half-sisters. One aunt had bone cancer in her early 76s and another aunt had uterine cancer diagnosed older than 62. There is also lung cancer in the family, although Ms. Raczynski is not sure who had this cancer. One maternal cousin had testicular cancer diagnosed in his late 62s or early 42s. Ms. Santucci maternal grandmother had stomach cancer diagnosed in her 37s. She does not have information about her maternal grandfather.  Ms. Rosenau father is 68 and has a history of three melanomas removed beginning in his early 33s. She had two paternal uncles and two paternal aunts. One uncle died in his 63s from leukemia, which had been diagnosed in his late 60s. There is no known cancer among paternal cousins. Her paternal grandmother died at the age of 27 and her paternal grandfather died at the age  of 36 - neither had cancer.  Ms. Gundy is aware of previous family history of genetic testing for hereditary cancer risks in her mother (normal results, record not available for review). She does not know her ancestry. There is no reported Ashkenazi Jewish ancestry. There is no known consanguinity.  GENETIC COUNSELING ASSESSMENT: Ms. Woulfe is a 69 y.o. female with a personal history of breast cancer and a family history of ovarian, colon, uterine, and stomach cancer, which is somewhat suggestive of a hereditary cancer syndrome and predisposition to cancer. We, therefore, discussed and recommended the following at today's visit.   DISCUSSION:  We discussed that approximately 5-10% of cancer is hereditary. Most cases of hereditary breast and ovarian cancer are associated with the BRCA1 and BRCA 2 genes. There are other genes that can be associated with hereditary breast cancer syndromes.  These include ATM, CHEK2, PALB2, etc. We discussed that testing is beneficial for several reasons, including knowing about other cancer risks, identifying potential screening and risk-reduction options that may be appropriate, and to understand if other family members could be at risk for cancer and allow them to undergo genetic testing.   We reviewed the characteristics, features and inheritance patterns of hereditary cancer syndromes. We also discussed genetic testing, including the appropriate family members to test, the process of testing, insurance coverage and turn-around-time for results. We discussed the implications of a negative, positive and/or variant of uncertain significant result. In order to get genetic test results in a timely manner so that Ms. Rolland can use these genetic test results for surgical decisions, we recommended Ms. Karel pursue genetic testing for the [facility] Breast Cancer STAT panel. Once complete, we recommend Ms. Mally pursue reflex genetic testing to the Common Hereditary Cancers  panel.   The Breast Cancer STAT Panel offered by [facility] includes sequencing and deletion/duplication analysis for the following 9 genes:  ATM, BRCA1, BRCA2, CDH1, CHEK2, PALB2, PTEN, STK11 and TP53. The  Common Hereditary Cancers Panel offered by [facility] includes sequencing and/or deletion duplication testing of the following 48 genes: APC, ATM, AXIN2, BARD1, BMPR1A, BRCA1, BRCA2, BRIP1, CDH1, CDK4, CDKN2A (p14ARF), CDKN2A (p16INK4a), CHEK2, CTNNA1, DICER1, EPCAM (Deletion/duplication testing only), GREM1 (promoter region deletion/duplication testing only), KIT, MEN1, MLH1, MSH2, MSH3, MSH6, MUTYH, NBN, NF1, NHTL1, PALB2, PDGFRA, PMS2, POLD1, POLE, PTEN, RAD50, RAD51C, RAD51D, RNF43, SDHB, SDHC, SDHD, SMAD4, SMARCA4. STK11, TP53, TSC1, TSC2, and VHL.  The following genes were evaluated for sequence changes only: SDHA and HOXB13 c.251G>A variant only.  Based on Ms. Ringgenberg's personal and family history of cancer, she meets medical criteria for genetic testing. Despite that she meets criteria, she may still have an out of pocket cost.  PLAN: After considering the risks, benefits, and limitations, Ms. Horkey provided informed consent to pursue genetic testing and the blood sample was [facility] to [facility] for analysis of the Breast Cancer STAT Panel + Common Hereditary Cancers Panel. Results should be available within approximately one-two weeks' time, at which point they will be disclosed by telephone to Ms. Chandley, as will any additional recommendations warranted by these results. Ms. Anderegg will receive a summary of her genetic counseling visit and a copy of her results once available. This information will also be available in Epic.   Ms. Pulley questions were answered to her satisfaction today. Our contact information was provided should additional questions or concerns arise. Thank you for the referral and allowing [place] to share in the care of your patient.   Clint Guy, [place],  [facility] Licensed, Certified Dispensing [email or web address] Phone: [phone]  The patient was seen for a total of 40 minutes in face-to-face genetic counseling.  This patient was discussed with Drs. Magrinat, Lindi Adie and/or Burr Medico who agrees with the above.    _______________________________________________________________________ For Office Staff:  Number of people involved in session: 1 Was an Intern/ student involved with case: no

## 2020-05-25 NOTE — Telephone Encounter (Signed)
Spoke with patient to follow up from new patient appointment and discuss navigation needs.  Patient denies any questions or concerns at this time.  She is aware of all her appointments. Contact information given and encouraged her to call with any concerns,questions, or needs. Patient verbalized understanding.

## 2020-06-01 DIAGNOSIS — L570 Actinic keratosis: Secondary | ICD-10-CM | POA: Diagnosis not present

## 2020-06-01 DIAGNOSIS — L820 Inflamed seborrheic keratosis: Secondary | ICD-10-CM | POA: Diagnosis not present

## 2020-06-01 DIAGNOSIS — X32XXXD Exposure to sunlight, subsequent encounter: Secondary | ICD-10-CM | POA: Diagnosis not present

## 2020-06-01 DIAGNOSIS — Z08 Encounter for follow-up examination after completed treatment for malignant neoplasm: Secondary | ICD-10-CM | POA: Diagnosis not present

## 2020-06-01 DIAGNOSIS — Z1283 Encounter for screening for malignant neoplasm of skin: Secondary | ICD-10-CM | POA: Diagnosis not present

## 2020-06-01 DIAGNOSIS — Z85828 Personal history of other malignant neoplasm of skin: Secondary | ICD-10-CM | POA: Diagnosis not present

## 2020-06-03 ENCOUNTER — Telehealth: Payer: Self-pay | Admitting: Genetic Counselor

## 2020-06-03 NOTE — Telephone Encounter (Signed)
Revealed negative genetic testing. Discussed that we do not know why she has breast cancer or why there is cancer in the family. There could be a genetic mutation in the family that [name] did not inherit. There could also be a mutation in a different gene that we are not testing, or our current technology may not be able detect certain mutations. It will therefore be important for her to stay in contact with genetics to keep up with whether additional testing may be appropriate in the future.   A variant of uncertain significance was detected in the AXIN2 gene called c.1235A>G. Her result is still considered normal at this time and should not impact her medical management.

## 2020-06-03 NOTE — Pre-Procedure Instructions (Signed)
[name]  06/03/2020      [id number] - [place], [address] AT [address] [place] [address] Phone: [phone] Fax: [phone]    Your procedure is scheduled on June 29  Report to [facility] Entrance A at 5:30 A.M.  Call this number if you have problems the morning of surgery:  [phone]   Remember:  Do not eat after midnight.  You may drink clear liquids until 4:30 A.M..  Clear liquids allowed are:                    Water, Juice (non-citric and without pulp - diabetics please choose diet or no sugar options), Carbonated beverages - (diabetics please choose diet or no sugar options), Clear Tea, Black Coffee only (no creamer, milk or cream including half and half), Plain Jell-O only (diabetics please choose diet or no sugar options), Gatorade (diabetics please choose diet or no sugar options) and Plain Popsicles only    Please complete your PRE-SURGERY ENSURE that was provided to you by 4:30 A.M.  the morning of surgery.  Please, if able, drink it in one setting. DO NOT SIP.    Take these medicines the morning of surgery with A SIP OF WATER :   None               7 days prior to surgery STOP taking any Aspirin (unless otherwise instructed by your surgeon), Aleve, Naproxen, Ibuprofen, Motrin, Advil, Goody's, BC's, all herbal medications, fish oil, and all vitamins.                   Do not wear jewelry, make-up or nail polish.  Do not wear lotions, powders, or perfumes, or deodorant.  Do not shave 48 hours prior to surgery.  Men may shave face and neck.  Do not bring valuables to the hospital.  [facility] is not responsible for any belongings or valuables.  Contacts, dentures or bridgework may not be worn into surgery.  Leave your suitcase in the car.  After surgery it may be brought to your room.  For patients admitted to the hospital, discharge time will be determined by your treatment team.  Patients discharged the  day of surgery will not be allowed to drive home.    Special instructions:  [facility]- Preparing For Surgery  Before surgery, you can play an important role. Because skin is not sterile, your skin needs to be as free of germs as possible. You can reduce the number of germs on your skin by washing with CHG (chlorahexidine gluconate) Soap before surgery.  CHG is an antiseptic cleaner which kills germs and bonds with the skin to continue killing germs even after washing.    Oral Hygiene is also important to reduce your risk of infection.  Remember - BRUSH YOUR TEETH THE MORNING OF SURGERY WITH YOUR REGULAR TOOTHPASTE  Please do not use if you have an allergy to CHG or antibacterial soaps. If your skin becomes reddened/irritated stop using the CHG.  Do not shave (including legs and underarms) for at least 48 hours prior to first CHG shower. It is OK to shave your face.  Please follow these instructions carefully.   1. Shower the NIGHT BEFORE SURGERY and the MORNING OF SURGERY with CHG.   2. If you chose to wash your hair, wash your hair first as usual with your normal shampoo.  3. After you shampoo,  rinse your hair and body thoroughly to remove the shampoo.  4. Use CHG as you would any other liquid soap. You can apply CHG directly to the skin and wash gently with a scrungie or a clean washcloth.   5. Apply the CHG Soap to your body ONLY FROM THE NECK DOWN.  Do not use on open wounds or open sores. Avoid contact with your eyes, ears, mouth and genitals (private parts). Wash Face and genitals (private parts)  with your normal soap.  6. Wash thoroughly, paying special attention to the area where your surgery will be performed.  7. Thoroughly rinse your body with warm water from the neck down.  8. DO NOT shower/wash with your normal soap after using and rinsing off the CHG Soap.  9. Pat yourself dry with a CLEAN TOWEL.  10. Wear CLEAN PAJAMAS to bed the night before surgery, wear  comfortable clothes the morning of surgery  11. Place CLEAN SHEETS on your bed the night of your first shower and DO NOT SLEEP WITH PETS.    Day of Surgery:  Do not apply any deodorants/lotions.  Please wear clean clothes to the hospital/surgery center.   Remember to brush your teeth WITH YOUR REGULAR TOOTHPASTE.    Please read over the following fact sheets that you were given. Coughing and Deep Breathing and Surgical Site Infection Prevention

## 2020-06-04 ENCOUNTER — Other Ambulatory Visit: Payer: Self-pay

## 2020-06-04 ENCOUNTER — Encounter (HOSPITAL_COMMUNITY): Payer: Self-pay

## 2020-06-04 ENCOUNTER — Encounter (HOSPITAL_COMMUNITY)
Admission: RE | Admit: 2020-06-04 | Discharge: 2020-06-04 | Disposition: A | Discharge status: Home / Self Care | Payer: Medicare PPO | Source: Ambulatory Visit | Attending: Surgery | Admitting: Surgery

## 2020-06-04 ENCOUNTER — Encounter: Payer: Self-pay | Admitting: Genetic Counselor

## 2020-06-04 ENCOUNTER — Other Ambulatory Visit (HOSPITAL_COMMUNITY)
Admission: RE | Admit: 2020-06-04 | Discharge: 2020-06-04 | Disposition: A | Discharge status: Home / Self Care | Payer: Medicare PPO | Source: Ambulatory Visit | Attending: Surgery | Admitting: Surgery

## 2020-06-04 ENCOUNTER — Ambulatory Visit: Payer: Self-pay | Admitting: Genetic Counselor

## 2020-06-04 DIAGNOSIS — Z8679 Personal history of other diseases of the circulatory system: Secondary | ICD-10-CM | POA: Insufficient documentation

## 2020-06-04 DIAGNOSIS — Z1379 Encounter for other screening for genetic and chromosomal anomalies: Secondary | ICD-10-CM

## 2020-06-04 DIAGNOSIS — Z20822 Contact with and (suspected) exposure to covid-19: Secondary | ICD-10-CM | POA: Diagnosis not present

## 2020-06-04 DIAGNOSIS — Z01818 Encounter for other preprocedural examination: Secondary | ICD-10-CM | POA: Diagnosis not present

## 2020-06-04 HISTORY — DX: Personal history of urinary calculi: Z87.442

## 2020-06-04 HISTORY — DX: Unspecified osteoarthritis, unspecified site: M19.90

## 2020-06-04 LAB — CBC WITH DIFFERENTIAL/PLATELET
Abs Immature Granulocytes: 0.03 10*3/uL (ref 0.00–0.07)
Basophils Absolute: 0.1 10*3/uL (ref 0.0–0.1)
Basophils Relative: 1 %
Eosinophils Absolute: 0.2 10*3/uL (ref 0.0–0.5)
Eosinophils Relative: 3 %
HCT: 41.3 % (ref 36.0–46.0)
Hemoglobin: 13 g/dL (ref 12.0–15.0)
Immature Granulocytes: 0 %
Lymphocytes Relative: 28 %
Lymphs Abs: 2.5 10*3/uL (ref 0.7–4.0)
MCH: 27.3 pg (ref 26.0–34.0)
MCHC: 31.5 g/dL (ref 30.0–36.0)
MCV: 86.8 fL (ref 80.0–100.0)
Monocytes Absolute: 0.4 10*3/uL (ref 0.1–1.0)
Monocytes Relative: 5 %
Neutro Abs: 5.6 10*3/uL (ref 1.7–7.7)
Neutrophils Relative %: 63 %
Platelets: 221 10*3/uL (ref 150–400)
RBC: 4.76 MIL/uL (ref 3.87–5.11)
RDW: 13.8 % (ref 11.5–15.5)
WBC: 8.8 10*3/uL (ref 4.0–10.5)
nRBC: 0 % (ref 0.0–0.2)

## 2020-06-04 LAB — COMPREHENSIVE METABOLIC PANEL
ALT: 21 U/L (ref 0–44)
AST: 20 U/L (ref 15–41)
Albumin: 3.9 g/dL (ref 3.5–5.0)
Alkaline Phosphatase: 61 U/L (ref 38–126)
Anion gap: 10 (ref 5–15)
BUN: 14 mg/dL (ref 8–23)
CO2: 27 mmol/L (ref 22–32)
Calcium: 9.9 mg/dL (ref 8.9–10.3)
Chloride: 102 mmol/L (ref 98–111)
Creatinine, Ser: 0.86 mg/dL (ref 0.44–1.00)
GFR calc Af Amer: >60 mL/min (ref >60)
GFR calc non Af Amer: >60 mL/min (ref >60)
Glucose, Bld: 95 mg/dL (ref 70–99)
Potassium: 3.9 mmol/L (ref 3.5–5.1)
Sodium: 139 mmol/L (ref 135–145)
Total Bilirubin: 1.6 mg/dL — ABNORMAL HIGH (ref 0.3–1.2)
Total Protein: 7 g/dL (ref 6.5–8.1)

## 2020-06-04 LAB — SARS CORONAVIRUS 2 (TAT 6-24 HRS): SARS Coronavirus 2: NEGATIVE

## 2020-06-04 NOTE — Progress Notes (Signed)
PCP: Dr. Dennard Schaumann Cardiologist: Dr. Sallyanne Kuster   EKG: Today CXR: n/a ECHO: 08-21-2011 Stress Test: denies Cardiac Cath: denies  Educated regarding ERAS liquids  Had Covid testing this AM, instructed to self quarantine, verbalized understanding  Patient denies shortness of breath, fever, cough, and chest pain at PAT appointment.  Patient verbalized understanding of instructions provided today at the PAT appointment.  Patient asked to review instructions at home and day of surgery.

## 2020-06-04 NOTE — Progress Notes (Signed)
HPI:  Ms. Strauser was previously seen in the [facility] due to a personal and family history of cancer and concerns regarding a hereditary predisposition to cancer. Please refer to our prior cancer genetics clinic note for more information regarding our discussion, assessment and recommendations, at the time. Ms. General recent genetic test results were disclosed to her, as were recommendations warranted by these results. These results and recommendations are discussed in more detail below.  CANCER HISTORY:  Oncology History  malignant neoplasm right central breast  04/27/2020 Breast US   FINDINGS: Mammogram: Spot compression tomosynthesis views of the right breast were performed. There is persistence of architectural distortion in the lower central to slightly outer right breast. The area of distortion spans approximately 0.9 cm. Ultrasound: Targeted ultrasound is performed in the right breast at 6 o'clock 1 cm from the nipple demonstrating an irregular hypoechoic mass measuring 0.8 x 0.6 x 0.6 cm. The surrounding tissues are distorted. No internal blood flow identified. Targeted ultrasound of the right axilla demonstrates normal-appearing lymph nodes.   IMPRESSION: Right breast mass at 6 o'clock measuring 0.8 cm is suspicious and likely corresponds to the distortion seen mammographically.   05/04/2020 Cancer Staging   Staging form: Breast, AJCC 8th Edition - Clinical stage from 05/04/2020: Stage IA (cT1, cN0, cM0, G2, ER+, PR+, HER2-) - Signed by [name], NP on 05/24/2020   05/04/2020 Initial Biopsy   FINAL MICROSCOPIC DIAGNOSIS:  A. BREAST, RIGHT/6:00, BIOPSY:  - Invasive ductal carcinoma.  - Ductal carcinoma in situ.  - Complex sclerosing lesion. COMMENT:  The carcinoma appears grade 2.  The greatest linear extent of tumor in any one core is 7 mm.   Immunohistochemical and morphometric analysis performed manually  The tumor cells are NEGATIVE  for Her2 (1+).  Estrogen Receptor:       POSITIVE, 90% STRONG STAINING  Progesterone Receptor:   POSITIVE, 90% STRONG STAINING  Proliferation Marker Ki-67:   10%    05/06/2020 Initial Diagnosis   malignant neoplasm right central breast   06/02/2020 Genetic Testing   Negative genetic testing:  No pathogenic variants detected on the [facility] Breast Cancer STAT panel + Common Hereditary Cancers Panel. A variant of uncertain significance (VUS) was detected in the AXIN2 gene called c.1235A>C. The report date is 06/02/2020.  The Breast Cancer STAT Panel offered by [facility] includes sequencing and deletion/duplication analysis for the following 9 genes:  ATM, BRCA1, BRCA2, CDH1, CHEK2, PALB2, PTEN, STK11 and TP53. The Common Hereditary Cancers Panel offered by [facility] includes sequencing and/or deletion duplication testing of the following 48 genes: APC, ATM, AXIN2, BARD1, BMPR1A, BRCA1, BRCA2, BRIP1, CDH1, CDK4, CDKN2A (p14ARF), CDKN2A (p16INK4a), CHEK2, CTNNA1, DICER1, EPCAM (Deletion/duplication testing only), GREM1 (promoter region deletion/duplication testing only), KIT, MEN1, MLH1, MSH2, MSH3, MSH6, MUTYH, NBN, NF1, NHTL1, PALB2, PDGFRA, PMS2, POLD1, POLE, PTEN, RAD50, RAD51C, RAD51D, RNF43, SDHB, SDHC, SDHD, SMAD4, SMARCA4. STK11, TP53, TSC1, TSC2, and VHL.  The following genes were evaluated for sequence changes only: SDHA and HOXB13 c.251G>A variant only.     FAMILY HISTORY:  We obtained a detailed, 4-generation family history.  Significant diagnoses are listed below: Family History  Problem Relation Age of Onset  . Hypertension Mother   . Colon cancer Mother 17  . Hypertension Father   . Heart attack Father   . Melanoma Father        dx. in his early 36s (x3)  . Uterine cancer Maternal Aunt  dx. >50  . Cancer Daughter 75       ovarian (dysgerminoma)  . Leukemia Paternal Uncle        dx. in his late 60s  . Stomach cancer Maternal Grandmother        dx. in her 24s  . Bone cancer  Maternal Aunt        dx. in her early 39s  . Lung cancer Maternal Aunt   . Testicular cancer Cousin        dx. in his late 30s/early 51s (maternal cousin)  . Anesthesia problems Neg Hx    Ms. Oesterling has one daughter, Maylene Roes (age 14), and one son (age 45). Her daughter has a history of ovarian cancer (dysgerminoma) diagnosed at the age of 36, treated with surgery and chemotherapy. Ms. Mallari does not have any siblings.  Ms. Wermuth mother died at the age of 12 from colon cancer that had been diagnosed at the age of 23 and metastasized to her liver and lungs. Her mother had normal genetic testing, although they are not sure what type of genetic testing she had (e.g. whether she had tumor or germline testing, or which genes were analyzed). She also had a history of basal cell carcinoma on her face diagnosed in her 34s. Ms. Wilcher mother had six full-sisters, two maternal half-brothers, and two maternal half-sisters. One aunt had bone cancer in her early 24s and another aunt had uterine cancer diagnosed older than 49. There is also lung cancer in the family, although Ms. Tennyson is not sure who had this cancer. One maternal cousin had testicular cancer diagnosed in his late 11s or early 94s. Ms. Gal maternal grandmother had stomach cancer diagnosed in her 57s. She does not have information about her maternal grandfather.  Ms. Walgren father is 20 and has a history of three melanomas removed beginning in his early 32s. She had two paternal uncles and two paternal aunts. One uncle died in his 21s from leukemia, which had been diagnosed in his late 79s. There is no known cancer among paternal cousins. Her paternal grandmother died at the age of 42 and her paternal grandfather died at the age of 14 - neither had cancer.  Ms. Piltz is aware of previous family history of genetic testing for hereditary cancer risks in her mother (normal results, record not available for review). She does  not know her ancestry. There is no reported Ashkenazi Jewish ancestry. There is no known consanguinity.  GENETIC TEST RESULTS: Genetic testing reported out on 06/02/2020 through the [facility] Breast Cancer STAT Panel + Common Hereditary Cancers Panel. No pathogenic variants were detected.   The Breast Cancer STAT Panel offered by [facility] includes sequencing and deletion/duplication analysis for the following 9 genes:  ATM, BRCA1, BRCA2, CDH1, CHEK2, PALB2, PTEN, STK11 and TP53. The Common Hereditary Cancers Panel offered by [facility] includes sequencing and/or deletion duplication testing of the following 48 genes: APC, ATM, AXIN2, BARD1, BMPR1A, BRCA1, BRCA2, BRIP1, CDH1, CDK4, CDKN2A (p14ARF), CDKN2A (p16INK4a), CHEK2, CTNNA1, DICER1, EPCAM (Deletion/duplication testing only), GREM1 (promoter region deletion/duplication testing only), KIT, MEN1, MLH1, MSH2, MSH3, MSH6, MUTYH, NBN, NF1, NHTL1, PALB2, PDGFRA, PMS2, POLD1, POLE, PTEN, RAD50, RAD51C, RAD51D, RNF43, SDHB, SDHC, SDHD, SMAD4, SMARCA4. STK11, TP53, TSC1, TSC2, and VHL.  The following genes were evaluated for sequence changes only: SDHA and HOXB13 c.251G>A variant only. The test report will be scanned into EPIC and located under the Molecular Pathology section of the Results Review tab.  A portion of  the result report is included below for reference.     We discussed with Ms. Hally that because current genetic testing is not perfect, it is possible there may be a gene mutation in one of these genes that current testing cannot detect, but that chance is small.  We also discussed, that there could be another gene that has not yet been discovered, or that we have not yet tested, that is responsible for the cancer diagnoses in the family. It is also possible there is a hereditary cause for the cancer in the family that Ms. Eastburn did not inherit and therefore was not identified in her testing.  Therefore, it is important to remain in touch with cancer  genetics in the future so that we can continue to offer Ms. Michaelson the most up to date genetic testing.   Genetic testing did identify a variant of uncertain significance (VUS) in the AXIN2 gene called c.1235A>C.  At this time, it is unknown if this variant is associated with increased cancer risk or if this is a normal finding, but most variants such as this get reclassified to being inconsequential. It should not be used to make medical management decisions. With time, we suspect the lab will determine the significance of this variant, if any. If we do learn more about it, we will try to contact Ms. Hlavacek to discuss it further. However, it is important to stay in touch with [place] periodically and keep the address and phone number up to date.  CANCER SCREENING RECOMMENDATIONS: Ms. Ratterree test result is considered negative (normal).  This means that we have not identified a hereditary cause for her personal and family history of cancer at this time. Most cancers happen by chance and this negative test suggests that her personal and family of cancer may fall into this category.    While reassuring, this does not definitively rule out a hereditary predisposition to cancer. It is still possible that there could be genetic mutations that are undetectable by current technology. There could be genetic mutations in genes that have not been tested or identified to increase cancer risk.  Therefore, it is recommended she continue to follow the cancer management and screening guidelines provided by her oncology and primary healthcare providers.   An individual's cancer risk and medical management are not determined by genetic test results alone. Overall cancer risk assessment incorporates additional factors, including personal medical history, family history, and any available genetic information that may result in a personalized plan for cancer prevention and surveillance.  RECOMMENDATIONS FOR FAMILY MEMBERS:   Individuals in this family might be at some increased risk of developing cancer, over the general population risk, simply due to the family history of cancer.  We recommended women in this family have a yearly mammogram beginning at age 55, or 14 years younger than the earliest onset of cancer, an annual clinical breast exam, and perform monthly breast self-exams. Women in this family should also have a gynecological exam as recommended by their primary provider. All family members should be referred for colonoscopy starting at age 77.  FOLLOW-UP: Lastly, we discussed with Ms. Rigsbee that cancer genetics is a rapidly advancing field and it is possible that new genetic tests will be appropriate for her and/or her family members in the future. We encouraged her to remain in contact with cancer genetics on an annual basis so we can update her personal and family histories and let her know of advances in cancer genetics that  may benefit this family.   Our contact number was provided. Ms. Killilea questions were answered to her satisfaction, and she knows she is welcome to call us at anytime with additional questions or concerns.   Clint Guy, MS, [facility] Genetic Counselor [email or web address] Phone: [phone]

## 2020-06-04 NOTE — Progress Notes (Signed)
Encounter occurred on 06/03/2020.

## 2020-06-07 ENCOUNTER — Ambulatory Visit
Admission: RE | Admit: 2020-06-07 | Discharge: 2020-06-07 | Disposition: A | Discharge status: Home / Self Care | Payer: Medicare PPO | Source: Ambulatory Visit | Attending: Surgery | Admitting: Surgery

## 2020-06-07 ENCOUNTER — Other Ambulatory Visit: Payer: Self-pay

## 2020-06-07 DIAGNOSIS — C50311 Malignant neoplasm of lower-inner quadrant of right female breast: Secondary | ICD-10-CM | POA: Diagnosis not present

## 2020-06-07 DIAGNOSIS — Z170 Estrogen receptor positive status [ER+]: Secondary | ICD-10-CM

## 2020-06-07 NOTE — Anesthesia Preprocedure Evaluation (Addendum)
Anesthesia Evaluation  Patient identified by MRN, date of birth, ID band Patient awake    Reviewed: Allergy & Precautions, NPO status , Patient's Chart, lab work & pertinent test results  Airway Mallampati: III  TM Distance: >3 FB Neck ROM: Full    Dental no notable dental hx. (+) Teeth Intact, Dental Advisory Given   Pulmonary neg pulmonary ROS,    Pulmonary exam normal breath sounds clear to auscultation       Cardiovascular hypertension, Pt. on medications Normal cardiovascular exam Rhythm:Regular Rate:Normal     Neuro/Psych negative neurological ROS  negative psych ROS   GI/Hepatic negative GI ROS, Neg liver ROS,   Endo/Other  Obesity BMI 34  Renal/GU negative Renal ROS  negative genitourinary   Musculoskeletal  (+) Arthritis , Osteoarthritis,    Abdominal (+) + obese,   Peds negative pediatric ROS (+)  Hematology negative hematology ROS (+)   Anesthesia Other Findings Right breast ca   Reproductive/Obstetrics negative OB ROS                           Anesthesia Physical Anesthesia Plan  ASA: II  Anesthesia Plan: General and Regional   Post-op Pain Management: GA combined w/ Regional for post-op pain   Induction: Intravenous  PONV Risk Score and Plan: 3 and Dexamethasone, Ondansetron, Midazolam and Treatment may vary due to age or medical condition  Airway Management Planned: LMA  Additional Equipment: None  Intra-op Plan:   Post-operative Plan: Extubation in OR  Informed Consent: I have reviewed the patients History and Physical, chart, labs and discussed the procedure including the risks, benefits and alternatives for the proposed anesthesia with the patient or authorized representative who has indicated his/her understanding and acceptance.     Dental advisory given  Plan Discussed with: CRNA  Anesthesia Plan Comments:        Anesthesia Quick Evaluation

## 2020-06-08 ENCOUNTER — Ambulatory Visit (HOSPITAL_COMMUNITY): Payer: Medicare PPO | Admitting: Anesthesiology

## 2020-06-08 ENCOUNTER — Ambulatory Visit
Admission: RE | Admit: 2020-06-08 | Discharge: 2020-06-08 | Disposition: A | Discharge status: Home / Self Care | Payer: Medicare PPO | Source: Ambulatory Visit | Attending: Surgery | Admitting: Surgery

## 2020-06-08 ENCOUNTER — Ambulatory Visit (HOSPITAL_COMMUNITY): Payer: Medicare PPO | Admitting: Vascular Surgery

## 2020-06-08 ENCOUNTER — Encounter (HOSPITAL_COMMUNITY)
Admission: RE | Disposition: A | Discharge status: Home / Self Care | Payer: Self-pay | Source: Home / Self Care | Attending: Surgery

## 2020-06-08 ENCOUNTER — Ambulatory Visit (HOSPITAL_COMMUNITY)
Admission: RE | Admit: 2020-06-08 | Discharge: 2020-06-08 | Disposition: A | Discharge status: Home / Self Care | Payer: Medicare PPO | Attending: Surgery | Admitting: Surgery

## 2020-06-08 ENCOUNTER — Ambulatory Visit (HOSPITAL_COMMUNITY)
Admission: RE | Admit: 2020-06-08 | Discharge: 2020-06-08 | Disposition: A | Discharge status: Home / Self Care | Payer: Medicare PPO | Source: Ambulatory Visit | Attending: Surgery | Admitting: Surgery

## 2020-06-08 ENCOUNTER — Other Ambulatory Visit: Payer: Self-pay

## 2020-06-08 ENCOUNTER — Encounter (HOSPITAL_COMMUNITY): Payer: Self-pay | Admitting: Surgery

## 2020-06-08 DIAGNOSIS — E785 Hyperlipidemia, unspecified: Secondary | ICD-10-CM | POA: Diagnosis not present

## 2020-06-08 DIAGNOSIS — Z8249 Family history of ischemic heart disease and other diseases of the circulatory system: Secondary | ICD-10-CM | POA: Insufficient documentation

## 2020-06-08 DIAGNOSIS — Z170 Estrogen receptor positive status [ER+]: Secondary | ICD-10-CM

## 2020-06-08 DIAGNOSIS — C50311 Malignant neoplasm of lower-inner quadrant of right female breast: Secondary | ICD-10-CM | POA: Insufficient documentation

## 2020-06-08 DIAGNOSIS — C50911 Malignant neoplasm of unspecified site of right female breast: Secondary | ICD-10-CM | POA: Diagnosis not present

## 2020-06-08 DIAGNOSIS — E669 Obesity, unspecified: Secondary | ICD-10-CM | POA: Insufficient documentation

## 2020-06-08 DIAGNOSIS — R928 Other abnormal and inconclusive findings on diagnostic imaging of breast: Secondary | ICD-10-CM | POA: Diagnosis not present

## 2020-06-08 DIAGNOSIS — Z6834 Body mass index (BMI) 34.0-34.9, adult: Secondary | ICD-10-CM | POA: Diagnosis not present

## 2020-06-08 DIAGNOSIS — M1990 Unspecified osteoarthritis, unspecified site: Secondary | ICD-10-CM | POA: Diagnosis not present

## 2020-06-08 DIAGNOSIS — I10 Essential (primary) hypertension: Secondary | ICD-10-CM | POA: Diagnosis not present

## 2020-06-08 HISTORY — PX: BREAST LUMPECTOMY WITH RADIOACTIVE SEED AND SENTINEL LYMPH NODE BIOPSY: SHX6550

## 2020-06-08 SURGERY — BREAST LUMPECTOMY WITH RADIOACTIVE SEED AND SENTINEL LYMPH NODE BIOPSY
Anesthesia: Regional | Site: Breast | Laterality: Right

## 2020-06-08 MED ORDER — MIDAZOLAM HCL 5 MG/5ML IJ SOLN
INTRAMUSCULAR | Status: DC | PRN
  Administered 2020-06-08: 2 mg via INTRAVENOUS

## 2020-06-08 MED ORDER — LIDOCAINE 2% (20 MG/ML) 5 ML SYRINGE
INTRAMUSCULAR | Status: DC | PRN
  Administered 2020-06-08: 20 mg via INTRAVENOUS

## 2020-06-08 MED ORDER — ACETAMINOPHEN 500 MG PO TABS
1000.0000 mg | ORAL_TABLET | Freq: Once | ORAL | Status: AC
  Administered 2020-06-08: 1000 mg via ORAL
  Filled 2020-06-08: qty 2

## 2020-06-08 MED ORDER — MIDAZOLAM HCL 2 MG/2ML IJ SOLN
INTRAMUSCULAR | Status: AC
  Filled 2020-06-08: qty 2

## 2020-06-08 MED ORDER — HYDROCODONE-ACETAMINOPHEN 5-325 MG PO TABS
1.0000 | ORAL_TABLET | Freq: Four times a day (QID) | ORAL | 0 refills | Status: DC | PRN
Start: 2020-06-08 — End: 2020-10-07

## 2020-06-08 MED ORDER — ONDANSETRON HCL 4 MG/2ML IJ SOLN
INTRAMUSCULAR | Status: DC | PRN
  Administered 2020-06-08: 4 mg via INTRAVENOUS

## 2020-06-08 MED ORDER — CEFAZOLIN SODIUM-DEXTROSE 2-4 GM/100ML-% IV SOLN
2.0000 g | INTRAVENOUS | Status: AC
  Administered 2020-06-08: 2 g via INTRAVENOUS
  Filled 2020-06-08: qty 100

## 2020-06-08 MED ORDER — PHENYLEPHRINE HCL-NACL 10-0.9 MG/250ML-% IV SOLN
INTRAVENOUS | Status: DC | PRN
  Administered 2020-06-08: 30 ug/min via INTRAVENOUS

## 2020-06-08 MED ORDER — ORAL CARE MOUTH RINSE: 15.0000 mL | Freq: Once | OROMUCOSAL | Status: AC

## 2020-06-08 MED ORDER — TECHNETIUM TC 99M SULFUR COLLOID FILTERED
1.0000 | Freq: Once | INTRAVENOUS | Status: AC | PRN
  Administered 2020-06-08: 1 via INTRADERMAL

## 2020-06-08 MED ORDER — PROPOFOL 10 MG/ML IV BOLUS
INTRAVENOUS | Status: AC
  Filled 2020-06-08: qty 40

## 2020-06-08 MED ORDER — BUPIVACAINE-EPINEPHRINE 0.25% -1:200000 IJ SOLN
INTRAMUSCULAR | Status: DC | PRN
  Administered 2020-06-08: 14 mL

## 2020-06-08 MED ORDER — PROPOFOL 10 MG/ML IV BOLUS
INTRAVENOUS | Status: DC | PRN
  Administered 2020-06-08: 150 mg via INTRAVENOUS
  Administered 2020-06-08: 50 mg via INTRAVENOUS

## 2020-06-08 MED ORDER — FENTANYL CITRATE (PF) 250 MCG/5ML IJ SOLN
INTRAMUSCULAR | Status: AC
  Filled 2020-06-08: qty 5

## 2020-06-08 MED ORDER — CHLORHEXIDINE GLUCONATE CLOTH 2 % EX PADS: 6.0000 | MEDICATED_PAD | Freq: Once | CUTANEOUS | Status: DC

## 2020-06-08 MED ORDER — EPHEDRINE SULFATE 50 MG/ML IJ SOLN
INTRAMUSCULAR | Status: DC | PRN
Start: 2020-06-08 — End: 2020-06-08
  Administered 2020-06-08: 10 mg via INTRAVENOUS
  Administered 2020-06-08 (×2): 5 mg via INTRAVENOUS

## 2020-06-08 MED ORDER — METHYLENE BLUE 0.5 % INJ SOLN
INTRAVENOUS | Status: AC
  Filled 2020-06-08: qty 10

## 2020-06-08 MED ORDER — FENTANYL CITRATE (PF) 100 MCG/2ML IJ SOLN
INTRAMUSCULAR | Status: DC | PRN
  Administered 2020-06-08: 100 ug via INTRAVENOUS

## 2020-06-08 MED ORDER — 0.9 % SODIUM CHLORIDE (POUR BTL) OPTIME
TOPICAL | Status: DC | PRN
  Administered 2020-06-08: 1000 mL

## 2020-06-08 MED ORDER — CHLORHEXIDINE GLUCONATE 0.12 % MT SOLN
15.0000 mL | Freq: Once | OROMUCOSAL | Status: AC
  Administered 2020-06-08: 15 mL via OROMUCOSAL
  Filled 2020-06-08: qty 15

## 2020-06-08 MED ORDER — BUPIVACAINE HCL (PF) 0.25 % IJ SOLN
INTRAMUSCULAR | Status: AC
  Filled 2020-06-08: qty 30

## 2020-06-08 MED ORDER — DEXAMETHASONE SODIUM PHOSPHATE 4 MG/ML IJ SOLN
INTRAMUSCULAR | Status: DC | PRN
  Administered 2020-06-08: 10 mg via INTRAVENOUS

## 2020-06-08 MED ORDER — LACTATED RINGERS IV SOLN: INTRAVENOUS | Status: DC

## 2020-06-08 SURGICAL SUPPLY — 39 items
APPLIER CLIP 9.375 MED OPEN (MISCELLANEOUS) ×3
BINDER BREAST XLRG (GAUZE/BANDAGES/DRESSINGS) ×3 IMPLANT
CANISTER SUCT 3000ML PPV (MISCELLANEOUS) ×3 IMPLANT
CHLORAPREP W/TINT 26 (MISCELLANEOUS) ×3 IMPLANT
CLIP APPLIE 9.375 MED OPEN (MISCELLANEOUS) ×1 IMPLANT
CNTNR URN SCR LID CUP LEK RST (MISCELLANEOUS) ×1 IMPLANT
CONT SPEC 4OZ STRL OR WHT (MISCELLANEOUS) ×3
COVER PROBE W GEL 5X96 (DRAPES) ×3 IMPLANT
COVER SURGICAL LIGHT HANDLE (MISCELLANEOUS) ×3 IMPLANT
COVER WAND RF STERILE (DRAPES) IMPLANT
DERMABOND ADVANCED (GAUZE/BANDAGES/DRESSINGS) ×2
DERMABOND ADVANCED .7 DNX12 (GAUZE/BANDAGES/DRESSINGS) ×1 IMPLANT
DEVICE DUBIN SPECIMEN MAMMOGRA (MISCELLANEOUS) ×3 IMPLANT
DRAPE CHEST BREAST 15X10 FENES (DRAPES) ×3 IMPLANT
ELECT CAUTERY BLADE 6.4 (BLADE) ×3 IMPLANT
ELECT REM PT RETURN 9FT ADLT (ELECTROSURGICAL) ×3
ELECTRODE REM PT RTRN 9FT ADLT (ELECTROSURGICAL) ×1 IMPLANT
GLOVE BIO SURGEON STRL SZ8 (GLOVE) ×3 IMPLANT
GLOVE BIOGEL PI IND STRL 8 (GLOVE) ×1 IMPLANT
GLOVE BIOGEL PI INDICATOR 8 (GLOVE) ×2
GOWN STRL REUS W/ TWL LRG LVL3 (GOWN DISPOSABLE) ×1 IMPLANT
GOWN STRL REUS W/ TWL XL LVL3 (GOWN DISPOSABLE) ×1 IMPLANT
GOWN STRL REUS W/TWL LRG LVL3 (GOWN DISPOSABLE) ×3
GOWN STRL REUS W/TWL XL LVL3 (GOWN DISPOSABLE) ×3
KIT BASIN OR (CUSTOM PROCEDURE TRAY) ×3 IMPLANT
KIT MARKER MARGIN INK (KITS) ×3 IMPLANT
LIGHT WAVEGUIDE WIDE FLAT (MISCELLANEOUS) IMPLANT
NEEDLE 18GX1X1/2 (RX/OR ONLY) (NEEDLE) IMPLANT
NEEDLE FILTER BLUNT 18X 1/2SAF (NEEDLE)
NEEDLE FILTER BLUNT 18X1 1/2 (NEEDLE) IMPLANT
NEEDLE HYPO 25GX1X1/2 BEV (NEEDLE) ×3 IMPLANT
NS IRRIG 1000ML POUR BTL (IV SOLUTION) ×3 IMPLANT
PACK GENERAL/GYN (CUSTOM PROCEDURE TRAY) ×3 IMPLANT
PAD ABD 8X10 STRL (GAUZE/BANDAGES/DRESSINGS) ×3 IMPLANT
SUT MNCRL AB 4-0 PS2 18 (SUTURE) ×3 IMPLANT
SUT VIC AB 3-0 SH 18 (SUTURE) ×3 IMPLANT
SYR CONTROL 10ML LL (SYRINGE) ×3 IMPLANT
TOWEL GREEN STERILE (TOWEL DISPOSABLE) ×3 IMPLANT
TOWEL GREEN STERILE FF (TOWEL DISPOSABLE) ×3 IMPLANT

## 2020-06-08 NOTE — Anesthesia Procedure Notes (Signed)
Procedure Name: LMA Insertion Date/Time: 06/08/2020 7:35 AM Performed by: Griffin Dakin, CRNA Pre-anesthesia Checklist: Patient identified, Emergency Drugs available, Suction available and Patient being monitored Patient Re-evaluated:Patient Re-evaluated prior to induction Oxygen Delivery Method: Circle system utilized Preoxygenation: Pre-oxygenation with 100% oxygen Induction Type: IV induction Ventilation: Mask ventilation without difficulty LMA: LMA inserted LMA Size: 4.0 Number of attempts: 1 Placement Confirmation: positive ETCO2 and breath sounds checked- equal and bilateral Tube secured with: Tape Dental Injury: Teeth and Oropharynx as per pre-operative assessment

## 2020-06-08 NOTE — Op Note (Signed)
Preoperative diagnosis: Right breast cancer stage I lower inner quadrant  Postoperative diagnosis: Same  Procedure: Right breast seed localized lumpectomy with right axillary sentinel lymph node mapping of deep right axillary lymph node basin.  Surgeon: [name], MD  Anesthesia: LMA with pectoral block local anesthetic of 0.25% Marcaine plain  EBL: Minimal  Specimen: Right breast tissue with seed and clip verified by Faxitron and 1 right axillary sentinel node  Drains: None  IV fluids: Per anesthesia record  Indications for procedure: Patient presents for right breast lumpectomy for stage I right breast cancer.  We also discussed sentinel lymph node mapping and the pros and cons of this.The procedure has been discussed with the patient. Alternatives to surgery have been discussed with the patient.  Risks of surgery include bleeding,  Infection,  Seroma formation, death,  and the need for further surgery.   The patient understands and wishes to proceed.Sentinel lymph node mapping and dissection has been discussed with the patient.  Risk of bleeding,  Infection,  Seroma formation,  Additional procedures,,  Shoulder weakness ,  Shoulder stiffness,  Nerve and blood vessel injury and reaction to the mapping dyes have been discussed.  Alternatives to surgery have been discussed with the patient.  The patient agrees to proceed.   Description of procedure: The patient was met in the holding area.  Questions were answered.  She underwent pectoral block per anesthesia and injection of technetium sulfur colloid.  Radiology protocol.  Neoprobe used to check seed location in the right lower quadrant.  Right side marked as correct.  She was then taken back to the operative room.  She is placed supine upon the OR table.  After induction of general anesthesia, right breast was prepped draped sterile fashion.  Proper patient, site and procedure were verified.  She received appropriate preoperative  antibiotics.  Neoprobe used to identify the seed in the right lower inner quadrant.  Curvilinear incision made along the inferior border of the nipple were complex.  Dissection carried down and all tissue and the seed and clip were excised with a grossly negative margin with the assistance of the neoprobe.  Hemostasis achieved.  Cavity irrigated.  Local anesthetic infiltrated and closed with 3-0 Vicryl and 4 Monocryl.  The sentinel node was done next.  Neoprobe settings changed to technetium.  Hotspot identified in the right axilla.  A 4 cm incision was made.  Dissection carried to the level 1 deep axillary contents.  There was 1 hot node identified and excised.  All background counts approached baseline.  Hemostasis achieved with cautery.  3-0 Vicryl was used for closure as well as 4 Monocryl.  Dermabond applied.  All counts found to be correct.  Breast binder placed.  Patient awoke extubated taken recovery in satisfactory condition.

## 2020-06-08 NOTE — Interval H&P Note (Signed)
History and Physical Interval Note:  06/08/2020 7:16 AM  [name]  has presented today for surgery, with the diagnosis of RIGHT BREAST CANCER.  The various methods of treatment have been discussed with the patient and family. After consideration of risks, benefits and other options for treatment, the patient has consented to  Procedure(s) with comments: RIGHT BREAST LUMPECTOMY WITH RADIOACTIVE SEED AND SENTINEL LYMPH NODE MAPPING (Right) - PEC BLOCK as a surgical intervention.  The patient's history has been reviewed, patient examined, no change in status, stable for surgery.  I have reviewed the patient's chart and labs.  Questions were answered to the patient's satisfaction.     [place]

## 2020-06-08 NOTE — Anesthesia Postprocedure Evaluation (Signed)
Anesthesia Post Note  Patient: MAZEY MANTELL  Procedure(s) Performed: RIGHT BREAST LUMPECTOMY WITH RADIOACTIVE SEED AND SENTINEL LYMPH NODE MAPPING (Right Breast)     Patient location during evaluation: PACU Anesthesia Type: Regional and General Level of consciousness: awake and alert, oriented and patient cooperative Pain management: pain level controlled Vital Signs Assessment: post-procedure vital signs reviewed and stable Respiratory status: spontaneous breathing, nonlabored ventilation and respiratory function stable Cardiovascular status: blood pressure returned to baseline and stable Postop Assessment: no apparent nausea or vomiting Anesthetic complications: no   No complications documented.  Last Vitals:  Vitals:   06/08/20 0910 06/08/20 0918  BP: 129/74 122/71  Pulse:  70  Resp:  13  Temp:  36.8 C  SpO2:  99%    Last Pain:  Vitals:   06/08/20 0918  TempSrc:   PainSc: 0-No pain                 Pervis Hocking

## 2020-06-08 NOTE — Transfer of Care (Signed)
Immediate Anesthesia Transfer of Care Note  Patient: [name]  Procedure(s) Performed: RIGHT BREAST LUMPECTOMY WITH RADIOACTIVE SEED AND SENTINEL LYMPH NODE MAPPING (Right Breast)  Patient Location: PACU  Anesthesia Type:General  Level of Consciousness: awake, alert  and oriented  Airway & Oxygen Therapy: Patient Spontanous Breathing and Patient connected to face mask oxygen  Post-op Assessment: Report given to RN and Post -op Vital signs reviewed and stable  Post vital signs: Reviewed and stable  Last Vitals:  Vitals Value Taken Time  BP 127/71 06/08/20 0845  Temp    Pulse 86 06/08/20 0847  Resp 13 06/08/20 0847  SpO2 95 % 06/08/20 0847  Vitals shown include unvalidated device data.  Last Pain:  Vitals:   06/08/20 0620  TempSrc: Oral  PainSc:          Complications: No complications documented.

## 2020-06-08 NOTE — Discharge Instructions (Signed)
[facility] °Office Phone Number [phone] ° °BREAST BIOPSY/ PARTIAL MASTECTOMY: POST OP INSTRUCTIONS ° °Always review your discharge instruction sheet given to you by the facility where your surgery was performed. ° °IF YOU HAVE DISABILITY OR FAMILY LEAVE FORMS, YOU MUST BRING THEM TO THE OFFICE FOR PROCESSING.  DO NOT GIVE THEM TO YOUR DOCTOR. ° °1. A prescription for pain medication may be given to you upon discharge.  Take your pain medication as prescribed, if needed.  If narcotic pain medicine is not needed, then you may take acetaminophen (Tylenol) or ibuprofen (Advil) as needed. °2. Take your usually prescribed medications unless otherwise directed °3. If you need a refill on your pain medication, please contact your pharmacy.  They will contact our office to request authorization.  Prescriptions will not be filled after 5pm or on week-ends. °4. You should eat very light the first 24 hours after surgery, such as soup, crackers, pudding, etc.  Resume your normal diet the day after surgery. °5. Most patients will experience some swelling and bruising in the breast.  Ice packs and a good support bra will help.  Swelling and bruising can take several days to resolve.  °6. It is common to experience some constipation if taking pain medication after surgery.  Increasing fluid intake and taking a stool softener will usually help or prevent this problem from occurring.  A mild laxative (Milk of Magnesia or Miralax) should be taken according to package directions if there are no bowel movements after 48 hours. °7. Unless discharge instructions indicate otherwise, you may remove your bandages 24-48 hours after surgery, and you may shower at that time.  You may have steri-strips (small skin tapes) in place directly over the incision.  These strips should be left on the skin for 7-10 days.  If your surgeon used skin glue on the incision, you may shower in 24 hours.  The glue will flake off over the  next 2-3 weeks.  Any sutures or staples will be removed at the office during your follow-up visit. °8. ACTIVITIES:  You may resume regular daily activities (gradually increasing) beginning the next day.  Wearing a good support bra or sports bra minimizes pain and swelling.  You may have sexual intercourse when it is comfortable. °a. You may drive when you no longer are taking prescription pain medication, you can comfortably wear a seatbelt, and you can safely maneuver your car and apply brakes. °b. RETURN TO WORK:  ______________________________________________________________________________________ °9. You should see your doctor in the office for a follow-up appointment approximately two weeks after your surgery.  Your doctor’s nurse will typically make your follow-up appointment when she calls you with your pathology report.  Expect your pathology report 2-3 business days after your surgery.  You may call to check if you do not hear from us after three days. °10. OTHER INSTRUCTIONS: _______________________________________________________________________________________________ _____________________________________________________________________________________________________________________________________ °_____________________________________________________________________________________________________________________________________ °_____________________________________________________________________________________________________________________________________ ° °WHEN TO CALL YOUR DOCTOR: °1. Fever over 101.0 °2. Nausea and/or vomiting. °3. Extreme swelling or bruising. °4. Continued bleeding from incision. °5. Increased pain, redness, or drainage from the incision. ° °The clinic staff is available to answer your questions during regular business hours.  Please don’t hesitate to call and ask to speak to one of the nurses for clinical concerns.  If you have a medical emergency, go to the nearest  emergency room or call 911.  A surgeon from [facility] is always on call at the hospital. ° °For further questions, please visit [email or web address]  °

## 2020-06-09 ENCOUNTER — Encounter (HOSPITAL_COMMUNITY): Payer: Self-pay | Admitting: Surgery

## 2020-06-10 LAB — SURGICAL PATHOLOGY

## 2020-06-11 ENCOUNTER — Telehealth: Payer: Self-pay

## 2020-06-11 DIAGNOSIS — Z1211 Encounter for screening for malignant neoplasm of colon: Secondary | ICD-10-CM

## 2020-06-11 NOTE — Telephone Encounter (Signed)
Error

## 2020-06-15 ENCOUNTER — Telehealth: Payer: Self-pay | Admitting: *Deleted

## 2020-06-15 ENCOUNTER — Encounter: Payer: Self-pay | Admitting: *Deleted

## 2020-06-15 NOTE — Telephone Encounter (Signed)
Received order for Oncotype testing. Requisition faxed to pathology and [place].

## 2020-06-22 DIAGNOSIS — Z170 Estrogen receptor positive status [ER+]: Secondary | ICD-10-CM | POA: Diagnosis not present

## 2020-06-22 DIAGNOSIS — C50919 Malignant neoplasm of unspecified site of unspecified female breast: Secondary | ICD-10-CM | POA: Diagnosis not present

## 2020-06-22 NOTE — Progress Notes (Signed)
Location of Breast Cancer: Right breast Cancer  Did patient present with symptoms (if so, please note symptoms) or was this found on screening mammography?: Screening mammogram  Ultrasound: In the right breast at 6 o'clock 1 cm from the nipple demonstrating an irregular hypoechoic mass measuring 0.8 x 0.6 x 0.6 cm.  The surrounding tissues are distorted.  No internal blood flow identified.  Targeted ultrasound of the right axilla demonstrates normal appearing lymph nodes.  Screening mammogram:Spot compression tomo-synthesis views of the right breast were performed.  There is persistence of architectural distortion in the lower central to slightly outer right breast.  The area of distortion spans approximately 0.9 cm.  Histology per Pathology Report:  Right Breast Lumpectomy 06/08/2020  Receptor Status: ER(+ 90%), PR (+ 90%), Her2-neu (-), Ki-67(10%)    Past/Anticipated interventions by surgeon, if any: Dr. Brantley Stage Right Breast Lumpectomy with radioactive seed and SLN mapping 06/08/2020   Past/Anticipated interventions by medical oncology, if any: Chemotherapy  NP Kalman Shan 05/24/2020 -Given her grade 2 tumor >0.5 cm, we are recommending oncotype testing to determine her recurrence risk and whether she would benefit from adjuvant chemo. -If her surgical sentinel lymph node positive, we recommend mammaprint for further risk stratification to guide decision about adjuvant chemotherapy. She is agreeable with oncotype/mammprint. Her strong family history of cancer, including her daughter who had ovarian cancer, is somewhat suggestive of inheritable cancer syndrome or mutation such as BRCA1/BRCA2. Her daughter never had genetic testing. She is agreeable. If she is found to have BRCA mutation, she would consider bilateral prophylactic mastectomy to prevent future breast cancer. If negative, she will proceed with lumpectomy. Lissa Merlin the strong ER and PR expression in her postmenopausal status, we recommend  adjuvant endocrine therapy with aromatase inhibitor for a total of 5-10 years to reduce the risk of cancer recurrence. -Due to her existing arthritis and joint pain, tamoxifen may be a better option.  She is interested, and we'll start after she completes radiation.    Lymphedema issues, if any:   none  Pain issues, if any:  none  SAFETY ISSUES:  Prior radiation? none  Pacemaker/ICD?  none  Possible current pregnancy? Postmenopausal  Is the patient on methotrexate? None  Current Complaints / other details:   -Genetics 06/02/2020: No pathologic variants were detected (negative)   BP 133/75 (BP Location: Left Arm, Patient Position: Sitting)   Pulse 78   Temp 98.9 F (37.2 C) (Oral)   Resp 18   Ht '5\' 3"'  (1.6 m)   Wt 199 lb 6 oz (90.4 kg)   SpO2 98%   BMI 35.32 kg/m    Wt Readings from Last 3 Encounters:  06/24/20 199 lb 6 oz (90.4 kg)  06/08/20 190 lb (86.2 kg)  06/04/20 194 lb 3.6 oz (88.1 kg)   Patient is here with her daughter.    Cori Razor, RN 06/22/2020,12:55 PM

## 2020-06-23 ENCOUNTER — Encounter: Payer: Self-pay | Admitting: Hematology

## 2020-06-23 NOTE — Progress Notes (Signed)
Radiation Oncology         ([phone]) [phone] ________________________________  Initial Outpatient Consultation  Name: [name] MRN: [id number]  Date: 06/24/2020  DOB: [birth year]  EU:[id number], Cammie Mcgee, MD  [name], MD   REFERRING PHYSICIAN: [name], MD  DIAGNOSIS: The encounter diagnosis was Infiltrating ductal carcinoma of right breast ([place]).  Stage IA (pT1c, pN0) Right Breast LIQ, Invasive Ductal Carcinoma with high-grade DCIS, ER+ / PR+ / Her2-, Grade 2  HISTORY OF PRESENT ILLNESS::[name] is a 69 y.o. female who is seen as a courtesy of Dr. Brantley Stage for an opinion concerning radiation therapy as part of management for her recently diagnosed breast cancer. Today, she is accompanied by daughter for evaluation today. She had a routine screening mammography on 04/08/2020 that showed a possible distortion in the right breast. Following this, she underwent a unilateral diagnostic mammography and right breast ultrasonography on 04/27/2020 that showed a suspicious 0.8 cm right breast mass at the 6 o'clock position that likely corresponded to the distortion seen mammographically.  Biopsy on 05/04/2020 revealed grade 2 invasive ductal carcinoma with ductal carcinoma in-situ and a complex sclerosing lesion. Prognostic indicators were significant for estrogen receptor, 90% positive and progesterone receptor, 90% positive, both with strong staining intensities. Proliferation marker Ki67 at 10%. HER2 negative.  The patient was seen by Dr. Brantley Stage on 05/14/2020, during which time it was recommended that she proceed with right breast lumpectomy and sentinel lymph node biopsy.  The patient was referred to medical oncology and was seen by Cira Rue, NP, and Dr. Burr Medico on 05/24/2020. It was recommended that she proceed with Oncotype DX to determine the benefit of chemotherapy, lumpectomy with sentinel lymph node biopsy, adjuvant radiation therapy, and antiestrogen therapy.  Genetic  testing performed on 05/25/2020 was negative with no pathologic variants identified.  Oncotype DX was obtained on the final surgical sample and the recurrence score of 24 predicts a risk of recurrence outside the breast over the next 9 years of 10%, if the patient's only systemic therapy is an antiestrogen for 5 years. It also predicts no significant benefit from chemotherapy.  The patient underwent a right breast seed lumpectomy with right axillary sentinel lymph node mapping of deep right axillary lymph node basin on 06/08/2020 that was performed by Dr. Brantley Stage. Pathology from the procedure revealed grade 2 invasive ductal carcinoma with high-grade ductal carcinoma in-situ with necrosis. The resection margins were negative for invasive carcinoma. In-situ carcinoma was <0.1 cm to the posterior margin broadly. One right axillary sentinel lymph node was excised and biopsied and was negative for carcinoma.  PREVIOUS RADIATION THERAPY: No  PAST MEDICAL HISTORY:  Past Medical History:  Diagnosis Date  . Arthritis   . Family history of bone cancer   . Family history of colon cancer   . Family history of leukemia   . Family history of lung cancer   . Family history of melanoma   . Family history of ovarian cancer   . Family history of stomach cancer   . Family history of uterine cancer   . History of kidney stones   . Hyperlipemia   . Hypertension   . Invasive ductal carcinoma of breast ([place])   . Kidney stones before 2012/01/09   states she has passed stones 4 times/ tsf  . Lower extremity edema   . Obesity     PAST SURGICAL HISTORY: Past Surgical History:  Procedure Laterality Date  . BREAST BIOPSY Left   . BREAST LUMPECTOMY  WITH RADIOACTIVE SEED AND SENTINEL LYMPH NODE BIOPSY Right 06/08/2020   Procedure: RIGHT BREAST LUMPECTOMY WITH RADIOACTIVE SEED AND SENTINEL LYMPH NODE MAPPING;  Surgeon: [name], MD;  Location: [facility];  Service: General;  Laterality: Right;  PEC BLOCK  .  CHOLECYSTECTOMY    . COLONOSCOPY  01/01/2012   Procedure: COLONOSCOPY;  Surgeon: Daneil Dolin, MD;  [facility]: [facility];  Service: Endoscopy;  Laterality: N/A;  10:00 AM  . JOINT REPLACEMENT  2006   left knee  . US ECHOCARDIOGRAPHY  08/21/2011   LA mild to mod. dilated,mild MR,TR    FAMILY HISTORY:  Family History  Problem Relation Age of Onset  . Hypertension Mother   . Colon cancer Mother 19  . Hypertension Father   . Heart attack Father   . Melanoma Father        dx. in his early 77s (x3)  . Uterine cancer Maternal Aunt        dx. >50  . Cancer Daughter 39       ovarian (dysgerminoma)  . Leukemia Paternal Uncle        dx. in his late 4s  . Stomach cancer Maternal Grandmother        dx. in her 1s  . Bone cancer Maternal Aunt        dx. in her early 51s  . Lung cancer Maternal Aunt   . Testicular cancer Cousin        dx. in his late 30s/early 73s (maternal cousin)  . Anesthesia problems Neg Hx     SOCIAL HISTORY:  Social History   Tobacco Use  . Smoking status: Never Smoker  . Smokeless tobacco: Never Used  Vaping Use  . Vaping Use: Never used  Substance Use Topics  . Alcohol use: No  . Drug use: No    ALLERGIES: No Known Allergies  MEDICATIONS:  Current Outpatient Medications  Medication Sig Dispense Refill  . Cholecalciferol (VITAMIN D) 2000 units tablet Take 2 tablets (4,000 Units total) by mouth daily. (Patient not taking: Reported on 05/28/2020)    . HYDROcodone-acetaminophen (NORCO/VICODIN) 5-325 MG tablet Take 1 tablet by mouth every 6 (six) hours as needed for moderate pain. 15 tablet 0  . ibuprofen (ADVIL,MOTRIN) 200 MG tablet Take 800 mg by mouth every 6 (six) hours as needed for moderate pain.     . simvastatin (ZOCOR) 20 MG tablet Take 1 tablet (20 mg total) by mouth daily at 6 PM. Please schedule annual appt with Dr. Sallyanne Kuster for refills. [phone]. 2nd attempt. 90 tablet 3  . triamterene-hydrochlorothiazide (MAXZIDE-25) 37.5-25 MG tablet  Take 1 tablet by mouth daily. 90 tablet 3   No current facility-administered medications for this encounter.    REVIEW OF SYSTEMS:  A 10+ POINT REVIEW OF SYSTEMS WAS OBTAINED including neurology, dermatology, psychiatry, cardiac, respiratory, lymph, extremities, GI, GU, musculoskeletal, constitutional, reproductive, HEENT.  She reports soreness in the right breast but no pain.  She has some mild numbness along the back part of her right upper arm.  She denies any swelling in her right arm or hand   PHYSICAL EXAM:  height is '5\' 3"'  (1.6 m) and weight is 199 lb 6 oz (90.4 kg). Her oral temperature is 98.9 F (37.2 C). Her blood pressure is 133/75 and her pulse is 78. Her respiration is 18 and oxygen saturation is 98%.   General: Alert and oriented, in no acute distress HEENT: Head is normocephalic. Extraocular movements are intact. Oropharynx is clear.  Neck: Neck is supple, no palpable cervical or supraclavicular lymphadenopathy. Heart: Regular in rate and rhythm with no murmurs, rubs, or gallops. Chest: Clear to auscultation bilaterally, with no rhonchi, wheezes, or rales. Abdomen: Soft, nontender, nondistended, with no rigidity or guarding. Extremities: No cyanosis or edema. Lymphatics: see Neck Exam Skin: No concerning lesions. Musculoskeletal: symmetric strength and muscle tone throughout. Neurologic: Cranial nerves II through XII are grossly intact. No obvious focalities. Speech is fluent. Coordination is intact. Psychiatric: Judgment and insight are intact. Affect is appropriate. Left breast: No palpable mass, nipple discharge, or bleeding.  Right breast: Healing well.  She has a scar in the periareolar area, inferiorly, without signs of drainage or infection. she also has a scar in the axillary region shows minimal erythema   medially but no obvious infection  ECOG = 1  0 - Asymptomatic (Fully active, able to carry on all predisease activities without restriction)  1 - Symptomatic but  completely ambulatory (Restricted in physically strenuous activity but ambulatory and able to carry out work of a light or sedentary nature. For example, light housework, office work)  2 - Symptomatic, <50% in bed during the day (Ambulatory and capable of all self care but unable to carry out any work activities. Up and about more than 50% of waking hours)  3 - Symptomatic, >50% in bed, but not bedbound (Capable of only limited self-care, confined to bed or chair 50% or more of waking hours)  4 - Bedbound (Completely disabled. Cannot carry on any self-care. Totally confined to bed or chair)  5 - Death   Eustace Pen MM, Creech RH, Tormey DC, et al. [phone]). "Toxicity and response criteria of the [facility]". [place] Oncol. 5 (6): 649-55  LABORATORY DATA:  Lab Results  Component Value Date   WBC 8.8 06/04/2020   HGB 13.0 06/04/2020   HCT 41.3 06/04/2020   MCV 86.8 06/04/2020   PLT 221 06/04/2020   NEUTROABS 5.6 06/04/2020   Lab Results  Component Value Date   NA 139 06/04/2020   K 3.9 06/04/2020   CL 102 06/04/2020   CO2 27 06/04/2020   GLUCOSE 95 06/04/2020   CREATININE 0.86 06/04/2020   CALCIUM 9.9 06/04/2020      RADIOGRAPHY: NM Sentinel Node Inj-No Rpt (Breast)  Result Date: 06/08/2020 Sulfur colloid was injected by the nuclear medicine technologist for melanoma sentinel node.   MM Breast Surgical Specimen  Result Date: 06/08/2020 CLINICAL DATA:  Post right breast lumpectomy. EXAM: SPECIMEN RADIOGRAPH OF THE RIGHT BREAST COMPARISON:  Previous exam(s). FINDINGS: Status post excision of the right breast. The radioactive seed and ribbon shaped biopsy marker clip are present, completely intact, and were marked for pathology. IMPRESSION: Specimen radiograph of the right breast. Electronically Signed   By: Everlean Alstrom M.D.   On: 06/08/2020 08:11   MM RT RADIOACTIVE SEED LOC MAMMO GUIDE  Result Date: 06/07/2020 CLINICAL DATA:  69 year old with  biopsy-proven malignancy involving the slight LOWER INNER QUADRANT of the RIGHT breast, invasive ductal carcinoma and DCIS associated with a complex sclerosing lesion. Radioactive seed localization is performed in anticipation of lumpectomy which is scheduled for tomorrow. EXAM: MAMMOGRAPHIC GUIDED RADIOACTIVE SEED LOCALIZATION OF THE RIGHT BREAST COMPARISON:  Previous exam(s). FINDINGS: Patient presents for radioactive seed localization prior to RIGHT breast lumpectomy. I met with the patient and we discussed the procedure of seed localization including benefits and alternatives. We discussed the high likelihood of a successful procedure. We discussed the risks of the  procedure including infection, bleeding, tissue injury and further surgery. We discussed the low dose of radioactivity involved in the procedure. Informed, written consent was given. The usual time-out protocol was performed immediately prior to the procedure. Using mammographic guidance, sterile technique with chlorhexidine as skin antisepsis, 1% lidocaine as local anesthetic, an I-125 radioactive seed was used to localize the ribbon shaped tissue marker clip associated with the biopsy-proven malignancy in the LOWER INNER RIGHT breast using a medial approach. The follow-up mammogram images confirm the seed is appropriately positioned adjacent to the ribbon clip. The images are marked for Dr. Brantley Stage. Follow-up survey of the patient confirms the presence of the radioactive seed. Order number of I-125 seed: [id number] Total activity: 0.247 mCi Reference Date: 05/06/2020 The patient tolerated the procedure well and was released from the [facility]. She was given instructions regarding seed removal. IMPRESSION: Radioactive seed localization of the biopsy-proven malignancy involving the LOWER INNER RIGHT breast. No apparent complications. Electronically Signed   By: Evangeline Dakin M.D.   On: 06/07/2020 15:34      IMPRESSION: Stage IA (pT1c, pN0)  Right Breast LIQ, Invasive Ductal Carcinoma with high-grade DCIS, ER+ / PR+ / Her2-, Grade 2  Patient would be at good candidate for breast conservation with radiation therapy directed with the right breast.  Based on exam today she would appear to be a good candidate for hypofractionated accelerated radiation therapy over approximately 4 weeks.  Given the close posterior margin (DCIS) we will give an additional boost dose of radiation to the lumpectomy cavity  Today, I talked to the patient and family about the findings and work-up thus far.  We discussed the natural history of breast cancer and general treatment, highlighting the role of radiotherapy in the management.  We discussed the available radiation techniques, and focused on the details of logistics and delivery.  We reviewed the anticipated acute and late sequelae associated with radiation in this setting.  The patient was encouraged to ask questions that I answered to the best of my ability.  A patient consent form was discussed and signed.  We retained a copy for our records.  The patient would like to proceed with radiation and will be scheduled for CT simulation.  PLAN: Patient will return for CT simulation on August 9.  Treatments to start on the 16th.  Patient and family wanted to delay her treatment slightly given upcoming trip by her daughter overseas in early August.  This slight delay should not be an issue since she is only 2 weeks out from her surgery at this time.  Total time spent in this encounter was 60 minutes which included reviewing the patient's most recent mammogram, ultrasound, biopsy, consultations, genetic testing, oncotype testing, lumpectomy, pathology report, physical examination, and documentation.  ------------------------------------------------  Blair Promise, PhD, MD  This document serves as a record of services personally performed by [name], MD. It was created on his behalf by Clerance Lav, a trained  medical scribe. The creation of this record is based on the scribe's personal observations and the provider's statements to them. This document has been checked and approved by the attending provider.

## 2020-06-24 ENCOUNTER — Ambulatory Visit
Admission: RE | Admit: 2020-06-24 | Discharge: 2020-06-24 | Disposition: A | Discharge status: Home / Self Care | Payer: Medicare PPO | Source: Ambulatory Visit | Attending: Radiation Oncology | Admitting: Radiation Oncology

## 2020-06-24 ENCOUNTER — Encounter: Payer: Self-pay | Admitting: Radiation Oncology

## 2020-06-24 ENCOUNTER — Other Ambulatory Visit: Payer: Self-pay

## 2020-06-24 DIAGNOSIS — Z806 Family history of leukemia: Secondary | ICD-10-CM | POA: Insufficient documentation

## 2020-06-24 DIAGNOSIS — Z9889 Other specified postprocedural states: Secondary | ICD-10-CM | POA: Diagnosis not present

## 2020-06-24 DIAGNOSIS — Z808 Family history of malignant neoplasm of other organs or systems: Secondary | ICD-10-CM | POA: Insufficient documentation

## 2020-06-24 DIAGNOSIS — Z800 Family history of malignant neoplasm of digestive organs: Secondary | ICD-10-CM | POA: Insufficient documentation

## 2020-06-24 DIAGNOSIS — I10 Essential (primary) hypertension: Secondary | ICD-10-CM | POA: Diagnosis not present

## 2020-06-24 DIAGNOSIS — C50911 Malignant neoplasm of unspecified site of right female breast: Secondary | ICD-10-CM

## 2020-06-24 DIAGNOSIS — Z87442 Personal history of urinary calculi: Secondary | ICD-10-CM | POA: Diagnosis not present

## 2020-06-24 DIAGNOSIS — E785 Hyperlipidemia, unspecified: Secondary | ICD-10-CM | POA: Diagnosis not present

## 2020-06-24 DIAGNOSIS — Z79899 Other long term (current) drug therapy: Secondary | ICD-10-CM | POA: Insufficient documentation

## 2020-06-24 DIAGNOSIS — C50311 Malignant neoplasm of lower-inner quadrant of right female breast: Secondary | ICD-10-CM | POA: Diagnosis not present

## 2020-06-24 DIAGNOSIS — Z170 Estrogen receptor positive status [ER+]: Secondary | ICD-10-CM | POA: Insufficient documentation

## 2020-06-25 ENCOUNTER — Telehealth: Payer: Self-pay | Admitting: Hematology

## 2020-06-25 ENCOUNTER — Telehealth: Payer: Self-pay | Admitting: *Deleted

## 2020-06-25 ENCOUNTER — Encounter: Payer: Self-pay | Admitting: General Practice

## 2020-06-25 ENCOUNTER — Encounter: Payer: Self-pay | Admitting: *Deleted

## 2020-06-25 NOTE — Telephone Encounter (Signed)
Scheduled appt per 7/16 sch msg - mailed reminder letter with appt date and time

## 2020-06-25 NOTE — Telephone Encounter (Signed)
Received oncotype results of 24/10%.  Patient is aware.

## 2020-06-25 NOTE — Progress Notes (Signed)
[facility] Psychosocial Distress Screening Clinical Social Work  Clinical Social Work was referred by distress screening protocol.  The patient scored a 6 on the Psychosocial Distress Thermometer which indicates moderate distress. Clinical Social Worker did not contact patient as distress screen stated she declined SW referral to assess for distress and other psychosocial needs.   ONCBCN DISTRESS SCREENING 06/24/2020  Distress experienced in past week (1-10) 6  Other declined social work referral. Has other family issues causing distress.    Clinical Social Worker follow up needed: No.  Pls reconsult if needed.  If yes, follow up plan:  Beverely Pace, [place], LCSW Clinical Social Worker Phone:  [phone]

## 2020-06-29 ENCOUNTER — Encounter: Payer: Self-pay | Admitting: *Deleted

## 2020-07-12 ENCOUNTER — Other Ambulatory Visit: Payer: Self-pay

## 2020-07-12 ENCOUNTER — Ambulatory Visit (INDEPENDENT_AMBULATORY_CARE_PROVIDER_SITE_OTHER): Payer: Medicare PPO | Admitting: Family Medicine

## 2020-07-12 VITALS — BP 124/70 | HR 67 | Temp 97.1°F | Ht 63.0 in | Wt 196.0 lb

## 2020-07-12 DIAGNOSIS — Z20822 Contact with and (suspected) exposure to covid-19: Secondary | ICD-10-CM | POA: Diagnosis not present

## 2020-07-12 DIAGNOSIS — Z124 Encounter for screening for malignant neoplasm of cervix: Secondary | ICD-10-CM | POA: Diagnosis not present

## 2020-07-12 DIAGNOSIS — Z1211 Encounter for screening for malignant neoplasm of colon: Secondary | ICD-10-CM

## 2020-07-12 NOTE — Progress Notes (Signed)
Subjective:    Patient ID: [name], female    DOB: 01/14/1951, 69 y.o.   MRN: [id number]  HPI  I last saw the patient in April for a physical exam.  At that time, I will schedule her for mammogram.  Subsequently she was found to have breast cancer.  Fortunately biopsy-proven lymph nodes to be negative.  She is underwent resection and has radiation scheduled but her prognosis overall is excellent.  However she now wants to proceed with further cancer screening due to anxiety and peace of mind.  She is requesting a Pap smear today.  She denies any history of an abnormal Pap smear.  I explained the indications are that she can quit at age 61 if she has not had an abnormal Pap smear.  However she would feel more comfortable repeating her Pap smear today.  Her last Pap smear was in 2018.  It was normal at that time.  She is also requesting a referral to her gastroenterologist Dr. Gala Romney.  She had a colonoscopy in 2013 that was negative except for diverticulosis.  However she recently had genetic testing that showed she may be at increased risk for colon cancer per her report.  Clinically she is not due for colonoscopy for another 2 years however she would like to talk to Dr. Work about proceeding earlier than planned. Past Medical History:  Diagnosis Date  . Arthritis   . Family history of bone cancer   . Family history of colon cancer   . Family history of leukemia   . Family history of lung cancer   . Family history of melanoma   . Family history of ovarian cancer   . Family history of stomach cancer   . Family history of uterine cancer   . History of kidney stones   . Hyperlipemia   . Hypertension   . Invasive ductal carcinoma of breast ([place])   . Kidney stones before 13-Dec-2011   states she has passed stones 4 times/ tsf  . Lower extremity edema   . Obesity    Past Surgical History:  Procedure Laterality Date  . BREAST BIOPSY Left   . BREAST LUMPECTOMY WITH RADIOACTIVE SEED AND  SENTINEL LYMPH NODE BIOPSY Right 06/08/2020   Procedure: RIGHT BREAST LUMPECTOMY WITH RADIOACTIVE SEED AND SENTINEL LYMPH NODE MAPPING;  Surgeon: [name], MD;  Location: [facility];  Service: General;  Laterality: Right;  PEC BLOCK  . CHOLECYSTECTOMY    . COLONOSCOPY  01/01/2012   Procedure: COLONOSCOPY;  Surgeon: Daneil Dolin, MD;  Location: [facility];  Service: Endoscopy;  Laterality: N/A;  10:00 AM  . JOINT REPLACEMENT  2006   left knee  . US ECHOCARDIOGRAPHY  08/21/2011   LA mild to mod. dilated,mild MR,TR   Current Outpatient Medications on File Prior to Visit  Medication Sig Dispense Refill  . Cholecalciferol (VITAMIN D) 2000 units tablet Take 2 tablets (4,000 Units total) by mouth daily.    Marland Kitchen HYDROcodone-acetaminophen (NORCO/VICODIN) 5-325 MG tablet Take 1 tablet by mouth every 6 (six) hours as needed for moderate pain. 15 tablet 0  . ibuprofen (ADVIL,MOTRIN) 200 MG tablet Take 800 mg by mouth every 6 (six) hours as needed for moderate pain.     . simvastatin (ZOCOR) 20 MG tablet Take 1 tablet (20 mg total) by mouth daily at 6 PM. Please schedule annual appt with Dr. Sallyanne Kuster for refills. [phone]. 2nd attempt. 90 tablet 3  . triamterene-hydrochlorothiazide (MAXZIDE-25) 37.5-25 MG tablet  Take 1 tablet by mouth daily. 90 tablet 3   No current facility-administered medications on file prior to visit.   No Known Allergies Social History   Socioeconomic History  . Marital status: Married    Spouse name: Not on file  . Number of children: 2  . Years of education: Not on file  . Highest education level: Not on file  Occupational History  . Not on file  Tobacco Use  . Smoking status: Never Smoker  . Smokeless tobacco: Never Used  Vaping Use  . Vaping Use: Never used  Substance and Sexual Activity  . Alcohol use: No  . Drug use: No  . Sexual activity: Not on file  Other Topics Concern  . Not on file  Social History Narrative  . Not on file   Social Determinants  of Health   Financial Resource Strain:   . Difficulty of Paying Living Expenses:   Food Insecurity:   . Worried About Charity fundraiser in the Last Year:   . Arboriculturist in the Last Year:   Transportation Needs:   . Film/video editor (Medical):   Marland Kitchen Lack of Transportation (Non-Medical):   Physical Activity:   . Days of Exercise per Week:   . Minutes of Exercise per Session:   Stress:   . Feeling of Stress :   Social Connections:   . Frequency of Communication with Friends and Family:   . Frequency of Social Gatherings with Friends and Family:   . Attends Religious Services:   . Active Member of Clubs or Organizations:   . Attends Archivist Meetings:   Marland Kitchen Marital Status:   Intimate Partner Violence:   . Fear of Current or Ex-Partner:   . Emotionally Abused:   Marland Kitchen Physically Abused:   . Sexually Abused:      Review of Systems  All other systems reviewed and are negative.      Objective:   Physical Exam Vitals reviewed. Exam conducted with a chaperone present.  Constitutional:      General: She is not in acute distress.    Appearance: Normal appearance. She is normal weight. She is not ill-appearing, toxic-appearing or diaphoretic.  HENT:     Head: Normocephalic and atraumatic.     Right Ear: Tympanic membrane, ear canal and external ear normal. There is no impacted cerumen.     Left Ear: Tympanic membrane, ear canal and external ear normal. There is no impacted cerumen.     Nose: Nose normal. No congestion or rhinorrhea.     Mouth/Throat:     Mouth: Mucous membranes are moist.     Pharynx: Oropharynx is clear. No oropharyngeal exudate or posterior oropharyngeal erythema.  Eyes:     General: No scleral icterus.       Right eye: No discharge.        Left eye: No discharge.     Extraocular Movements: Extraocular movements intact.     Conjunctiva/sclera: Conjunctivae normal.     Pupils: Pupils are equal, round, and reactive to light.  Neck:      Vascular: No carotid bruit.  Cardiovascular:     Rate and Rhythm: Normal rate and regular rhythm.     Pulses: Normal pulses.     Heart sounds: Normal heart sounds. No murmur heard.  No friction rub. No gallop.   Pulmonary:     Effort: Pulmonary effort is normal. No respiratory distress.     Breath sounds: Normal  breath sounds. No stridor. No wheezing, rhonchi or rales.  Chest:     Chest wall: No tenderness.  Abdominal:     General: Abdomen is flat. Bowel sounds are normal. There is no distension.     Palpations: Abdomen is soft. There is no mass.     Tenderness: There is no abdominal tenderness. There is no right CVA tenderness, left CVA tenderness, guarding or rebound.     Hernia: No hernia is present.  Genitourinary:    General: Normal vulva.     Exam position: Lithotomy position.     Labia:        Right: No rash.        Left: No rash.      Vagina: Normal. No erythema or lesions.     Cervix: No cervical motion tenderness, friability, lesion or erythema.     Uterus: Normal.      Adnexa: Right adnexa normal and left adnexa normal.       Right: No mass.         Left: No mass.    Musculoskeletal:     Cervical back: Normal range of motion and neck supple. No rigidity.     Right lower leg: No edema.     Left lower leg: No edema.  Lymphadenopathy:     Cervical: No cervical adenopathy.  Skin:    General: Skin is warm.     Coloration: Skin is not jaundiced or pale.     Findings: No bruising, erythema, lesion or rash.  Neurological:     General: No focal deficit present.     Mental Status: She is alert. Mental status is at baseline.     Cranial Nerves: No cranial nerve deficit.     Sensory: No sensory deficit.     Motor: No weakness.     Coordination: Coordination normal.     Gait: Gait normal.     Deep Tendon Reflexes: Reflexes normal.  Psychiatric:        Mood and Affect: Mood normal.        Behavior: Behavior normal.        Thought Content: Thought content normal.         Judgment: Judgment normal.           Assessment & Plan:  Close exposure to COVID-19 virus - Plan: SARS CoV2 Serology(COVID19) AB(IgG,IgM),Immunoassay  Cervical cancer screening - Plan: Pap IG (Image Guided)  Screen for colon cancer - Plan: Ambulatory referral to Gastroenterology  Pap smear was performed today at her request.  Pap smear was sent to pathology in a labeled container.  Patient does not require colonoscopy based on guidelines.  However, the patient would like to talk with her gastroenterologist, Dr. Gala Romney, about proceeding earlier than planned due to her recent genetic testing.  Therefore I will arrange a consultation where she can discuss this with him.  She also was diagnosed with Covid in March.  She would like to check antibody levels to see if she carries immunity.  I explained to the patient that they recommend a vaccine even to those who have had the virus however the patient would like to check for IgG levels prior to receiving the vaccine.

## 2020-07-12 NOTE — Progress Notes (Signed)
  Radiation Oncology         (336) [phone] ________________________________  Name: [name] MRN: [id number]  Date: 07/19/2020  DOB: 31-Aug-1951  SIMULATION AND TREATMENT PLANNING NOTE    ICD-10-CM   1. Infiltrating ductal carcinoma of right breast (HCC)  C50.911     DIAGNOSIS: Stage IA (pT1c, pN0) Right Breast LIQ, Invasive Ductal Carcinoma with high-grade DCIS, ER+ / PR+ / Her2-, Grade 2  NARRATIVE:  The patient was brought to the [facility].  Identity was confirmed.  All relevant records and images related to the planned course of therapy were reviewed.  The patient freely provided informed written consent to proceed with treatment after reviewing the details related to the planned course of therapy. The consent form was witnessed and verified by the simulation staff.  Then, the patient was set-up in a stable reproducible  supine position for radiation therapy.  CT images were obtained.  Surface markings were placed.  The CT images were loaded into the planning software.  Then the target and avoidance structures were contoured.  Treatment planning then occurred.  The radiation prescription was entered and confirmed.  Then, I designed and supervised the construction of a total of 5 medically necessary complex treatment devices.  I have requested : 3D Simulation  I have requested a DVH of the following structures: Heart, lungs, lumpectomy cavity.  I have ordered:dose calc.  PLAN:  The patient will receive 40.05 Gy in 15 fractions followed by a boost to the lumpectomy cavity of 12 Gy in six fractions    Optical Surface Tracking Plan:  Since intensity modulated radiotherapy (IMRT) and 3D conformal radiation treatment methods are predicated on accurate and precise positioning for treatment, intrafraction motion monitoring is medically necessary to ensure accurate and safe treatment delivery.  The ability to quantify intrafraction motion without excessive ionizing radiation  dose can only be performed with optical surface tracking. Accordingly, surface imaging offers the opportunity to obtain 3D measurements of patient position throughout IMRT and 3D treatments without excessive radiation exposure.  I am ordering optical surface tracking for this patient's upcoming course of radiotherapy. ________________________________    Blair Promise, PhD, MD  This document serves as a record of services personally performed by [name], MD. It was created on his behalf by Clerance Lav, a trained medical scribe. The creation of this record is based on the scribe's personal observations and the provider's statements to them. This document has been checked and approved by the attending provider.

## 2020-07-13 ENCOUNTER — Encounter: Payer: Self-pay | Admitting: Nurse Practitioner

## 2020-07-13 LAB — PAP IG (IMAGE GUIDED)

## 2020-07-13 LAB — SARS COV-2 SEROLOGY(COVID-19)AB(IGG,IGM),IMMUNOASSAY
SARS CoV-2 AB IgG: POSITIVE — AB
SARS CoV-2 IgM: NEGATIVE

## 2020-07-19 ENCOUNTER — Ambulatory Visit
Admission: RE | Admit: 2020-07-19 | Discharge: 2020-07-19 | Disposition: A | Discharge status: Home / Self Care | Payer: Medicare PPO | Source: Ambulatory Visit | Attending: Radiation Oncology | Admitting: Radiation Oncology

## 2020-07-19 ENCOUNTER — Other Ambulatory Visit: Payer: Self-pay

## 2020-07-19 DIAGNOSIS — C50311 Malignant neoplasm of lower-inner quadrant of right female breast: Secondary | ICD-10-CM | POA: Diagnosis not present

## 2020-07-19 DIAGNOSIS — Z510 Encounter for antineoplastic radiation therapy: Secondary | ICD-10-CM | POA: Diagnosis not present

## 2020-07-19 DIAGNOSIS — C50911 Malignant neoplasm of unspecified site of right female breast: Secondary | ICD-10-CM

## 2020-07-22 DIAGNOSIS — C50311 Malignant neoplasm of lower-inner quadrant of right female breast: Secondary | ICD-10-CM | POA: Diagnosis not present

## 2020-07-22 DIAGNOSIS — Z510 Encounter for antineoplastic radiation therapy: Secondary | ICD-10-CM | POA: Diagnosis not present

## 2020-07-25 NOTE — Progress Notes (Signed)
  Radiation Oncology         (336) [phone] ________________________________  Name: [name] MRN: [id number]  Date: 07/26/2020  DOB: January 08, 1951  Simulation Verification Note    ICD-10-CM   1. Infiltrating ductal carcinoma of right breast ([facility])  C50.911     NARRATIVE: The patient was brought to the treatment unit and placed in the planned treatment position. The clinical setup was verified. Then port films were obtained and uploaded to the radiation oncology medical record software.  The treatment beams were carefully compared against the planned radiation fields. The position location and shape of the radiation fields was reviewed. The targeted volume of tissue appears to be appropriately covered by the radiation beams. Organs at risk appear to be excluded as planned.  Based on my personal review, I approved the simulation verification. The patient's treatment will proceed as planned.  -----------------------------------  Blair Promise, PhD, MD  This document serves as a record of services personally performed by [name], MD. It was created on his behalf by Clerance Lav, a trained medical scribe. The creation of this record is based on the scribe's personal observations and the provider's statements to them. This document has been checked and approved by the attending provider.

## 2020-07-26 ENCOUNTER — Other Ambulatory Visit: Payer: Self-pay

## 2020-07-26 ENCOUNTER — Ambulatory Visit
Admission: RE | Admit: 2020-07-26 | Discharge: 2020-07-26 | Disposition: A | Discharge status: Home / Self Care | Payer: Medicare PPO | Source: Ambulatory Visit | Attending: Radiation Oncology | Admitting: Radiation Oncology

## 2020-07-26 DIAGNOSIS — C50911 Malignant neoplasm of unspecified site of right female breast: Secondary | ICD-10-CM

## 2020-07-26 DIAGNOSIS — C50311 Malignant neoplasm of lower-inner quadrant of right female breast: Secondary | ICD-10-CM | POA: Diagnosis not present

## 2020-07-26 DIAGNOSIS — Z510 Encounter for antineoplastic radiation therapy: Secondary | ICD-10-CM | POA: Diagnosis not present

## 2020-07-27 ENCOUNTER — Other Ambulatory Visit: Payer: Self-pay

## 2020-07-27 ENCOUNTER — Ambulatory Visit
Admission: RE | Admit: 2020-07-27 | Discharge: 2020-07-27 | Disposition: A | Discharge status: Home / Self Care | Payer: Medicare PPO | Source: Ambulatory Visit | Attending: Radiation Oncology | Admitting: Radiation Oncology

## 2020-07-27 DIAGNOSIS — Z510 Encounter for antineoplastic radiation therapy: Secondary | ICD-10-CM | POA: Diagnosis not present

## 2020-07-27 DIAGNOSIS — C50311 Malignant neoplasm of lower-inner quadrant of right female breast: Secondary | ICD-10-CM | POA: Diagnosis not present

## 2020-07-27 DIAGNOSIS — C50911 Malignant neoplasm of unspecified site of right female breast: Secondary | ICD-10-CM

## 2020-07-27 MED ORDER — SONAFINE EX EMUL
1.0000 "application " | Freq: Two times a day (BID) | CUTANEOUS | Status: DC
  Administered 2020-07-27: 1 via TOPICAL

## 2020-07-27 MED ORDER — ALRA NON-METALLIC DEODORANT (RAD-ONC)
1.0000 "application " | Freq: Once | TOPICAL | Status: AC
  Administered 2020-07-27: 1 via TOPICAL

## 2020-07-28 ENCOUNTER — Ambulatory Visit
Admission: RE | Admit: 2020-07-28 | Discharge: 2020-07-28 | Disposition: A | Discharge status: Home / Self Care | Payer: Medicare PPO | Source: Ambulatory Visit | Attending: Radiation Oncology | Admitting: Radiation Oncology

## 2020-07-28 ENCOUNTER — Other Ambulatory Visit: Payer: Self-pay

## 2020-07-28 DIAGNOSIS — Z510 Encounter for antineoplastic radiation therapy: Secondary | ICD-10-CM | POA: Diagnosis not present

## 2020-07-28 DIAGNOSIS — C50311 Malignant neoplasm of lower-inner quadrant of right female breast: Secondary | ICD-10-CM | POA: Diagnosis not present

## 2020-07-29 ENCOUNTER — Other Ambulatory Visit: Payer: Self-pay

## 2020-07-29 ENCOUNTER — Ambulatory Visit
Admission: RE | Admit: 2020-07-29 | Discharge: 2020-07-29 | Disposition: A | Discharge status: Home / Self Care | Payer: Medicare PPO | Source: Ambulatory Visit | Attending: Radiation Oncology | Admitting: Radiation Oncology

## 2020-07-29 DIAGNOSIS — Z510 Encounter for antineoplastic radiation therapy: Secondary | ICD-10-CM | POA: Diagnosis not present

## 2020-07-29 DIAGNOSIS — C50311 Malignant neoplasm of lower-inner quadrant of right female breast: Secondary | ICD-10-CM | POA: Diagnosis not present

## 2020-07-30 ENCOUNTER — Ambulatory Visit
Admission: RE | Admit: 2020-07-30 | Discharge: 2020-07-30 | Disposition: A | Discharge status: Home / Self Care | Payer: Medicare PPO | Source: Ambulatory Visit | Attending: Radiation Oncology | Admitting: Radiation Oncology

## 2020-07-30 ENCOUNTER — Other Ambulatory Visit: Payer: Self-pay

## 2020-07-30 DIAGNOSIS — Z510 Encounter for antineoplastic radiation therapy: Secondary | ICD-10-CM | POA: Diagnosis not present

## 2020-07-30 DIAGNOSIS — C50311 Malignant neoplasm of lower-inner quadrant of right female breast: Secondary | ICD-10-CM | POA: Diagnosis not present

## 2020-08-02 ENCOUNTER — Ambulatory Visit: Payer: Medicare PPO

## 2020-08-03 ENCOUNTER — Ambulatory Visit
Admission: RE | Admit: 2020-08-03 | Discharge: 2020-08-03 | Disposition: A | Discharge status: Home / Self Care | Payer: Medicare PPO | Source: Ambulatory Visit | Attending: Radiation Oncology | Admitting: Radiation Oncology

## 2020-08-03 ENCOUNTER — Other Ambulatory Visit: Payer: Self-pay

## 2020-08-03 DIAGNOSIS — C50311 Malignant neoplasm of lower-inner quadrant of right female breast: Secondary | ICD-10-CM | POA: Diagnosis not present

## 2020-08-03 DIAGNOSIS — Z510 Encounter for antineoplastic radiation therapy: Secondary | ICD-10-CM | POA: Diagnosis not present

## 2020-08-04 ENCOUNTER — Other Ambulatory Visit: Payer: Self-pay

## 2020-08-04 ENCOUNTER — Ambulatory Visit
Admission: RE | Admit: 2020-08-04 | Discharge: 2020-08-04 | Disposition: A | Discharge status: Home / Self Care | Payer: Medicare PPO | Source: Ambulatory Visit | Attending: Radiation Oncology | Admitting: Radiation Oncology

## 2020-08-04 DIAGNOSIS — C50311 Malignant neoplasm of lower-inner quadrant of right female breast: Secondary | ICD-10-CM | POA: Diagnosis not present

## 2020-08-04 DIAGNOSIS — Z510 Encounter for antineoplastic radiation therapy: Secondary | ICD-10-CM | POA: Diagnosis not present

## 2020-08-05 ENCOUNTER — Ambulatory Visit
Admission: RE | Admit: 2020-08-05 | Discharge: 2020-08-05 | Disposition: A | Discharge status: Home / Self Care | Payer: Medicare PPO | Source: Ambulatory Visit | Attending: Radiation Oncology | Admitting: Radiation Oncology

## 2020-08-05 ENCOUNTER — Other Ambulatory Visit: Payer: Self-pay

## 2020-08-05 DIAGNOSIS — C50311 Malignant neoplasm of lower-inner quadrant of right female breast: Secondary | ICD-10-CM | POA: Diagnosis not present

## 2020-08-05 DIAGNOSIS — Z510 Encounter for antineoplastic radiation therapy: Secondary | ICD-10-CM | POA: Diagnosis not present

## 2020-08-06 ENCOUNTER — Other Ambulatory Visit: Payer: Self-pay

## 2020-08-06 ENCOUNTER — Ambulatory Visit
Admission: RE | Admit: 2020-08-06 | Discharge: 2020-08-06 | Disposition: A | Discharge status: Home / Self Care | Payer: Medicare PPO | Source: Ambulatory Visit | Attending: Radiation Oncology | Admitting: Radiation Oncology

## 2020-08-06 DIAGNOSIS — Z510 Encounter for antineoplastic radiation therapy: Secondary | ICD-10-CM | POA: Diagnosis not present

## 2020-08-06 DIAGNOSIS — C50311 Malignant neoplasm of lower-inner quadrant of right female breast: Secondary | ICD-10-CM | POA: Diagnosis not present

## 2020-08-09 ENCOUNTER — Other Ambulatory Visit: Payer: Self-pay

## 2020-08-09 ENCOUNTER — Ambulatory Visit
Admission: RE | Admit: 2020-08-09 | Discharge: 2020-08-09 | Disposition: A | Discharge status: Home / Self Care | Payer: Medicare PPO | Source: Ambulatory Visit | Attending: Radiation Oncology | Admitting: Radiation Oncology

## 2020-08-09 DIAGNOSIS — Z510 Encounter for antineoplastic radiation therapy: Secondary | ICD-10-CM | POA: Diagnosis not present

## 2020-08-09 DIAGNOSIS — C50311 Malignant neoplasm of lower-inner quadrant of right female breast: Secondary | ICD-10-CM | POA: Diagnosis not present

## 2020-08-10 ENCOUNTER — Ambulatory Visit
Admission: RE | Admit: 2020-08-10 | Discharge: 2020-08-10 | Disposition: A | Discharge status: Home / Self Care | Payer: Medicare PPO | Source: Ambulatory Visit | Attending: Radiation Oncology | Admitting: Radiation Oncology

## 2020-08-10 ENCOUNTER — Other Ambulatory Visit: Payer: Self-pay

## 2020-08-10 DIAGNOSIS — Z510 Encounter for antineoplastic radiation therapy: Secondary | ICD-10-CM | POA: Diagnosis not present

## 2020-08-10 DIAGNOSIS — C50311 Malignant neoplasm of lower-inner quadrant of right female breast: Secondary | ICD-10-CM | POA: Diagnosis not present

## 2020-08-11 ENCOUNTER — Other Ambulatory Visit: Payer: Self-pay

## 2020-08-11 ENCOUNTER — Ambulatory Visit
Admission: RE | Admit: 2020-08-11 | Discharge: 2020-08-11 | Disposition: A | Discharge status: Home / Self Care | Payer: Medicare PPO | Source: Ambulatory Visit | Attending: Radiation Oncology | Admitting: Radiation Oncology

## 2020-08-11 DIAGNOSIS — C50311 Malignant neoplasm of lower-inner quadrant of right female breast: Secondary | ICD-10-CM | POA: Insufficient documentation

## 2020-08-11 DIAGNOSIS — Z510 Encounter for antineoplastic radiation therapy: Secondary | ICD-10-CM | POA: Diagnosis not present

## 2020-08-12 ENCOUNTER — Other Ambulatory Visit: Payer: Self-pay

## 2020-08-12 ENCOUNTER — Ambulatory Visit
Admission: RE | Admit: 2020-08-12 | Discharge: 2020-08-12 | Disposition: A | Discharge status: Home / Self Care | Payer: Medicare PPO | Source: Ambulatory Visit | Attending: Radiation Oncology | Admitting: Radiation Oncology

## 2020-08-12 DIAGNOSIS — C50311 Malignant neoplasm of lower-inner quadrant of right female breast: Secondary | ICD-10-CM | POA: Diagnosis not present

## 2020-08-12 DIAGNOSIS — Z510 Encounter for antineoplastic radiation therapy: Secondary | ICD-10-CM | POA: Diagnosis not present

## 2020-08-13 ENCOUNTER — Ambulatory Visit
Admission: RE | Admit: 2020-08-13 | Discharge: 2020-08-13 | Disposition: A | Discharge status: Home / Self Care | Payer: Medicare PPO | Source: Ambulatory Visit | Attending: Radiation Oncology | Admitting: Radiation Oncology

## 2020-08-13 ENCOUNTER — Other Ambulatory Visit: Payer: Self-pay

## 2020-08-13 DIAGNOSIS — Z510 Encounter for antineoplastic radiation therapy: Secondary | ICD-10-CM | POA: Diagnosis not present

## 2020-08-13 DIAGNOSIS — C50311 Malignant neoplasm of lower-inner quadrant of right female breast: Secondary | ICD-10-CM | POA: Diagnosis not present

## 2020-08-17 ENCOUNTER — Ambulatory Visit: Admission: RE | Admit: 2020-08-17 | Payer: Medicare PPO | Source: Ambulatory Visit | Admitting: Radiation Oncology

## 2020-08-17 ENCOUNTER — Other Ambulatory Visit: Payer: Self-pay

## 2020-08-17 ENCOUNTER — Ambulatory Visit: Payer: Medicare PPO

## 2020-08-17 ENCOUNTER — Ambulatory Visit
Admission: RE | Admit: 2020-08-17 | Discharge: 2020-08-17 | Disposition: A | Discharge status: Home / Self Care | Payer: Medicare PPO | Source: Ambulatory Visit | Attending: Radiation Oncology | Admitting: Radiation Oncology

## 2020-08-17 DIAGNOSIS — Z510 Encounter for antineoplastic radiation therapy: Secondary | ICD-10-CM | POA: Diagnosis not present

## 2020-08-17 DIAGNOSIS — C50311 Malignant neoplasm of lower-inner quadrant of right female breast: Secondary | ICD-10-CM | POA: Diagnosis not present

## 2020-08-18 ENCOUNTER — Other Ambulatory Visit: Payer: Self-pay

## 2020-08-18 ENCOUNTER — Ambulatory Visit: Payer: Medicare PPO

## 2020-08-18 ENCOUNTER — Ambulatory Visit
Admission: RE | Admit: 2020-08-18 | Discharge: 2020-08-18 | Disposition: A | Discharge status: Home / Self Care | Payer: Medicare PPO | Source: Ambulatory Visit | Attending: Radiation Oncology | Admitting: Radiation Oncology

## 2020-08-18 DIAGNOSIS — C50311 Malignant neoplasm of lower-inner quadrant of right female breast: Secondary | ICD-10-CM | POA: Diagnosis not present

## 2020-08-18 DIAGNOSIS — Z510 Encounter for antineoplastic radiation therapy: Secondary | ICD-10-CM | POA: Diagnosis not present

## 2020-08-19 ENCOUNTER — Other Ambulatory Visit: Payer: Self-pay

## 2020-08-19 ENCOUNTER — Ambulatory Visit
Admission: RE | Admit: 2020-08-19 | Discharge: 2020-08-19 | Disposition: A | Discharge status: Home / Self Care | Payer: Medicare PPO | Source: Ambulatory Visit | Attending: Radiation Oncology | Admitting: Radiation Oncology

## 2020-08-19 DIAGNOSIS — Z510 Encounter for antineoplastic radiation therapy: Secondary | ICD-10-CM | POA: Diagnosis not present

## 2020-08-19 DIAGNOSIS — C50311 Malignant neoplasm of lower-inner quadrant of right female breast: Secondary | ICD-10-CM | POA: Diagnosis not present

## 2020-08-20 ENCOUNTER — Ambulatory Visit
Admission: RE | Admit: 2020-08-20 | Discharge: 2020-08-20 | Disposition: A | Discharge status: Home / Self Care | Payer: Medicare PPO | Source: Ambulatory Visit | Attending: Radiation Oncology | Admitting: Radiation Oncology

## 2020-08-20 ENCOUNTER — Other Ambulatory Visit: Payer: Self-pay

## 2020-08-20 DIAGNOSIS — Z510 Encounter for antineoplastic radiation therapy: Secondary | ICD-10-CM | POA: Diagnosis not present

## 2020-08-20 DIAGNOSIS — C50311 Malignant neoplasm of lower-inner quadrant of right female breast: Secondary | ICD-10-CM | POA: Diagnosis not present

## 2020-08-20 NOTE — Progress Notes (Signed)
[name]   [phone]) [phone] Fax:(336) [phone]   Clinic Follow up Note   Patient Care Team: [name], MD as PCP - General (Family Medicine) [name], RN as Oncology Nurse Navigator [name], RN as Oncology Nurse Navigator [name], MD as Consulting Physician (General Surgery) [name], MD as Consulting Physician (Hematology)  Date of Service:  08/23/2020  CHIEF COMPLAINT: F/u of right breast cancer   SUMMARY OF ONCOLOGIC HISTORY: Oncology History Overview Note  Cancer Staging malignant neoplasm right central breast Staging form: Breast, AJCC 8th Edition - Clinical stage from 05/04/2020: Stage IA (cT1, cN0, cM0, G2, ER+, PR+, HER2-) - Signed by [name], NP on 05/24/2020 - Pathologic stage from 06/08/2020: Stage IA (pT1c, pN0, cM0, G2, ER+, PR+, HER2-, Oncotype DX score: 24) - Signed by [name], MD on 08/22/2020    malignant neoplasm right central breast  04/27/2020 Breast US   FINDINGS: Mammogram: Spot compression tomosynthesis views of the right breast were performed. There is persistence of architectural distortion in the lower central to slightly outer right breast. The area of distortion spans approximately 0.9 cm. Ultrasound: Targeted ultrasound is performed in the right breast at 6 o'clock 1 cm from the nipple demonstrating an irregular hypoechoic mass measuring 0.8 x 0.6 x 0.6 cm. The surrounding tissues are distorted. No internal blood flow identified. Targeted ultrasound of the right axilla demonstrates normal-appearing lymph nodes.   IMPRESSION: Right breast mass at 6 o'clock measuring 0.8 cm is suspicious and likely corresponds to the distortion seen mammographically.   05/04/2020 Cancer Staging   Staging form: Breast, AJCC 8th Edition - Clinical stage from 05/04/2020: Stage IA (cT1, cN0, cM0, G2, ER+, PR+, HER2-) - Signed by [name], NP on 05/24/2020   05/04/2020 Initial Biopsy   FINAL  MICROSCOPIC DIAGNOSIS:  A. BREAST, RIGHT/6:00, BIOPSY:  - Invasive ductal carcinoma.  - Ductal carcinoma in situ.  - Complex sclerosing lesion. COMMENT:  The carcinoma appears grade 2.  The greatest linear extent of tumor in any one core is 7 mm.   Immunohistochemical and morphometric analysis performed manually  The tumor cells are NEGATIVE for Her2 (1+).  Estrogen Receptor:       POSITIVE, 90% STRONG STAINING  Progesterone Receptor:   POSITIVE, 90% STRONG STAINING  Proliferation Marker Ki-67:   10%    05/06/2020 Initial Diagnosis   malignant neoplasm right central breast   06/02/2020 Genetic Testing   Negative genetic testing:  No pathogenic variants detected on the [facility] Breast Cancer STAT panel + Common Hereditary Cancers Panel. A variant of uncertain significance (VUS) was detected in the AXIN2 gene called c.1235A>C. The report date is 06/02/2020.  The Breast Cancer STAT Panel offered by [facility] includes sequencing and deletion/duplication analysis for the following 9 genes:  ATM, BRCA1, BRCA2, CDH1, CHEK2, PALB2, PTEN, STK11 and TP53. The Common Hereditary Cancers Panel offered by [facility] includes sequencing and/or deletion duplication testing of the following 48 genes: APC, ATM, AXIN2, BARD1, BMPR1A, BRCA1, BRCA2, BRIP1, CDH1, CDK4, CDKN2A (p14ARF), CDKN2A (p16INK4a), CHEK2, CTNNA1, DICER1, EPCAM (Deletion/duplication testing only), GREM1 (promoter region deletion/duplication testing only), KIT, MEN1, MLH1, MSH2, MSH3, MSH6, MUTYH, NBN, NF1, NHTL1, PALB2, PDGFRA, PMS2, POLD1, POLE, PTEN, RAD50, RAD51C, RAD51D, RNF43, SDHB, SDHC, SDHD, SMAD4, SMARCA4. STK11, TP53, TSC1, TSC2, and VHL.  The following genes were evaluated for sequence changes only: SDHA and HOXB13 c.251G>A variant only.   06/08/2020 Cancer Staging   Staging form: Breast, AJCC 8th Edition -  Pathologic stage from 06/08/2020: Stage IA (pT1c, pN0, cM0, G2, ER+, PR+, HER2-, Oncotype DX score: 24) - Signed by [name], MD on  08/22/2020   06/08/2020 Surgery   RIGHT BREAST LUMPECTOMY WITH RADIOACTIVE SEED AND SENTINEL LYMPH NODE MAPPING with Dr Brantley Stage    06/08/2020 Pathology Results   FINAL MICROSCOPIC DIAGNOSIS:   A. BREAST, RIGHT, LUMPECTOMY:  - Invasive ductal carcinoma, grade 2, spanning 1.1 cm.  - High grade ductal carcinoma in situ with necrosis.  - Biopsy site.  - Resection margins negative for invasive carcinoma.  - In situ carcinoma is <0.1 cm to the posterior margin broadly, see  comment.  - See oncology table.   B. LYMPH NODE, RIGHT AXILLARY, SENTINEL, EXCISION:  - One of one lymph nodes negative for carcinoma (0/1).    06/08/2020 Oncotype testing   Recurrence score 24  Distant recurrence risk at 9 years with AI or Tamoxifen alone is 10% There is less than 1% benefit of adjuvant chemotherapy.    07/26/2020 - 08/25/2020 Radiation Therapy   Adjuvant Radiation with Dr [name] 07/26/20-08/25/20   09/2020 -  Anti-estrogen oral therapy   Exemestane 25mg  once daily starting in 09/2020      CURRENT THERAPY:  Adjuvant Radiation with Dr [name] 07/26/20-08/25/20 Exemestane 25mg  once daily starting in 09/2020  INTERVAL HISTORY:  [name] is here for a follow up of right breast cancer. She presents to the clinic with her family member. She notes she is tolerating RT well with mild skin irration and fatigue. She has significant arthritis in her knees, hips and back. Although she has elevated tbili in 05/2020. She denies jaundice or dark urine.    REVIEW OF SYSTEMS:   Constitutional: Denies fevers, chills or abnormal weight loss (+) Mild fatigue  Eyes: Denies blurriness of vision Ears, nose, mouth, throat, and face: Denies mucositis or sore throat Respiratory: Denies cough, dyspnea or wheezes Cardiovascular: Denies palpitation, chest discomfort or lower extremity swelling Gastrointestinal:  Denies nausea, heartburn or change in bowel habits Skin: Denies abnormal skin rashes MSK: (+) Arthritis in  knees, hips, back Lymphatics: Denies new lymphadenopathy or easy bruising Neurological:Denies numbness, tingling or new weaknesses Behavioral/Psych: Mood is stable, no new changes  All other systems were reviewed with the patient and are negative.  MEDICAL HISTORY:  Past Medical History:  Diagnosis Date   Arthritis    Family history of bone cancer    Family history of colon cancer    Family history of leukemia    Family history of lung cancer    Family history of melanoma    Family history of ovarian cancer    Family history of stomach cancer    Family history of uterine cancer    History of kidney stones    Hyperlipemia    Hypertension    Invasive ductal carcinoma of breast ([place])    Kidney stones before 12/19/2011   states she has passed stones 4 times/ tsf   Lower extremity edema    Obesity     SURGICAL HISTORY: Past Surgical History:  Procedure Laterality Date   BREAST BIOPSY Left    BREAST LUMPECTOMY WITH RADIOACTIVE SEED AND SENTINEL LYMPH NODE BIOPSY Right 06/08/2020   Procedure: RIGHT BREAST LUMPECTOMY WITH RADIOACTIVE SEED AND SENTINEL LYMPH NODE MAPPING;  Surgeon: [name], MD;  Location: [facility];  Service: General;  Laterality: Right;  PEC BLOCK   CHOLECYSTECTOMY     COLONOSCOPY  01/01/2012   Procedure: COLONOSCOPY;  Surgeon: Cristopher Estimable  Rourk, MD;  [facility];  Service: Endoscopy;  Laterality: N/A;  10:00 AM   JOINT REPLACEMENT  2006   left knee   US ECHOCARDIOGRAPHY  08/21/2011   LA mild to mod. dilated,mild MR,TR    I have reviewed the social history and family history with the patient and they are unchanged from previous note.  ALLERGIES:  has No Known Allergies.  MEDICATIONS:  Current Outpatient Medications  Medication Sig Dispense Refill   Cholecalciferol (VITAMIN D) 2000 units tablet Take 2 tablets (4,000 Units total) by mouth daily.     exemestane (AROMASIN) 25 MG tablet Take 1 tablet (25 mg total) by mouth daily  after breakfast. 30 tablet 3   HYDROcodone-acetaminophen (NORCO/VICODIN) 5-325 MG tablet Take 1 tablet by mouth every 6 (six) hours as needed for moderate pain. 15 tablet 0   ibuprofen (ADVIL,MOTRIN) 200 MG tablet Take 800 mg by mouth every 6 (six) hours as needed for moderate pain.      simvastatin (ZOCOR) 20 MG tablet Take 1 tablet (20 mg total) by mouth daily at 6 PM. Please schedule annual appt with Dr. Sallyanne Kuster for refills. [phone]. 2nd attempt. 90 tablet 3   triamterene-hydrochlorothiazide (MAXZIDE-25) 37.5-25 MG tablet Take 1 tablet by mouth daily. 90 tablet 3   No current facility-administered medications for this visit.    PHYSICAL EXAMINATION: ECOG PERFORMANCE STATUS: 1 - Symptomatic but completely ambulatory  Vitals:   08/23/20 1038  BP: 136/85  Pulse: 86  Resp: 18  Temp: 98.1 F (36.7 C)  SpO2: 98%   Filed Weights   08/23/20 1038  Weight: 200 lb 12.8 oz (91.1 kg)    GENERAL:alert, no distress and comfortable SKIN: skin color, texture, turgor are normal, no rashes or significant lesions EYES: normal, Conjunctiva are pink and non-injected, sclera clear  NECK: supple, thyroid normal size, non-tender, without nodularity LYMPH:  no palpable lymphadenopathy in the cervical, axillary  LUNGS: clear to auscultation and percussion with normal breathing effort HEART: regular rate & rhythm and no murmurs and no lower extremity edema ABDOMEN:abdomen soft, non-tender and normal bowel sounds Musculoskeletal:no cyanosis of digits and no clubbing  NEURO: alert & oriented x 3 with fluent speech, no focal motor/sensory deficits BREAST: S/p right lumpectomy: Surgical incision healed well (+) Skin erythema of left breast from RT   LABORATORY DATA:  I have reviewed the data as listed CBC Latest Ref Rng & Units 06/04/2020 04/05/2020 06/11/2017  WBC 4.0 - 10.5 K/uL 8.8 9.2 9.7  Hemoglobin 12.0 - 15.0 g/dL 13.0 13.5 13.0  Hematocrit 36 - 46 % 41.3 40.8 39.5  Platelets 150 - 400  K/uL 221 253 225     CMP Latest Ref Rng & Units 06/04/2020 04/05/2020 02/17/2019  Glucose 70 - 99 mg/dL 95 94 101(H)  BUN 8 - 23 mg/dL $Remove'14 18 20  'kgFnHWE$ Creatinine 0.44 - 1.00 mg/dL 0.86 0.86 0.91  Sodium 135 - 145 mmol/L 139 140 139  Potassium 3.5 - 5.1 mmol/L 3.9 4.5 4.5  Chloride 98 - 111 mmol/L 102 102 103  CO2 22 - 32 mmol/L $RemoveB'27 28 27  'HdqjojRP$ Calcium 8.9 - 10.3 mg/dL 9.9 10.6(H) 10.0  Total Protein 6.5 - 8.1 g/dL 7.0 7.3 7.0  Total Bilirubin 0.3 - 1.2 mg/dL 1.6(H) 1.5(H) 1.3(H)  Alkaline Phos 38 - 126 U/L 61 - -  AST 15 - 41 U/L $Remo'20 19 16  'WCsPw$ ALT 0 - 44 U/L $Remo'21 16 15      'ezhey$ RADIOGRAPHIC STUDIES: I have personally reviewed the  radiological images as listed and agreed with the findings in the report. No results found.   ASSESSMENT & PLAN:  RAPHAEL ESPE is a 69 y.o. female with    1. Invasive ductal carcinoma and DCIS of the central right breast, ER+/PR+/HER2-, Grade 2, pT1cN0M0, Oncotype RS 24 -She was diagnosed in 04/2020 with 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  -She underwent right breast lumpectomy and SLNB with Dr Luisa Hart on 06/08/20. I discussed pathology with patient which showed her 1.1cm high grade mass was completely resected, clear margins and node negative.  -I also discussed her Oncotype which showed RS 2 with distant risk of recurrence at 9 years 10%. There is no benefit of adjuvant chemotherapy, so I did not recommended it.  -To reduce her risk of recurrence, she started adjuvant radiation with Dr Roselind Messier on 07/26/20. Plan to complete on 08/25/20. She is tolerating well with mild skin irritation and fatigue.  -Given the strong ER and PR expression in her postmenopausal status, I recommend adjuvant endocrine therapy with aromatase inhibitor for a total of 5-10 years to reduce the risk of cancer recurrence. Given her significant arthritis, I recommend Exemestane. If she has low bone density or does not tolerate Exemestane well, I would switch to Tamoxifen.   -Potential  benefits and side effects were discussed with patient and she is interested. The potential benefit and side effects, which includes but not limited to hot flash, skin and vaginal dryness, metabolic changes ( increased blood glucose, cholesterol, weight, etc.), slightly in increased risk of cardiovascular disease, cataracts, muscular and joint discomfort, osteopenia and osteoporosis, etc, were discussed with her in great details. She is agreeable. Plan to start in 09/2020.  -We also discussed the breast cancer surveillance after her surgery. She will continue annual screening mammogram, self exams, and a routine office visit with lab and exam with [place].  -She will proceed with Survivorship clinic with NP Lacie in 3 months and f/u with me in 6 months.    2. Genetic Testing negative for pathogenetic mutations.    3. Bone Health  -Her 06/2017 DEXA was normal.  -I will obtain new DEXA as I may start her on AI.  4. Arthritis  -She has chronic arthritis in her knees, hips and back.  -she has had a right knee replacement in the past. She my proceed with left knee replacement soon    PLAN: -Continue RT -DEXA scan in 3-4 weeks  -I called in Exemestane. Plan to start in 09/2020  -Survivorship clinic with NP Lacie in 3 months  -Lab and F/u with me in 6 months    No problem-specific Assessment & Plan notes found for this encounter.   Orders Placed This Encounter  Procedures   DG Bone Density    Standing Status:   Future    Standing Expiration Date:   08/23/2021    Order Specific Question:   Reason for Exam (SYMPTOM  OR DIAGNOSIS REQUIRED)    Answer:   screening    Order Specific Question:   Preferred imaging location?    Answer:   [facility]   All questions were answered. The patient knows to call the clinic with any problems, questions or concerns. No barriers to learning was detected. The total time spent in the appointment was 30 minutes.     Malachy Mood, MD 08/23/2020   I, Delphina Cahill, am acting as scribe for Malachy Mood, MD.   I have reviewed the above  documentation for accuracy and completeness, and I agree with the above.

## 2020-08-23 ENCOUNTER — Inpatient Hospital Stay: Payer: Medicare PPO | Attending: Nurse Practitioner | Admitting: Hematology

## 2020-08-23 ENCOUNTER — Other Ambulatory Visit: Payer: Self-pay

## 2020-08-23 ENCOUNTER — Encounter: Payer: Self-pay | Admitting: Hematology

## 2020-08-23 ENCOUNTER — Ambulatory Visit
Admission: RE | Admit: 2020-08-23 | Discharge: 2020-08-23 | Disposition: A | Discharge status: Home / Self Care | Payer: Medicare PPO | Source: Ambulatory Visit | Attending: Radiation Oncology | Admitting: Radiation Oncology

## 2020-08-23 VITALS — BP 136/85 | HR 86 | Temp 98.1°F | Resp 18 | Ht 63.0 in | Wt 200.8 lb

## 2020-08-23 DIAGNOSIS — Z923 Personal history of irradiation: Secondary | ICD-10-CM | POA: Insufficient documentation

## 2020-08-23 DIAGNOSIS — C50911 Malignant neoplasm of unspecified site of right female breast: Secondary | ICD-10-CM | POA: Diagnosis not present

## 2020-08-23 DIAGNOSIS — Z170 Estrogen receptor positive status [ER+]: Secondary | ICD-10-CM | POA: Insufficient documentation

## 2020-08-23 DIAGNOSIS — E559 Vitamin D deficiency, unspecified: Secondary | ICD-10-CM | POA: Diagnosis not present

## 2020-08-23 DIAGNOSIS — Z808 Family history of malignant neoplasm of other organs or systems: Secondary | ICD-10-CM | POA: Insufficient documentation

## 2020-08-23 DIAGNOSIS — Z79811 Long term (current) use of aromatase inhibitors: Secondary | ICD-10-CM | POA: Diagnosis not present

## 2020-08-23 DIAGNOSIS — I10 Essential (primary) hypertension: Secondary | ICD-10-CM | POA: Diagnosis not present

## 2020-08-23 DIAGNOSIS — C50311 Malignant neoplasm of lower-inner quadrant of right female breast: Secondary | ICD-10-CM | POA: Diagnosis not present

## 2020-08-23 DIAGNOSIS — Z807 Family history of other malignant neoplasms of lymphoid, hematopoietic and related tissues: Secondary | ICD-10-CM | POA: Insufficient documentation

## 2020-08-23 DIAGNOSIS — Z801 Family history of malignant neoplasm of trachea, bronchus and lung: Secondary | ICD-10-CM | POA: Diagnosis not present

## 2020-08-23 DIAGNOSIS — Z510 Encounter for antineoplastic radiation therapy: Secondary | ICD-10-CM | POA: Diagnosis not present

## 2020-08-23 DIAGNOSIS — E2839 Other primary ovarian failure: Secondary | ICD-10-CM

## 2020-08-23 DIAGNOSIS — Z8041 Family history of malignant neoplasm of ovary: Secondary | ICD-10-CM | POA: Insufficient documentation

## 2020-08-23 DIAGNOSIS — Z800 Family history of malignant neoplasm of digestive organs: Secondary | ICD-10-CM | POA: Diagnosis not present

## 2020-08-23 DIAGNOSIS — Z79899 Other long term (current) drug therapy: Secondary | ICD-10-CM | POA: Diagnosis not present

## 2020-08-23 DIAGNOSIS — Z8049 Family history of malignant neoplasm of other genital organs: Secondary | ICD-10-CM | POA: Diagnosis not present

## 2020-08-23 DIAGNOSIS — Z806 Family history of leukemia: Secondary | ICD-10-CM | POA: Insufficient documentation

## 2020-08-23 DIAGNOSIS — C50111 Malignant neoplasm of central portion of right female breast: Secondary | ICD-10-CM | POA: Diagnosis not present

## 2020-08-23 MED ORDER — EXEMESTANE 25 MG PO TABS
25.0000 mg | ORAL_TABLET | Freq: Every day | ORAL | 3 refills | Status: DC
Start: 2020-08-23 — End: 2020-11-22

## 2020-08-24 ENCOUNTER — Telehealth: Payer: Self-pay | Admitting: Hematology

## 2020-08-24 ENCOUNTER — Ambulatory Visit
Admission: RE | Admit: 2020-08-24 | Discharge: 2020-08-24 | Disposition: A | Discharge status: Home / Self Care | Payer: Medicare PPO | Source: Ambulatory Visit | Attending: Radiation Oncology | Admitting: Radiation Oncology

## 2020-08-24 ENCOUNTER — Ambulatory Visit: Payer: Medicare PPO

## 2020-08-24 ENCOUNTER — Other Ambulatory Visit: Payer: Self-pay

## 2020-08-24 DIAGNOSIS — Z510 Encounter for antineoplastic radiation therapy: Secondary | ICD-10-CM | POA: Diagnosis not present

## 2020-08-24 DIAGNOSIS — C50311 Malignant neoplasm of lower-inner quadrant of right female breast: Secondary | ICD-10-CM | POA: Diagnosis not present

## 2020-08-24 NOTE — Telephone Encounter (Signed)
Scheduled per 9/13 los. Pt is aware of appt times and dates.

## 2020-08-25 ENCOUNTER — Encounter: Payer: Self-pay | Admitting: *Deleted

## 2020-08-25 ENCOUNTER — Encounter: Payer: Self-pay | Admitting: Radiation Oncology

## 2020-08-25 ENCOUNTER — Ambulatory Visit
Admission: RE | Admit: 2020-08-25 | Discharge: 2020-08-25 | Disposition: A | Discharge status: Home / Self Care | Payer: Medicare PPO | Source: Ambulatory Visit | Attending: Radiation Oncology | Admitting: Radiation Oncology

## 2020-08-25 ENCOUNTER — Other Ambulatory Visit: Payer: Self-pay

## 2020-08-25 DIAGNOSIS — Z510 Encounter for antineoplastic radiation therapy: Secondary | ICD-10-CM | POA: Diagnosis not present

## 2020-08-25 DIAGNOSIS — C50311 Malignant neoplasm of lower-inner quadrant of right female breast: Secondary | ICD-10-CM | POA: Diagnosis not present

## 2020-09-07 ENCOUNTER — Ambulatory Visit
Admission: RE | Admit: 2020-09-07 | Discharge: 2020-09-07 | Disposition: A | Discharge status: Home / Self Care | Payer: Medicare PPO | Source: Ambulatory Visit | Attending: Hematology | Admitting: Hematology

## 2020-09-07 ENCOUNTER — Other Ambulatory Visit: Payer: Self-pay

## 2020-09-07 DIAGNOSIS — E2839 Other primary ovarian failure: Secondary | ICD-10-CM

## 2020-09-07 DIAGNOSIS — Z780 Asymptomatic menopausal state: Secondary | ICD-10-CM | POA: Diagnosis not present

## 2020-09-08 ENCOUNTER — Encounter: Payer: Self-pay | Admitting: Hematology

## 2020-09-09 ENCOUNTER — Telehealth: Payer: Self-pay | Admitting: *Deleted

## 2020-09-09 ENCOUNTER — Other Ambulatory Visit: Payer: Self-pay

## 2020-09-09 ENCOUNTER — Encounter: Payer: Self-pay | Admitting: *Deleted

## 2020-09-09 ENCOUNTER — Ambulatory Visit (INDEPENDENT_AMBULATORY_CARE_PROVIDER_SITE_OTHER): Payer: Medicare PPO | Admitting: Nurse Practitioner

## 2020-09-09 ENCOUNTER — Encounter: Payer: Self-pay | Admitting: Nurse Practitioner

## 2020-09-09 VITALS — BP 148/89 | HR 105 | Temp 97.5°F | Ht 63.0 in | Wt 202.4 lb

## 2020-09-09 DIAGNOSIS — Z800 Family history of malignant neoplasm of digestive organs: Secondary | ICD-10-CM

## 2020-09-09 NOTE — Progress Notes (Signed)
Referring Provider: [name], MD Primary Care Physician:  [name], MD Primary GI:  Dr. Gala Romney  Chief Complaint  Patient presents with  . Consult    discuss TCS    HPI:   [name] is a 69 y.o. female who presents on referral from primary care to discuss/schedule colonoscopy.  Reviewed information provided with referral including office visit dated 07/12/2020.  Noted history of breast cancer and biopsy-proven lymph nodes to be negative status post resection and radiation with excellent prognosis.  She is now wanting to proceed with further cancer screening due to anxiety and peace of mind.  She requested a Pap smear from her primary care and is also requesting referral to GI for a colonoscopy.  She did have a colonoscopy in 2013, per primary care.  She understands she is not technically due until 2023 but would like to discuss regardless.  Last colonoscopy completed 01/01/2012 which found colonic diverticulosis otherwise normal.  Recommended repeat colonoscopy in 10 years.  Today she states she doing okay overall. Upon reviewing her PMH and family history, there are multiple occurrences of various cancers in her family, including colon cancer in her mother (at age 69). Patient states it was diagnosed in August 22, 2018, she passed away last year. Denies abdominal pain, N/V, hematochezia, melena, fever, chills, unintentional weight loss. Denies URI or flu-like symptoms. Denies loss of sense of taste or smell. The patient has not received COVID-19 vaccination(s). They are not interested in vaccine scheduling information. Denies chest pain, dyspnea, dizziness, lightheadedness, syncope, near syncope. Denies any other upper or lower GI symptoms.  Past Medical History:  Diagnosis Date  . Arthritis   . Family history of bone cancer   . Family history of colon cancer   . Family history of leukemia   . Family history of lung cancer   . Family history of melanoma   .  Family history of ovarian cancer   . Family history of stomach cancer   . Family history of uterine cancer   . History of kidney stones   . Hyperlipemia   . Hypertension   . Invasive ductal carcinoma of breast ([place])   . Kidney stones before 31-Dec-2011   states she has passed stones 4 times/ tsf  . Lower extremity edema   . Obesity     Past Surgical History:  Procedure Laterality Date  . BREAST BIOPSY Left   . BREAST LUMPECTOMY WITH RADIOACTIVE SEED AND SENTINEL LYMPH NODE BIOPSY Right 06/08/2020   Procedure: RIGHT BREAST LUMPECTOMY WITH RADIOACTIVE SEED AND SENTINEL LYMPH NODE MAPPING;  Surgeon: [name], MD;  Location: [facility];  Service: General;  Laterality: Right;  PEC BLOCK  . CHOLECYSTECTOMY    . COLONOSCOPY  01/01/2012   Procedure: COLONOSCOPY;  Surgeon: Daneil Dolin, MD;  [facility];  Service: Endoscopy;  Laterality: N/A;  10:00 AM  . JOINT REPLACEMENT  2006   left knee  . US ECHOCARDIOGRAPHY  08/21/2011   LA mild to mod. dilated,mild MR,TR    Current Outpatient Medications  Medication Sig Dispense Refill  . Cholecalciferol (VITAMIN D) 2000 units tablet Take 2 tablets (4,000 Units total) by mouth daily.    Marland Kitchen exemestane (AROMASIN) 25 MG tablet Take 1 tablet (25 mg total) by mouth daily after breakfast. 30 tablet 3  . HYDROcodone-acetaminophen (NORCO/VICODIN) 5-325 MG tablet Take 1 tablet by mouth every 6 (six) hours as needed for moderate pain. 15 tablet 0  .  ibuprofen (ADVIL,MOTRIN) 200 MG tablet Take 800 mg by mouth every 6 (six) hours as needed for moderate pain.     . simvastatin (ZOCOR) 20 MG tablet Take 1 tablet (20 mg total) by mouth daily at 6 PM. Please schedule annual appt with Dr. Sallyanne Kuster for refills. [phone]. 2nd attempt. 90 tablet 3  . triamterene-hydrochlorothiazide (MAXZIDE-25) 37.5-25 MG tablet Take 1 tablet by mouth daily. 90 tablet 3   No current facility-administered medications for this visit.    Allergies as of 09/09/2020  . (No  Known Allergies)    Family History  Problem Relation Age of Onset  . Hypertension Mother   . Colon cancer Mother 2  . Hypertension Father   . Heart attack Father   . Melanoma Father        dx. in his early 54s (x3)  . Uterine cancer Maternal Aunt        dx. >50  . Cancer Daughter 3       ovarian (dysgerminoma)  . Leukemia Paternal Uncle        dx. in his late 10s  . Stomach cancer Maternal Grandmother        dx. in her 50s  . Bone cancer Maternal Aunt        dx. in her early 65s  . Lung cancer Maternal Aunt   . Testicular cancer Cousin        dx. in his late 30s/early 61s (maternal cousin)  . Anesthesia problems Neg Hx     Social History   Socioeconomic History  . Marital status: Married    Spouse name: Not on file  . Number of children: 2  . Years of education: Not on file  . Highest education level: Not on file  Occupational History  . Not on file  Tobacco Use  . Smoking status: Never Smoker  . Smokeless tobacco: Never Used  Vaping Use  . Vaping Use: Never used  Substance and Sexual Activity  . Alcohol use: No  . Drug use: No  . Sexual activity: Not on file  Other Topics Concern  . Not on file  Social History Narrative  . Not on file   Social Determinants of Health   Financial Resource Strain:   . Difficulty of Paying Living Expenses: Not on file  Food Insecurity:   . Worried About Charity fundraiser in the Last Year: Not on file  . Ran Out of Food in the Last Year: Not on file  Transportation Needs:   . Lack of Transportation (Medical): Not on file  . Lack of Transportation (Non-Medical): Not on file  Physical Activity:   . Days of Exercise per Week: Not on file  . Minutes of Exercise per Session: Not on file  Stress:   . Feeling of Stress : Not on file  Social Connections:   . Frequency of Communication with Friends and Family: Not on file  . Frequency of Social Gatherings with Friends and Family: Not on file  . Attends Religious Services:  Not on file  . Active Member of Clubs or Organizations: Not on file  . Attends Archivist Meetings: Not on file  . Marital Status: Not on file    Subjective: Review of Systems  Constitutional: Negative for chills, fever, malaise/fatigue and weight loss.  HENT: Negative for congestion and sore throat.   Respiratory: Negative for cough and shortness of breath.   Cardiovascular: Negative for chest pain and palpitations.  Gastrointestinal: Negative for  abdominal pain, blood in stool, diarrhea, melena, nausea and vomiting.  Musculoskeletal: Negative for joint pain and myalgias.  Skin: Negative for rash.  Neurological: Negative for dizziness and weakness.  Endo/Heme/Allergies: Does not bruise/bleed easily.  Psychiatric/Behavioral: Negative for depression. The patient is not nervous/anxious.   All other systems reviewed and are negative.    Objective: BP (!) 148/89   Pulse (!) 105   Temp (!) 97.5 F (36.4 C) (Oral)   Ht 5\' 3"  (1.6 m)   Wt 202 lb 6.4 oz (91.8 kg)   BMI 35.85 kg/m  Physical Exam Vitals and nursing note reviewed.  Constitutional:      General: She is not in acute distress.    Appearance: Normal appearance. She is well-developed. She is obese. She is not ill-appearing, toxic-appearing or diaphoretic.  HENT:     Head: Normocephalic and atraumatic.     Nose: No congestion or rhinorrhea.  Eyes:     General: No scleral icterus. Cardiovascular:     Rate and Rhythm: Normal rate and regular rhythm.     Heart sounds: Normal heart sounds.  Pulmonary:     Effort: Pulmonary effort is normal. No respiratory distress.     Breath sounds: Normal breath sounds.  Abdominal:     General: Bowel sounds are normal.     Palpations: Abdomen is soft. There is no hepatomegaly, splenomegaly or mass.     Tenderness: There is no abdominal tenderness. There is no guarding or rebound.     Hernia: No hernia is present.  Skin:    General: Skin is warm and dry.     Coloration:  Skin is not jaundiced.     Findings: No rash.  Neurological:     General: No focal deficit present.     Mental Status: She is alert and oriented to person, place, and time.  Psychiatric:        Attention and Perception: Attention normal.        Mood and Affect: Mood normal.        Speech: Speech normal.        Behavior: Behavior normal.        Thought Content: Thought content normal.        Cognition and Memory: Cognition and memory normal.      Assessment:  Very pleasant 69 year old female who recently was diagnosed with breast cancer without spread to lymph nodes or distally.  She underwent lumpectomy, lymph node biopsy normal.  She completed radiation and has an excellent prognosis.  She is since decided to undergo other cancer screenings to ensure she is up-to-date.  Her last colonoscopy was less than 10 years ago with a recommended 10-year repeat.  However, since that time her mother was diagnosed with colon cancer (approximately 2 years ago) and unfortunately passed away last year.  This does make her high risk and I feel it is appropriate to adjust her screening interval to 5 years based on this.  We will proceed with scheduling a repeat colonoscopy at this time.  Proceed with colonoscopy by Dr. Gala Romney in near future: the risks, benefits, and alternatives have been discussed with the patient in detail. The patient states understanding and desires to proceed.  The patient is currently on hydrocodone. The patient is not on any other anticoagulants, anxiolytics, chronic pain medications, antidepressants, antidiabetics, or iron supplements.  ASA II   Plan: 1. Colonoscopy for high risk screening 2. Follow-up as needed    Thank you for allowing [place] to  participate in the care of [name]  Shannon Field, DNP, AGNP-C Adult & Gerontological Nurse Practitioner [facility]   09/09/2020 2:47 PM   Disclaimer: This note was dictated with voice  recognition software. Similar sounding words can inadvertently be transcribed and may not be corrected upon review.

## 2020-09-09 NOTE — H&P (View-Only) (Signed)
Referring Provider: [name], MD Primary Care Physician:  [name], MD Primary GI:  Dr. Gala Romney  Chief Complaint  Patient presents with  . Consult    discuss TCS    HPI:   [name] is a 69 y.o. female who presents on referral from primary care to discuss/schedule colonoscopy.  Reviewed information provided with referral including office visit dated 07/12/2020.  Noted history of breast cancer and biopsy-proven lymph nodes to be negative status post resection and radiation with excellent prognosis.  She is now wanting to proceed with further cancer screening due to anxiety and peace of mind.  She requested a Pap smear from her primary care and is also requesting referral to GI for a colonoscopy.  She did have a colonoscopy in 2013, per primary care.  She understands she is not technically due until 2023 but would like to discuss regardless.  Last colonoscopy completed 01/01/2012 which found colonic diverticulosis otherwise normal.  Recommended repeat colonoscopy in 10 years.  Today she states she doing okay overall. Upon reviewing her PMH and family history, there are multiple occurrences of various cancers in her family, including colon cancer in her mother (at age 59). Patient states it was diagnosed in 2018-08-03, she passed away last year. Denies abdominal pain, N/V, hematochezia, melena, fever, chills, unintentional weight loss. Denies URI or flu-like symptoms. Denies loss of sense of taste or smell. The patient has not received COVID-19 vaccination(s). They are not interested in vaccine scheduling information. Denies chest pain, dyspnea, dizziness, lightheadedness, syncope, near syncope. Denies any other upper or lower GI symptoms.  Past Medical History:  Diagnosis Date  . Arthritis   . Family history of bone cancer   . Family history of colon cancer   . Family history of leukemia   . Family history of lung cancer   . Family history of melanoma   .  Family history of ovarian cancer   . Family history of stomach cancer   . Family history of uterine cancer   . History of kidney stones   . Hyperlipemia   . Hypertension   . Invasive ductal carcinoma of breast ([place])   . Kidney stones before 2011-12-12   states she has passed stones 4 times/ tsf  . Lower extremity edema   . Obesity     Past Surgical History:  Procedure Laterality Date  . BREAST BIOPSY Left   . BREAST LUMPECTOMY WITH RADIOACTIVE SEED AND SENTINEL LYMPH NODE BIOPSY Right 06/08/2020   Procedure: RIGHT BREAST LUMPECTOMY WITH RADIOACTIVE SEED AND SENTINEL LYMPH NODE MAPPING;  Surgeon: [name], MD;  Location: [facility];  Service: General;  Laterality: Right;  PEC BLOCK  . CHOLECYSTECTOMY    . COLONOSCOPY  01/01/2012   Procedure: COLONOSCOPY;  Surgeon: Daneil Dolin, MD;  [facility];  Service: Endoscopy;  Laterality: N/A;  10:00 AM  . JOINT REPLACEMENT  2006   left knee  . US ECHOCARDIOGRAPHY  08/21/2011   LA mild to mod. dilated,mild MR,TR    Current Outpatient Medications  Medication Sig Dispense Refill  . Cholecalciferol (VITAMIN D) 2000 units tablet Take 2 tablets (4,000 Units total) by mouth daily.    Marland Kitchen exemestane (AROMASIN) 25 MG tablet Take 1 tablet (25 mg total) by mouth daily after breakfast. 30 tablet 3  . HYDROcodone-acetaminophen (NORCO/VICODIN) 5-325 MG tablet Take 1 tablet by mouth every 6 (six) hours as needed for moderate pain. 15 tablet 0  .  ibuprofen (ADVIL,MOTRIN) 200 MG tablet Take 800 mg by mouth every 6 (six) hours as needed for moderate pain.     . simvastatin (ZOCOR) 20 MG tablet Take 1 tablet (20 mg total) by mouth daily at 6 PM. Please schedule annual appt with Dr. Sallyanne Kuster for refills. [phone]. 2nd attempt. 90 tablet 3  . triamterene-hydrochlorothiazide (MAXZIDE-25) 37.5-25 MG tablet Take 1 tablet by mouth daily. 90 tablet 3   No current facility-administered medications for this visit.    Allergies as of 09/09/2020  . (No  Known Allergies)    Family History  Problem Relation Age of Onset  . Hypertension Mother   . Colon cancer Mother 2  . Hypertension Father   . Heart attack Father   . Melanoma Father        dx. in his early 65s (x3)  . Uterine cancer Maternal Aunt        dx. >50  . Cancer Daughter 79       ovarian (dysgerminoma)  . Leukemia Paternal Uncle        dx. in his late 63s  . Stomach cancer Maternal Grandmother        dx. in her 67s  . Bone cancer Maternal Aunt        dx. in her early 28s  . Lung cancer Maternal Aunt   . Testicular cancer Cousin        dx. in his late 30s/early 82s (maternal cousin)  . Anesthesia problems Neg Hx     Social History   Socioeconomic History  . Marital status: Married    Spouse name: Not on file  . Number of children: 2  . Years of education: Not on file  . Highest education level: Not on file  Occupational History  . Not on file  Tobacco Use  . Smoking status: Never Smoker  . Smokeless tobacco: Never Used  Vaping Use  . Vaping Use: Never used  Substance and Sexual Activity  . Alcohol use: No  . Drug use: No  . Sexual activity: Not on file  Other Topics Concern  . Not on file  Social History Narrative  . Not on file   Social Determinants of Health   Financial Resource Strain:   . Difficulty of Paying Living Expenses: Not on file  Food Insecurity:   . Worried About Charity fundraiser in the Last Year: Not on file  . Ran Out of Food in the Last Year: Not on file  Transportation Needs:   . Lack of Transportation (Medical): Not on file  . Lack of Transportation (Non-Medical): Not on file  Physical Activity:   . Days of Exercise per Week: Not on file  . Minutes of Exercise per Session: Not on file  Stress:   . Feeling of Stress : Not on file  Social Connections:   . Frequency of Communication with Friends and Family: Not on file  . Frequency of Social Gatherings with Friends and Family: Not on file  . Attends Religious Services:  Not on file  . Active Member of Clubs or Organizations: Not on file  . Attends Archivist Meetings: Not on file  . Marital Status: Not on file    Subjective: Review of Systems  Constitutional: Negative for chills, fever, malaise/fatigue and weight loss.  HENT: Negative for congestion and sore throat.   Respiratory: Negative for cough and shortness of breath.   Cardiovascular: Negative for chest pain and palpitations.  Gastrointestinal: Negative for  abdominal pain, blood in stool, diarrhea, melena, nausea and vomiting.  Musculoskeletal: Negative for joint pain and myalgias.  Skin: Negative for rash.  Neurological: Negative for dizziness and weakness.  Endo/Heme/Allergies: Does not bruise/bleed easily.  Psychiatric/Behavioral: Negative for depression. The patient is not nervous/anxious.   All other systems reviewed and are negative.    Objective: BP (!) 148/89   Pulse (!) 105   Temp (!) 97.5 F (36.4 C) (Oral)   Ht 5\' 3"  (1.6 m)   Wt 202 lb 6.4 oz (91.8 kg)   BMI 35.85 kg/m  Physical Exam Vitals and nursing note reviewed.  Constitutional:      General: She is not in acute distress.    Appearance: Normal appearance. She is well-developed. She is obese. She is not ill-appearing, toxic-appearing or diaphoretic.  HENT:     Head: Normocephalic and atraumatic.     Nose: No congestion or rhinorrhea.  Eyes:     General: No scleral icterus. Cardiovascular:     Rate and Rhythm: Normal rate and regular rhythm.     Heart sounds: Normal heart sounds.  Pulmonary:     Effort: Pulmonary effort is normal. No respiratory distress.     Breath sounds: Normal breath sounds.  Abdominal:     General: Bowel sounds are normal.     Palpations: Abdomen is soft. There is no hepatomegaly, splenomegaly or mass.     Tenderness: There is no abdominal tenderness. There is no guarding or rebound.     Hernia: No hernia is present.  Skin:    General: Skin is warm and dry.     Coloration:  Skin is not jaundiced.     Findings: No rash.  Neurological:     General: No focal deficit present.     Mental Status: She is alert and oriented to person, place, and time.  Psychiatric:        Attention and Perception: Attention normal.        Mood and Affect: Mood normal.        Speech: Speech normal.        Behavior: Behavior normal.        Thought Content: Thought content normal.        Cognition and Memory: Cognition and memory normal.      Assessment:  Very pleasant 69 year old female who recently was diagnosed with breast cancer without spread to lymph nodes or distally.  She underwent lumpectomy, lymph node biopsy normal.  She completed radiation and has an excellent prognosis.  She is since decided to undergo other cancer screenings to ensure she is up-to-date.  Her last colonoscopy was less than 10 years ago with a recommended 10-year repeat.  However, since that time her mother was diagnosed with colon cancer (approximately 2 years ago) and unfortunately passed away last year.  This does make her high risk and I feel it is appropriate to adjust her screening interval to 5 years based on this.  We will proceed with scheduling a repeat colonoscopy at this time.  Proceed with colonoscopy by Dr. Gala Romney in near future: the risks, benefits, and alternatives have been discussed with the patient in detail. The patient states understanding and desires to proceed.  The patient is currently on hydrocodone. The patient is not on any other anticoagulants, anxiolytics, chronic pain medications, antidepressants, antidiabetics, or iron supplements.  ASA II   Plan: 1. Colonoscopy for high risk screening 2. Follow-up as needed    Thank you for allowing [place] to  participate in the care of [facility]  Walden Field, DNP, AGNP-C Adult & Gerontological Nurse Practitioner [facility]   09/09/2020 2:47 PM   Disclaimer: This note was dictated with voice  recognition software. Similar sounding words can inadvertently be transcribed and may not be corrected upon review.

## 2020-09-09 NOTE — Telephone Encounter (Signed)
PA approved via [employer] for TCS. Auth# [id number] dates 10/07/2020-11/06/2020

## 2020-09-09 NOTE — Patient Instructions (Signed)
Your health issues we discussed today were:   Family history of colon cancer: 1. Because your mother was diagnosed with colon cancer couple years ago, you now have a primary relative with history of colon cancer.  This makes you high risk for colon cancer and we will adjust her screening interval appropriately 2. You will likely never go longer than 5 years in between colonoscopies 3. We will schedule your colonoscopy at this time 4. Further recommendations will follow  Overall I recommend:  1. Continue your other current medications 2. Return for follow-up as needed 3. Call us if you have any questions or concerns   ---------------------------------------------------------------  I am glad you have gotten your COVID-19 vaccination!  Even though you are fully vaccinated you should continue to follow CDC and state/local guidelines.  ---------------------------------------------------------------   At [facility] we value your feedback. You may receive a survey about your visit today. Please share your experience as we strive to create trusting relationships with our patients to provide genuine, compassionate, quality care.  We appreciate your understanding and patience as we review any laboratory studies, imaging, and other diagnostic tests that are ordered as we care for you. Our office policy is 5 business days for review of these results, and any emergent or urgent results are addressed in a timely manner for your best interest. If you do not hear from our office in 1 week, please contact us.   We also encourage the use of MyChart, which contains your medical information for your review as well. If you are not enrolled in this feature, an access code is on this after visit summary for your convenience. Thank you for allowing [place] to be involved in your care.  It was great to see you today!  I hope you have a great Fall!!

## 2020-09-15 NOTE — Progress Notes (Incomplete)
  Patient Name: [name] MRN: [id number] DOB: 01-08-1951 Referring Physician: [name] (Profile Not Attached) Date of Service: 08/25/2020 [facility], [place]                                                        End Of Treatment Note  Diagnoses: C50.911-Malignant neoplasm of unspecified site of right female breast  Cancer Staging: StageIA(pT1c, pN0)RightBreastLIQ,Invasive DuctalCarcinomawith high-grade DCIS, ER+/ PR+/ Her2-, Grade2  Intent: Curative  Radiation Treatment Dates: 07/26/2020 through 08/25/2020 Site Technique Total Dose (Gy) Dose per Fx (Gy) Completed Fx Beam Energies  Breast, Right: Breast_Rt 3D 40.05/40.05 2.67 15/15 6X, 10X  Breast, Right: Breast_Rt_Bst specialPort 12/12 2 6/6 15E   Narrative: The patient tolerated radiation therapy relatively well. She did report some mild fatigue and intermittent, sharp, shooting right breast pains. She denied difficulty with range of motion of right arm. During the beginning of treatment, she had some mild erythema centrally within the breast that had been there since surgery. As treatment continued, she developed some hyperpigmentation changes to the right breast area, but no signs of infection or skin breakdown. At the end of treatment, she was noted to have erythema in the inframammary fold area, but no moist desquamation.  Plan: The patient will follow-up with radiation oncology in one month.  ________________________________________________   Blair Promise, PhD, MD  This document serves as a record of services personally performed by [name], MD. It was created on his behalf by Clerance Lav, a trained medical scribe. The creation of this record is based on the scribe's personal observations and the provider's statements to them. This  document has been checked and approved by the attending provider.

## 2020-10-06 ENCOUNTER — Other Ambulatory Visit: Payer: Self-pay

## 2020-10-06 ENCOUNTER — Other Ambulatory Visit (HOSPITAL_COMMUNITY)
Admission: RE | Admit: 2020-10-06 | Discharge: 2020-10-06 | Disposition: A | Discharge status: Home / Self Care | Payer: Medicare PPO | Source: Ambulatory Visit | Attending: Internal Medicine | Admitting: Internal Medicine

## 2020-10-06 DIAGNOSIS — Z01812 Encounter for preprocedural laboratory examination: Secondary | ICD-10-CM | POA: Insufficient documentation

## 2020-10-06 DIAGNOSIS — Z20822 Contact with and (suspected) exposure to covid-19: Secondary | ICD-10-CM | POA: Insufficient documentation

## 2020-10-06 LAB — BASIC METABOLIC PANEL
Anion gap: 10 (ref 5–15)
BUN: 17 mg/dL (ref 8–23)
CO2: 26 mmol/L (ref 22–32)
Calcium: 9.8 mg/dL (ref 8.9–10.3)
Chloride: 101 mmol/L (ref 98–111)
Creatinine, Ser: 0.79 mg/dL (ref 0.44–1.00)
GFR, Estimated: >60 mL/min (ref >60)
Glucose, Bld: 101 mg/dL — ABNORMAL HIGH (ref 70–99)
Potassium: 4.1 mmol/L (ref 3.5–5.1)
Sodium: 137 mmol/L (ref 135–145)

## 2020-10-06 LAB — SARS CORONAVIRUS 2 (TAT 6-24 HRS): SARS Coronavirus 2: NEGATIVE

## 2020-10-07 ENCOUNTER — Other Ambulatory Visit: Payer: Self-pay

## 2020-10-07 ENCOUNTER — Encounter (HOSPITAL_COMMUNITY)
Admission: RE | Disposition: A | Discharge status: Home / Self Care | Payer: Self-pay | Source: Home / Self Care | Attending: Internal Medicine

## 2020-10-07 ENCOUNTER — Ambulatory Visit (HOSPITAL_COMMUNITY)
Admission: RE | Admit: 2020-10-07 | Discharge: 2020-10-07 | Disposition: A | Discharge status: Home / Self Care | Payer: Medicare PPO | Attending: Internal Medicine | Admitting: Internal Medicine

## 2020-10-07 ENCOUNTER — Ambulatory Visit (HOSPITAL_COMMUNITY): Payer: Medicare PPO | Admitting: Anesthesiology

## 2020-10-07 ENCOUNTER — Encounter (HOSPITAL_COMMUNITY): Payer: Self-pay | Admitting: Internal Medicine

## 2020-10-07 DIAGNOSIS — K5730 Diverticulosis of large intestine without perforation or abscess without bleeding: Secondary | ICD-10-CM | POA: Diagnosis not present

## 2020-10-07 DIAGNOSIS — Z801 Family history of malignant neoplasm of trachea, bronchus and lung: Secondary | ICD-10-CM | POA: Insufficient documentation

## 2020-10-07 DIAGNOSIS — Z1211 Encounter for screening for malignant neoplasm of colon: Secondary | ICD-10-CM | POA: Diagnosis not present

## 2020-10-07 DIAGNOSIS — Z853 Personal history of malignant neoplasm of breast: Secondary | ICD-10-CM | POA: Insufficient documentation

## 2020-10-07 DIAGNOSIS — K635 Polyp of colon: Secondary | ICD-10-CM | POA: Diagnosis not present

## 2020-10-07 DIAGNOSIS — Z800 Family history of malignant neoplasm of digestive organs: Secondary | ICD-10-CM | POA: Insufficient documentation

## 2020-10-07 DIAGNOSIS — Z808 Family history of malignant neoplasm of other organs or systems: Secondary | ICD-10-CM | POA: Insufficient documentation

## 2020-10-07 DIAGNOSIS — D120 Benign neoplasm of cecum: Secondary | ICD-10-CM | POA: Insufficient documentation

## 2020-10-07 HISTORY — PX: COLONOSCOPY WITH PROPOFOL: SHX5780

## 2020-10-07 HISTORY — PX: POLYPECTOMY: SHX5525

## 2020-10-07 SURGERY — COLONOSCOPY WITH PROPOFOL
Anesthesia: General

## 2020-10-07 MED ORDER — HYDROMORPHONE HCL 1 MG/ML IJ SOLN: 0.2500 mg | INTRAMUSCULAR | Status: DC | PRN

## 2020-10-07 MED ORDER — DROPERIDOL 2.5 MG/ML IJ SOLN: 0.6250 mg | Freq: Once | INTRAMUSCULAR | Status: DC | PRN

## 2020-10-07 MED ORDER — MEPERIDINE HCL 50 MG/ML IJ SOLN: 6.2500 mg | INTRAMUSCULAR | Status: DC | PRN

## 2020-10-07 MED ORDER — PROPOFOL 10 MG/ML IV BOLUS
INTRAVENOUS | Status: DC | PRN
  Administered 2020-10-07: 20 mg via INTRAVENOUS
  Administered 2020-10-07: 80 mg via INTRAVENOUS

## 2020-10-07 MED ORDER — LIDOCAINE HCL (CARDIAC) PF 100 MG/5ML IV SOSY
PREFILLED_SYRINGE | INTRAVENOUS | Status: DC | PRN
  Administered 2020-10-07: 80 mg via INTRATRACHEAL

## 2020-10-07 MED ORDER — OXYCODONE HCL 5 MG PO TABS: 5.0000 mg | ORAL_TABLET | Freq: Once | ORAL | Status: DC | PRN

## 2020-10-07 MED ORDER — PROPOFOL 500 MG/50ML IV EMUL
INTRAVENOUS | Status: DC | PRN
  Administered 2020-10-07: 125 ug/kg/min via INTRAVENOUS
  Administered 2020-10-07: 150 ug/kg/min via INTRAVENOUS

## 2020-10-07 MED ORDER — CHLORHEXIDINE GLUCONATE CLOTH 2 % EX PADS: 6.0000 | MEDICATED_PAD | Freq: Once | CUTANEOUS | Status: DC

## 2020-10-07 MED ORDER — ACETAMINOPHEN 10 MG/ML IV SOLN: 1000.0000 mg | Freq: Once | INTRAVENOUS | Status: DC | PRN

## 2020-10-07 MED ORDER — STERILE WATER FOR IRRIGATION IR SOLN
Status: DC | PRN
  Administered 2020-10-07: 100 mL

## 2020-10-07 MED ORDER — ONDANSETRON HCL 4 MG/2ML IJ SOLN: 4.0000 mg | Freq: Once | INTRAMUSCULAR | Status: DC | PRN

## 2020-10-07 MED ORDER — LACTATED RINGERS IV SOLN: INTRAVENOUS | Status: DC

## 2020-10-07 MED ORDER — OXYCODONE HCL 5 MG/5ML PO SOLN: 5.0000 mg | Freq: Once | ORAL | Status: DC | PRN

## 2020-10-07 NOTE — Op Note (Signed)
[facility] Patient Name: [name] Procedure Date: 10/07/2020 9:15 AM MRN: [id number] Date of Birth: 1950/12/21 Attending MD: Norvel Richards , MD CSN: [id number] Age: 69 Admit Type: Outpatient Procedure:                Colonoscopy Indications:              Screening for colorectal malignant neoplasm Providers:                Norvel Richards, MD, Lambert Mody,                            Crystal Page, [place] Risa Grill, Technician,                            Nelma Rothman, Merchant navy officer Referring MD:             Cammie Mcgee. [place] Medicines:                Propofol per Anesthesia Complications:            No immediate complications. Estimated Blood Loss:     Estimated blood loss was minimal. Procedure:                Pre-Anesthesia Assessment:                           - Prior to the procedure, a History and Physical                            was performed, and patient medications and                            allergies were reviewed. The patient's tolerance of                            previous anesthesia was also reviewed. The risks                            and benefits of the procedure and the sedation                            options and risks were discussed with the patient.                            All questions were answered, and informed consent                            was obtained. ASA Grade Assessment: II - A patient                            with mild systemic disease. After reviewing the                            risks and benefits, the patient was deemed in  satisfactory condition to undergo the procedure.                           After obtaining informed consent, the colonoscope                            was passed under direct vision. Throughout the                            procedure, the patient's blood pressure, pulse, and                            oxygen saturations were monitored continuously. The                             [id number]-[id number] ([id number]) scope was introduced through                            the anus and advanced to the the cecum, identified                            by appendiceal orifice and ileocecal valve. The                            colonoscopy was performed without difficulty. The                            patient tolerated the procedure well. The entire                            colon was well visualized. Scope In: 9:27:29 AM Scope Out: 9:47:06 AM Scope Withdrawal Time: 0 hours 11 minutes 32 seconds  Total Procedure Duration: 0 hours 19 minutes 37 seconds  Findings:      Two sessile polyps were found in the descending colon and cecum. The       polyps were 3 to 4 mm in size. These polyps were removed with a cold       snare. Resection and retrieval were complete. Estimated blood loss was       minimal.      Scattered small and large-mouthed diverticula were found in the entire       colon.      The exam was otherwise without abnormality on direct and retroflexion       views. Impression:               - Two 3 to 4 mm polyps in the descending colon and                            in the cecum, removed with a cold snare. Resected                            and retrieved.                           - Diverticulosis in the entire examined colon.                           -  The examination was otherwise normal on direct                            and retroflexion views. Moderate Sedation:      Moderate (conscious) sedation was personally administered by an       anesthesia professional. The following parameters were monitored: oxygen       saturation, heart rate, blood pressure, respiratory rate, EKG, adequacy       of pulmonary ventilation, and response to care. Recommendation:           - Patient has a contact number available for                            emergencies. The signs and symptoms of potential                            delayed complications were  discussed with the                            patient. Return to normal activities tomorrow.                            Written discharge instructions were provided to the                            patient.                           - Advance diet as tolerated.                           - Continue present medications.                           - Repeat colonoscopy date to be determined after                            pending pathology results are reviewed for                            surveillance.                           - Return to GI office (date not yet determined). Procedure Code(s):        --- Professional ---                           [phone], Colonoscopy, flexible; with removal of                            tumor(s), polyp(s), or other lesion(s) by snare                            technique Diagnosis Code(s):        --- Professional ---  Z12.11, Encounter for screening for malignant                            neoplasm of colon                           K63.5, Polyp of colon                           K57.30, Diverticulosis of large intestine without                            perforation or abscess without bleeding CPT copyright 2019 American Medical Association. All rights reserved. The codes documented in this report are preliminary and upon coder review may  be revised to meet current compliance requirements. Cristopher Estimable. Kerrigan Gombos, MD Norvel Richards, MD 10/07/2020 10:05:10 AM This report has been signed electronically. Number of Addenda: 0

## 2020-10-07 NOTE — Anesthesia Postprocedure Evaluation (Signed)
Anesthesia Post Note  Patient: [name]  Procedure(s) Performed: COLONOSCOPY WITH PROPOFOL (N/A ) POLYPECTOMY  Patient location during evaluation: PACU Anesthesia Type: General Level of consciousness: awake, oriented and awake and alert Pain management: pain level controlled Vital Signs Assessment: post-procedure vital signs reviewed and stable Respiratory status: spontaneous breathing and nonlabored ventilation Cardiovascular status: blood pressure returned to baseline and stable Postop Assessment: no apparent nausea or vomiting and adequate PO intake Anesthetic complications: no   No complications documented.   Last Vitals:  Vitals:   10/07/20 0901 10/07/20 0952  BP: 137/79 111/65  Pulse: 81   Resp: 14 18  Temp: 36.7 C 36.7 C  SpO2: 98% 93%    Last Pain:  Vitals:   10/07/20 0952  TempSrc: Axillary  PainSc: 0-No pain                 Karna Dupes

## 2020-10-07 NOTE — Discharge Instructions (Signed)
Colonoscopy Discharge Instructions  Read the instructions outlined below and refer to this sheet in the next few weeks. These discharge instructions provide you with general information on caring for yourself after you leave the hospital. Your doctor may also give you specific instructions. While your treatment has been planned according to the most current medical practices available, unavoidable complications occasionally occur. If you have any problems or questions after discharge, call Dr. Gala Romney at [phone]. ACTIVITY  You may resume your regular activity, but move at a slower pace for the next 24 hours.   Take frequent rest periods for the next 24 hours.   Walking will help get rid of the air and reduce the bloated feeling in your belly (abdomen).   No driving for 24 hours (because of the medicine (anesthesia) used during the test).    Do not sign any important legal documents or operate any machinery for 24 hours (because of the anesthesia used during the test).  NUTRITION  Drink plenty of fluids.   You may resume your normal diet as instructed by your doctor.   Begin with a light meal and progress to your normal diet. Heavy or fried foods are harder to digest and may make you feel sick to your stomach (nauseated).   Avoid alcoholic beverages for 24 hours or as instructed.  MEDICATIONS  You may resume your normal medications unless your doctor tells you otherwise.  WHAT YOU CAN EXPECT TODAY  Some feelings of bloating in the abdomen.   Passage of more gas than usual.   Spotting of blood in your stool or on the toilet paper.  IF YOU HAD POLYPS REMOVED DURING THE COLONOSCOPY:  No aspirin products for 7 days or as instructed.   No alcohol for 7 days or as instructed.   Eat a soft diet for the next 24 hours.  FINDING OUT THE RESULTS OF YOUR TEST Not all test results are available during your visit. If your test results are not back during the visit, make an appointment  with your caregiver to find out the results. Do not assume everything is normal if you have not heard from your caregiver or the medical facility. It is important for you to follow up on all of your test results.  SEEK IMMEDIATE MEDICAL ATTENTION IF:  You have more than a spotting of blood in your stool.   Your belly is swollen (abdominal distention).   You are nauseated or vomiting.   You have a temperature over 101.   You have abdominal pain or discomfort that is severe or gets worse throughout the day.    2 small polyps removed from your colon today  Diverticulosis and polyp information provided  Further recommendations to follow pending review of pathology report  At patient request, I called Maylene Roes [phone] -left message on voicemail with results and recommendations   Diverticulosis  Diverticulosis is a condition that develops when small pouches (diverticula) form in the wall of the large intestine (colon). The colon is where water is absorbed and stool (feces) is formed. The pouches form when the inside layer of the colon pushes through weak spots in the outer layers of the colon. You may have a few pouches or many of them. The pouches usually do not cause problems unless they become inflamed or infected. When this happens, the condition is called diverticulitis. What are the causes? The cause of this condition is not known. What increases the risk? The following factors may make you  more likely to develop this condition:  Being older than age 34. Your risk for this condition increases with age. Diverticulosis is rare among people younger than age 33. By age 51, many people have it.  Eating a low-fiber diet.  Having frequent constipation.  Being overweight.  Not getting enough exercise.  Smoking.  Taking over-the-counter pain medicines, like aspirin and ibuprofen.  Having a family history of diverticulosis. What are the signs or symptoms? In most  people, there are no symptoms of this condition. If you do have symptoms, they may include:  Bloating.  Cramps in the abdomen.  Constipation or diarrhea.  Pain in the lower left side of the abdomen. How is this diagnosed? Because diverticulosis usually has no symptoms, it is most often diagnosed during an exam for other colon problems. The condition may be diagnosed by:  Using a flexible scope to examine the colon (colonoscopy).  Taking an X-ray of the colon after dye has been put into the colon (barium enema).  Having a CT scan. How is this treated? You may not need treatment for this condition. Your health care provider may recommend treatment to prevent problems. You may need treatment if you have symptoms or if you previously had diverticulitis. Treatment may include:  Eating a high-fiber diet.  Taking a fiber supplement.  Taking a live bacteria supplement (probiotic).  Taking medicine to relax your colon. Follow these instructions at home: Medicines  Take over-the-counter and prescription medicines only as told by your health care provider.  If told by your health care provider, take a fiber supplement or probiotic. Constipation prevention Your condition may cause constipation. To prevent or treat constipation, you may need to:  Drink enough fluid to keep your urine pale yellow.  Take over-the-counter or prescription medicines.  Eat foods that are high in fiber, such as beans, whole grains, and fresh fruits and vegetables.  Limit foods that are high in fat and processed sugars, such as fried or sweet foods.  General instructions  Try not to strain when you have a bowel movement.  Keep all follow-up visits as told by your health care provider. This is important. Contact a health care provider if you:  Have pain in your abdomen.  Have bloating.  Have cramps.  Have not had a bowel movement in 3 days. Get help right away if:  Your pain gets worse.  Your  bloating becomes very bad.  You have a fever or chills, and your symptoms suddenly get worse.  You vomit.  You have bowel movements that are bloody or black.  You have bleeding from your rectum. Summary  Diverticulosis is a condition that develops when small pouches (diverticula) form in the wall of the large intestine (colon).  You may have a few pouches or many of them.  This condition is most often diagnosed during an exam for other colon problems.  Treatment may include increasing the fiber in your diet, taking supplements, or taking medicines. This information is not intended to replace advice given to you by your health care provider. Make sure you discuss any questions you have with your health care provider. Document Revised: 06/26/2019 Document Reviewed: 06/26/2019 Elsevier Patient Education  [place].     Colon Polyps  Polyps are tissue growths inside the body. Polyps can grow in many places, including the large intestine (colon). A polyp may be a round bump or a mushroom-shaped growth. You could have one polyp or several. Most colon polyps are noncancerous (  benign). However, some colon polyps can become cancerous over time. Finding and removing the polyps early can help prevent this. What are the causes? The exact cause of colon polyps is not known. What increases the risk? You are more likely to develop this condition if you:  Have a family history of colon cancer or colon polyps.  Are older than 17 or older than 45 if you are African American.  Have inflammatory bowel disease, such as ulcerative colitis or Crohn's disease.  Have certain hereditary conditions, such as: ? Familial adenomatous polyposis. ? Lynch syndrome. ? Turcot syndrome. ? Peutz-Jeghers syndrome.  Are overweight.  Smoke cigarettes.  Do not get enough exercise.  Drink too much alcohol.  Eat a diet that is high in fat and red meat and low in fiber.  Had childhood cancer  that was treated with abdominal radiation. What are the signs or symptoms? Most polyps do not cause symptoms. If you have symptoms, they may include:  Blood coming from your rectum when having a bowel movement.  Blood in your stool. The stool may look dark red or black.  Abdominal pain.  A change in bowel habits, such as constipation or diarrhea. How is this diagnosed? This condition is diagnosed with a colonoscopy. This is a procedure in which a lighted, flexible scope is inserted into the anus and then passed into the colon to examine the area. Polyps are sometimes found when a colonoscopy is done as part of routine cancer screening tests. How is this treated? Treatment for this condition involves removing any polyps that are found. Most polyps can be removed during a colonoscopy. Those polyps will then be tested for cancer. Additional treatment may be needed depending on the results of testing. Follow these instructions at home: Lifestyle  Maintain a healthy weight, or lose weight if recommended by your health care provider.  Exercise every day or as told by your health care provider.  Do not use any products that contain nicotine or tobacco, such as cigarettes and e-cigarettes. If you need help quitting, ask your health care provider.  If you drink alcohol, limit how much you have: ? 0-1 drink a day for women. ? 0-2 drinks a day for men.  Be aware of how much alcohol is in your drink. In the [place] one drink equals one 12 oz bottle of beer (355 mL), one 5 oz glass of wine (148 mL), or one 1 oz shot of hard liquor (44 mL). Eating and drinking   Eat foods that are high in fiber, such as fruits, vegetables, and whole grains.  Eat foods that are high in calcium and vitamin D, such as milk, cheese, yogurt, eggs, liver, fish, and broccoli.  Limit foods that are high in fat, such as fried foods and desserts.  Limit the amount of red meat and processed meat you eat, such as hot  dogs, sausage, bacon, and lunch meats. General instructions  Keep all follow-up visits as told by your health care provider. This is important. ? This includes having regularly scheduled colonoscopies. ? Talk to your health care provider about when you need a colonoscopy. Contact a health care provider if:  You have new or worsening bleeding during a bowel movement.  You have new or increased blood in your stool.  You have a change in bowel habits.  You lose weight for no known reason. Summary  Polyps are tissue growths inside the body. Polyps can grow in many places, including the colon.  Most colon polyps are noncancerous (benign), but some can become cancerous over time.  This condition is diagnosed with a colonoscopy.  Treatment for this condition involves removing any polyps that are found. Most polyps can be removed during a colonoscopy. This information is not intended to replace advice given to you by your health care provider. Make sure you discuss any questions you have with your health care provider. Document Revised: 03/14/2018 Document Reviewed: 03/14/2018 Elsevier Patient Education  [place].

## 2020-10-07 NOTE — Anesthesia Preprocedure Evaluation (Signed)
Anesthesia Evaluation  Patient identified by MRN, date of birth, ID band  Reviewed: Allergy & Precautions, NPO status , Patient's Chart, lab work & pertinent test results, reviewed documented beta blocker date and time   History of Anesthesia Complications Negative for: history of anesthetic complications  Airway Mallampati: II  TM Distance: >3 FB Neck ROM: Full    Dental no notable dental hx.    Pulmonary neg pulmonary ROS,    Pulmonary exam normal breath sounds clear to auscultation       Cardiovascular hypertension, Normal cardiovascular exam Rhythm:Regular Rate:Normal     Neuro/Psych negative neurological ROS     GI/Hepatic negative GI ROS, Neg liver ROS,   Endo/Other    Renal/GU      Musculoskeletal  (+) Arthritis , Osteoarthritis,    Abdominal   Peds  Hematology   Anesthesia Other Findings   Reproductive/Obstetrics                             Anesthesia Physical Anesthesia Plan  ASA: II  Anesthesia Plan: General   Post-op Pain Management:    Induction: Intravenous  PONV Risk Score and Plan:   Airway Management Planned: Nasal Cannula  Additional Equipment:   Intra-op Plan:   Post-operative Plan:   Informed Consent: I have reviewed the patients History and Physical, chart, labs and discussed the procedure including the risks, benefits and alternatives for the proposed anesthesia with the patient or authorized representative who has indicated his/her understanding and acceptance.     Dental advisory given  Plan Discussed with: CRNA  Anesthesia Plan Comments:         Anesthesia Quick Evaluation

## 2020-10-07 NOTE — Transfer of Care (Signed)
Immediate Anesthesia Transfer of Care Note  Patient: [name]  Procedure(s) Performed: COLONOSCOPY WITH PROPOFOL (N/A ) POLYPECTOMY  Patient Location: PACU  Anesthesia Type:General  Level of Consciousness: awake, alert  and oriented  Airway & Oxygen Therapy: Patient Spontanous Breathing  Post-op Assessment: Report given to RN and Post -op Vital signs reviewed and stable  Post vital signs: Reviewed and stable  Last Vitals:  Vitals Value Taken Time  BP 111/65 10/07/20 0952  Temp 36.7 C 10/07/20 0952  Pulse    Resp 18 10/07/20 0952  SpO2 93 % 10/07/20 0952    Last Pain:  Vitals:   10/07/20 0952  TempSrc: Axillary  PainSc: 0-No pain      Patients Stated Pain Goal: 8 (73/22/02 [id number])  Complications: No complications documented.

## 2020-10-07 NOTE — Interval H&P Note (Signed)
History and Physical Interval Note:  10/07/2020 9:22 AM  Bonna Gains  has presented today for surgery, with the diagnosis of fh crc.  The various methods of treatment have been discussed with the patient and family. After consideration of risks, benefits and other options for treatment, the patient has consented to  Procedure(s) with comments: COLONOSCOPY WITH PROPOFOL (N/A) - 9:45am as a surgical intervention.  The patient's history has been reviewed, patient examined, no change in status, stable for surgery.  I have reviewed the patient's chart and labs.  Questions were answered to the patient's satisfaction.     Halli Equihua  No Change.  Colonoscopy per plan.  The risks, benefits, limitations, alternatives and imponderables have been reviewed with the patient. Questions have been answered. All parties are agreeable.

## 2020-10-08 ENCOUNTER — Encounter: Payer: Self-pay | Admitting: Internal Medicine

## 2020-10-08 LAB — SURGICAL PATHOLOGY

## 2020-10-12 ENCOUNTER — Encounter (HOSPITAL_COMMUNITY): Payer: Self-pay | Admitting: Internal Medicine

## 2020-11-19 ENCOUNTER — Other Ambulatory Visit: Payer: Self-pay | Admitting: Hematology

## 2020-11-22 ENCOUNTER — Other Ambulatory Visit: Payer: Self-pay

## 2020-11-22 ENCOUNTER — Encounter: Payer: Self-pay | Admitting: Nurse Practitioner

## 2020-11-22 ENCOUNTER — Inpatient Hospital Stay: Payer: Medicare PPO | Attending: Nurse Practitioner | Admitting: Nurse Practitioner

## 2020-11-22 VITALS — BP 137/72 | HR 79 | Temp 98.6°F | Resp 19 | Ht 61.0 in | Wt 200.2 lb

## 2020-11-22 DIAGNOSIS — Z170 Estrogen receptor positive status [ER+]: Secondary | ICD-10-CM | POA: Diagnosis not present

## 2020-11-22 DIAGNOSIS — Z79811 Long term (current) use of aromatase inhibitors: Secondary | ICD-10-CM | POA: Diagnosis not present

## 2020-11-22 DIAGNOSIS — E669 Obesity, unspecified: Secondary | ICD-10-CM | POA: Diagnosis not present

## 2020-11-22 DIAGNOSIS — C50111 Malignant neoplasm of central portion of right female breast: Secondary | ICD-10-CM | POA: Diagnosis not present

## 2020-11-22 DIAGNOSIS — M2550 Pain in unspecified joint: Secondary | ICD-10-CM | POA: Diagnosis not present

## 2020-11-22 DIAGNOSIS — Z800 Family history of malignant neoplasm of digestive organs: Secondary | ICD-10-CM | POA: Diagnosis not present

## 2020-11-22 DIAGNOSIS — Z79899 Other long term (current) drug therapy: Secondary | ICD-10-CM | POA: Diagnosis not present

## 2020-11-22 DIAGNOSIS — Z808 Family history of malignant neoplasm of other organs or systems: Secondary | ICD-10-CM | POA: Insufficient documentation

## 2020-11-22 DIAGNOSIS — R232 Flushing: Secondary | ICD-10-CM | POA: Insufficient documentation

## 2020-11-22 DIAGNOSIS — Z806 Family history of leukemia: Secondary | ICD-10-CM | POA: Insufficient documentation

## 2020-11-22 DIAGNOSIS — E785 Hyperlipidemia, unspecified: Secondary | ICD-10-CM | POA: Diagnosis not present

## 2020-11-22 DIAGNOSIS — Z923 Personal history of irradiation: Secondary | ICD-10-CM | POA: Diagnosis not present

## 2020-11-22 DIAGNOSIS — I10 Essential (primary) hypertension: Secondary | ICD-10-CM | POA: Insufficient documentation

## 2020-11-22 DIAGNOSIS — Z8041 Family history of malignant neoplasm of ovary: Secondary | ICD-10-CM | POA: Diagnosis not present

## 2020-11-22 DIAGNOSIS — C50911 Malignant neoplasm of unspecified site of right female breast: Secondary | ICD-10-CM

## 2020-11-22 DIAGNOSIS — Z87442 Personal history of urinary calculi: Secondary | ICD-10-CM | POA: Diagnosis not present

## 2020-11-22 MED ORDER — EXEMESTANE 25 MG PO TABS
25.0000 mg | ORAL_TABLET | Freq: Every day | ORAL | 3 refills | Status: DC
Start: 2020-11-22 — End: 2021-11-18

## 2020-11-22 NOTE — Progress Notes (Signed)
CLINIC:  Survivorship   Patient Care Team: Shannon Frizzle, MD as PCP - General (Family Medicine) Shannon Germany, RN as Oncology Nurse Navigator Shannon Kaufmann, RN as Oncology Nurse Navigator Shannon Luna, MD as Consulting Physician (General Surgery) Shannon Merle, MD as Consulting Physician (Hematology) Shannon Pray, MD as Consulting Physician (Radiation Oncology) Shannon Feeling, NP as Nurse Practitioner (Nurse Practitioner)  REASON FOR VISIT:  Routine follow-up post-treatment for a recent history of breast cancer.  BRIEF ONCOLOGIC HISTORY:  Oncology History Overview Note  Cancer Staging malignant neoplasm right central breast Staging form: Breast, AJCC 8th Edition - Clinical Jacobson from 05/04/2020: Jacobson IA (cT1, cN0, cM0, G2, ER+, PR+, HER2-) - Signed by Shannon Feeling, NP on 05/24/2020 - Pathologic Jacobson from 06/08/2020: Jacobson IA (pT1c, pN0, cM0, G2, ER+, PR+, HER2-, Oncotype DX score: 24) - Signed by Shannon Merle, MD on 08/22/2020    malignant neoplasm right central breast  04/27/2020 Breast US   FINDINGS: Mammogram: Spot compression tomosynthesis views of the right breast were performed. There is persistence of architectural distortion in the lower central to slightly outer right breast. The area of distortion spans approximately 0.9 cm. Ultrasound: Targeted ultrasound is performed in the right breast at 6 o'clock 1 cm from the nipple demonstrating an irregular hypoechoic mass measuring 0.8 x 0.6 x 0.6 cm. The surrounding tissues are distorted. No internal blood flow identified. Targeted ultrasound of the right axilla demonstrates normal-appearing lymph nodes.   IMPRESSION: Right breast mass at 6 o'clock measuring 0.8 cm is suspicious and likely corresponds to the distortion seen mammographically.   05/04/2020 Cancer Staging   Staging form: Breast, AJCC 8th Edition - Clinical Jacobson from 05/04/2020: Jacobson IA (cT1, cN0, cM0, G2, ER+, PR+, HER2-) - Signed by Shannon Feeling, NP on 05/24/2020   05/04/2020 Initial Biopsy   FINAL MICROSCOPIC DIAGNOSIS:  A. BREAST, RIGHT/6:00, BIOPSY:  - Invasive ductal carcinoma.  - Ductal carcinoma in situ.  - Complex sclerosing lesion. COMMENT:  The carcinoma appears grade 2.  The greatest linear extent of tumor in any one core is 7 mm.   Immunohistochemical and morphometric analysis performed manually  The tumor cells are NEGATIVE for Her2 (1+).  Estrogen Receptor:       POSITIVE, 90% STRONG STAINING  Progesterone Receptor:   POSITIVE, 90% STRONG STAINING  Proliferation Marker Ki-67:   10%    05/06/2020 Initial Diagnosis   malignant neoplasm right central breast   06/02/2020 Genetic Testing   Negative genetic testing:  No pathogenic variants detected on the [facility] Breast Cancer STAT panel + Common Hereditary Cancers Panel. A variant of uncertain significance (VUS) was detected in the AXIN2 gene called c.1235A>C. The report date is 06/02/2020.  The Breast Cancer STAT Panel offered by [facility] includes sequencing and deletion/duplication analysis for the following 9 genes:  ATM, BRCA1, BRCA2, CDH1, CHEK2, PALB2, PTEN, STK11 and TP53. The Common Hereditary Cancers Panel offered by [facility] includes sequencing and/or deletion duplication testing of the following 48 genes: APC, ATM, AXIN2, BARD1, BMPR1A, BRCA1, BRCA2, BRIP1, CDH1, CDK4, CDKN2A (p14ARF), CDKN2A (p16INK4a), CHEK2, CTNNA1, DICER1, EPCAM (Deletion/duplication testing only), GREM1 (promoter region deletion/duplication testing only), KIT, MEN1, MLH1, MSH2, MSH3, MSH6, MUTYH, NBN, NF1, NHTL1, PALB2, PDGFRA, PMS2, POLD1, POLE, PTEN, RAD50, RAD51C, RAD51D, RNF43, SDHB, SDHC, SDHD, SMAD4, SMARCA4. STK11, TP53, TSC1, TSC2, and VHL.  The following genes were evaluated for sequence changes only: SDHA and HOXB13 c.251G>A variant only.   06/08/2020 Cancer Staging   Staging form:  Breast, AJCC 8th Edition - Pathologic Jacobson from 06/08/2020: Jacobson IA (pT1c, pN0, cM0, G2, ER+, PR+,  HER2-, Oncotype DX score: 24) - Signed by Shannon Merle, MD on 08/22/2020   06/08/2020 Surgery   RIGHT BREAST LUMPECTOMY WITH RADIOACTIVE SEED AND SENTINEL LYMPH NODE MAPPING with Dr [name]    06/08/2020 Pathology Results   FINAL MICROSCOPIC DIAGNOSIS:   A. BREAST, RIGHT, LUMPECTOMY:  - Invasive ductal carcinoma, grade 2, spanning 1.1 cm.  - High grade ductal carcinoma in situ with necrosis.  - Biopsy site.  - Resection margins negative for invasive carcinoma.  - In situ carcinoma is <0.1 cm to the posterior margin broadly, see  comment.  - See oncology table.   B. LYMPH NODE, RIGHT AXILLARY, SENTINEL, EXCISION:  - One of one lymph nodes negative for carcinoma (0/1).    06/08/2020 Oncotype testing   Recurrence score 24  Distant recurrence risk at 9 years with AI or Tamoxifen alone is 10% There is less than 1% benefit of adjuvant chemotherapy.    07/26/2020 - 08/25/2020 Radiation Therapy   Adjuvant Radiation with Dr [name] 07/26/20-08/25/20   09/2020 -  Anti-estrogen oral therapy   Exemestane 40m once daily starting in 09/2020   11/22/2020 Survivorship   SCP delivered by Shannon Rue NP      INTERVAL HISTORY:  [name] initial meeting to review her survivorship care plan detailing her treatment course for breast cancer, as well as monitoring long-term side effects of that treatment, education regarding health maintenance, screening, and overall wellness and health promotion.     She feels well.  Continues Aromasin, tolerating with mild aches in her feet and left shoulder which were pre-existing.  This does not limit her activity her normal function.  Has occasional mild hot flash.  Energy and appetite are adequate, no change in bowel habits, GI/GYN bleeding, fever, chills, cough, chest pain, dyspnea, leg edema, or new concerns in her breast such as new lump/mass, nipple discharge or inversion, or skin change.    ONCOLOGY TREATMENT  TEAM:  1. [facility]  [facility] 2. Medical Oncologist: Dr. FBurr Jacobson 3. Radiation Oncologist: Dr. KSondra Jacobson   PAST MEDICAL/SURGICAL HISTORY:  Past Medical History:  Diagnosis Date  . Arthritis   . Family history of bone cancer   . Family history of colon cancer   . Family history of leukemia   . Family history of lung cancer   . Family history of melanoma   . Family history of ovarian cancer   . Family history of stomach cancer   . Family history of uterine cancer   . History of kidney stones   . Hyperlipemia   . Hypertension   . Invasive ductal carcinoma of breast ([place]   . Kidney stones before 101-01-2012  states she has passed stones 4 times/ tsf  . Lower extremity edema   . Obesity    Past Surgical History:  Procedure Laterality Date  . BREAST BIOPSY Left   . BREAST LUMPECTOMY WITH RADIOACTIVE SEED AND SENTINEL LYMPH NODE BIOPSY Right 06/08/2020   Procedure: RIGHT BREAST LUMPECTOMY WITH RADIOACTIVE SEED AND SENTINEL LYMPH NODE MAPPING;  Surgeon: CErroll Luna MD;  [place]  Service: General;  Laterality: Right;  PEC BLOCK  . CHOLECYSTECTOMY    . COLONOSCOPY  01/01/2012   Procedure: COLONOSCOPY;  Surgeon: RDaneil Dolin MD;  [facility];  Service: Endoscopy;  Laterality: N/A;  10:00 AM  . COLONOSCOPY WITH PROPOFOL N/A 10/07/2020   Procedure: COLONOSCOPY WITH PROPOFOL;  Surgeon: Daneil Dolin, MD;  [facility];  Service: Endoscopy;  Laterality: N/A;  9:45am  . JOINT REPLACEMENT  2006   left knee  . POLYPECTOMY  10/07/2020   Procedure: POLYPECTOMY;  Surgeon: Daneil Dolin, MD;  [facility];  Service: Endoscopy;;  . US ECHOCARDIOGRAPHY  08/21/2011   LA mild to mod. dilated,mild MR,TR     ALLERGIES:  No Known Allergies   CURRENT MEDICATIONS:  Outpatient Encounter Medications as of 11/22/2020  Medication Sig  . cholecalciferol (VITAMIN D) 25 MCG (1000 UNIT) tablet Take 2,000 Units by mouth daily.   Marland Kitchen ibuprofen (ADVIL,MOTRIN) 200 MG tablet Take 800 mg by mouth every 8 (eight) hours as needed for moderate pain.   . simvastatin (ZOCOR) 20 MG tablet Take 1 tablet (20 mg total) by mouth daily at 6 PM. Please schedule annual appt with Dr. Sallyanne Kuster for refills. [phone]. 2nd attempt.  . triamterene-hydrochlorothiazide (MAXZIDE-25) 37.5-25 MG tablet Take 1 tablet by mouth daily.  . [DISCONTINUED] exemestane (AROMASIN) 25 MG tablet Take 1 tablet (25 mg total) by mouth daily after breakfast.  . exemestane (AROMASIN) 25 MG tablet Take 1 tablet (25 mg total) by mouth daily after breakfast.   No facility-administered encounter medications on file as of 11/22/2020.     ONCOLOGIC FAMILY HISTORY:  Family History  Problem Relation Age of Onset  . Hypertension Mother   . Colon cancer Mother 93  . Hypertension Father   . Heart attack Father   . Melanoma Father        dx. in his early 32s (x3)  . Uterine cancer Maternal Aunt        dx. >50  . Cancer Daughter 39       ovarian (dysgerminoma)  . Leukemia Paternal Uncle        dx. in his late 6s  . Stomach cancer Maternal Grandmother        dx. in her 76s  . Bone cancer Maternal Aunt        dx. in her early 45s  . Lung cancer Maternal Aunt   . Testicular cancer Cousin        dx. in his late 30s/early 61s (maternal cousin)  . Anesthesia problems Neg Hx      GENETIC COUNSELING/TESTING: Yes, BUS and AXIN 2 gene  SOCIAL HISTORY:  [name] is here with her daughter, lives in [place], [place].  She denies any current or history of tobacco, alcohol, or illicit drug use.     PHYSICAL EXAMINATION:  Vital Signs:   Vitals:   11/22/20 1009  BP: 137/72  Pulse: 79  Resp: 19  Temp: 98.6 Shannon (37 C)  SpO2: 99%   Filed Weights   11/22/20 1009  Weight: 200 lb 3.2 oz (90.8 kg)   General: Well-appearing female in no acute distress.    HEENT:  Sclerae anicteric Lymph: No cervical, supraclavicular, or infraclavicular lymphadenopathy  noted on palpation.  Cardiovascular: Regular rate and rhythm. Respiratory: Clear; breathing non-labored.  GI: Abdomen soft and round; non-tender, non-distended. Bowel sounds normoactive.  Neuro: No focal deficits. Steady gait.  Psych: Mood and affect normal and appropriate for situation.  Extremities: No edema. MSK: No focal spinal tenderness to palpation.  Full range of motion in bilateral upper extremities Skin: Warm and dry. Breast exam: No nipple discharge or inversion bilaterally.  S/p right lumpectomy.  Incisions completely  healed.  No palpable mass in either breast or axilla that I could appreciate.  LABORATORY DATA:  None for this visit.  DIAGNOSTIC IMAGING:  None for this visit.      ASSESSMENT AND PLAN:  Ms.. Carpenter is a pleasant 69 y.o. female with Jacobson 1A right breast invasive ductal carcinoma with DCIS, ER+/PR+/HER2-, diagnosed in 04/2020, treated with lumpectomy, adjuvant radiation therapy, and anti-estrogen therapy with exemestane beginning in 09/2020.  She presents to the Survivorship Clinic for our initial meeting and routine follow-up post-completion of treatment for breast cancer.    1. Jacobson 1A right breast cancer:  Ms. Vandenheuvel has recovered well from definitive treatment for breast cancer.  Breast exam is benign, no clinical concern for recurrence.  She will follow-up with her medical oncologist, Dr. Burr Jacobson in 02/2021 with history and physical exam per surveillance protocol.  She will continue her anti-estrogen therapy with exemestane. Thus far, she is tolerating well with mild pre-existing joint pain and hot flashes. She was instructed to make Dr. Burr Jacobson or myself aware if she begins to experience any worsening side effects of the medication and I could see her back in clinic to help manage those side effects, as needed. Today, a comprehensive survivorship care plan and treatment summary was reviewed with the patient today detailing her breast cancer diagnosis, treatment  course, potential late/long-term effects of treatment, appropriate follow-up care with recommendations for the future, and patient education resources.  A copy of this summary, along with a letter will be sent to the patient's primary care provider via In Basket message after today's visit.    2. Bone health:  Given Ms. Domeier's age/history of breast cancer and her current treatment regimen including anti-estrogen therapy with exemestane, she is at risk for bone demineralization.  Her last DEXA scan was 09/07/2020, which was normal.  In the meantime, she was encouraged to increase her consumption of foods rich in calcium, as well as increase her weight-bearing activities.  She was given education on specific activities to promote bone health.  3. Cancer screening:  Due to Ms. Heber's history and her age, she should receive screening for skin cancers, colon cancer, and gynecologic cancers.  The information and recommendations are listed on the patient's comprehensive care plan/treatment summary and were reviewed in detail with the patient.    4. Health maintenance and wellness promotion: Ms. Schellinger was encouraged to consume 5-7 servings of fruits and vegetables per day. We reviewed the "Nutrition Rainbow" handout, as well as the handout "Take Control of Your Health and Reduce Your Cancer Risk" from the [place].  She was also encouraged to engage in moderate to vigorous exercise for 30 minutes per day most days of the week. We discussed the [employer] fitness program, which is designed for cancer survivors to help them become more physically fit after cancer treatments.  She was instructed to limit her alcohol consumption and continue to abstain from tobacco use.   5. Support services/counseling: It is not uncommon for this period of the patient's cancer care trajectory to be one of many emotions and stressors.  We discussed an opportunity for her to participate in the next session of  [facility] ("Finding Your New Normal") support group series designed for patients after they have completed treatment.   Ms. Moyd was encouraged to take advantage of our many other support services programs, support groups, and/or counseling in coping with her new life as a cancer survivor after completing anti-cancer treatment.  She was  offered support today through active listening and expressive supportive counseling.  She was given information regarding our available services and encouraged to contact me with any questions or for help enrolling in any of our support group/programs.    Dispo:   -Return to cancer center 02/2021 -Mammogram due in 03/2021 -Follow up with surgery and radiation as indicated -She is welcome to return back to the Survivorship Clinic at any time; no additional follow-up needed at this time.  -Consider referral back to survivorship as a long-term survivor for continued surveillance  Orders Placed This Encounter  Procedures  . MM DIAG BREAST TOMO BILATERAL    Standing Status:   Future    Standing Expiration Date:   11/22/2021    Order Specific Question:   Reason for Exam (SYMPTOM  OR DIAGNOSIS REQUIRED)    Answer:   History of right breast cancer s/p lumpectomy and radiation in 05/2020    Order Specific Question:   Preferred imaging location?    Answer:   [facility]    A total of (40) minutes of face-to-face time was spent with this patient with greater than 50% of that time in counseling and care-coordination.   Cira Rue, NP Survivorship Program [facility] [phone]   Note: PRIMARY CARE PROVIDER [name], [place] [phone]

## 2021-02-17 ENCOUNTER — Other Ambulatory Visit: Payer: Self-pay

## 2021-02-17 DIAGNOSIS — C50911 Malignant neoplasm of unspecified site of right female breast: Secondary | ICD-10-CM

## 2021-02-18 NOTE — Progress Notes (Signed)
[place]   Telephone:([phone]) [phone] Fax:(336) [phone]   Clinic Follow up Note   Patient Care Team: [name], MD as PCP - General (Family Medicine) [name], RN as Oncology Nurse Navigator [name], RN as Oncology Nurse Navigator [name], MD as Consulting Physician (General Surgery) [name], MD as Consulting Physician (Hematology) [name], MD as Consulting Physician (Radiation Oncology) [name], NP as Nurse Practitioner (Nurse Practitioner)  Date of Service:  02/21/2021  CHIEF COMPLAINT: F/u of right breast cancer   SUMMARY OF ONCOLOGIC HISTORY: Oncology History Overview Note  Cancer Staging malignant neoplasm right central breast Staging form: Breast, AJCC 8th Edition - Clinical stage from 05/04/2020: Stage IA (cT1, cN0, cM0, G2, ER+, PR+, HER2-) - Signed by [name], NP on 05/24/2020 - Pathologic stage from 06/08/2020: Stage IA (pT1c, pN0, cM0, G2, ER+, PR+, HER2-, Oncotype DX score: 24) - Signed by [name], MD on 08/22/2020    malignant neoplasm right central breast  04/27/2020 Breast US   FINDINGS: Mammogram: Spot compression tomosynthesis views of the right breast were performed. There is persistence of architectural distortion in the lower central to slightly outer right breast. The area of distortion spans approximately 0.9 cm. Ultrasound: Targeted ultrasound is performed in the right breast at 6 o'clock 1 cm from the nipple demonstrating an irregular hypoechoic mass measuring 0.8 x 0.6 x 0.6 cm. The surrounding tissues are distorted. No internal blood flow identified. Targeted ultrasound of the right axilla demonstrates normal-appearing lymph nodes.   IMPRESSION: Right breast mass at 6 o'clock measuring 0.8 cm is suspicious and likely corresponds to the distortion seen mammographically.   05/04/2020 Cancer Staging   Staging form: Breast, AJCC 8th Edition - Clinical stage from 05/04/2020: Stage  IA (cT1, cN0, cM0, G2, ER+, PR+, HER2-) - Signed by [name], NP on 05/24/2020   05/04/2020 Initial Biopsy   FINAL MICROSCOPIC DIAGNOSIS:  A. BREAST, RIGHT/6:00, BIOPSY:  - Invasive ductal carcinoma.  - Ductal carcinoma in situ.  - Complex sclerosing lesion. COMMENT:  The carcinoma appears grade 2.  The greatest linear extent of tumor in any one core is 7 mm.   Immunohistochemical and morphometric analysis performed manually  The tumor cells are NEGATIVE for Her2 (1+).  Estrogen Receptor:       POSITIVE, 90% STRONG STAINING  Progesterone Receptor:   POSITIVE, 90% STRONG STAINING  Proliferation Marker Ki-67:   10%    05/06/2020 Initial Diagnosis   malignant neoplasm right central breast   06/02/2020 Genetic Testing   Negative genetic testing:  No pathogenic variants detected on the [facility] Breast Cancer STAT panel + Common Hereditary Cancers Panel. A variant of uncertain significance (VUS) was detected in the AXIN2 gene called c.1235A>C. The report date is 06/02/2020.  The Breast Cancer STAT Panel offered by [facility] includes sequencing and deletion/duplication analysis for the following 9 genes:  ATM, BRCA1, BRCA2, CDH1, CHEK2, PALB2, PTEN, STK11 and TP53. The Common Hereditary Cancers Panel offered by [facility] includes sequencing and/or deletion duplication testing of the following 48 genes: APC, ATM, AXIN2, BARD1, BMPR1A, BRCA1, BRCA2, BRIP1, CDH1, CDK4, CDKN2A (p14ARF), CDKN2A (p16INK4a), CHEK2, CTNNA1, DICER1, EPCAM (Deletion/duplication testing only), GREM1 (promoter region deletion/duplication testing only), KIT, MEN1, MLH1, MSH2, MSH3, MSH6, MUTYH, NBN, NF1, NHTL1, PALB2, PDGFRA, PMS2, POLD1, POLE, PTEN, RAD50, RAD51C, RAD51D, RNF43, SDHB, SDHC, SDHD, SMAD4, SMARCA4. STK11, TP53, TSC1, TSC2, and VHL.  The following genes were evaluated for sequence changes only: SDHA and HOXB13  c.251G>A variant only.   06/08/2020 Cancer Staging   Staging form: Breast, AJCC 8th Edition - Pathologic  stage from 06/08/2020: Stage IA (pT1c, pN0, cM0, G2, ER+, PR+, HER2-, Oncotype DX score: 24) - Signed by [name], MD on 08/22/2020   06/08/2020 Surgery   RIGHT BREAST LUMPECTOMY WITH RADIOACTIVE SEED AND SENTINEL LYMPH NODE MAPPING with Dr Brantley Stage    06/08/2020 Pathology Results   FINAL MICROSCOPIC DIAGNOSIS:   A. BREAST, RIGHT, LUMPECTOMY:  - Invasive ductal carcinoma, grade 2, spanning 1.1 cm.  - High grade ductal carcinoma in situ with necrosis.  - Biopsy site.  - Resection margins negative for invasive carcinoma.  - In situ carcinoma is <0.1 cm to the posterior margin broadly, see  comment.  - See oncology table.   B. LYMPH NODE, RIGHT AXILLARY, SENTINEL, EXCISION:  - One of one lymph nodes negative for carcinoma (0/1).    06/08/2020 Oncotype testing   Recurrence score 24  Distant recurrence risk at 9 years with AI or Tamoxifen alone is 10% There is less than 1% benefit of adjuvant chemotherapy.    07/26/2020 - 08/25/2020 Radiation Therapy   Adjuvant Radiation with Dr [name] 07/26/20-08/25/20   09/2020 -  Anti-estrogen oral therapy   Exemestane 55m once daily starting in 09/2020   11/22/2020 Survivorship   SCP delivered by [name] NP       CURRENT THERAPY:  Exemestane 272monce daily starting in 09/2020  INTERVAL HISTORY:  Shannon MARTINOVICHs here for a follow up of right breast cancer. She was last seen by me 6 months ago. She presents to the clinic with her daughter. She notes she is doing well. She is on Exemestane and tolerating well so far. She has joint stiffness in her hands and known arthritis has not progressed much. She notes trouble sleeping as she often gets interrupted. She denies any new bone pain.    REVIEW OF SYSTEMS:   Constitutional: Denies fevers, chills or abnormal weight loss Eyes: Denies blurriness of vision Ears, nose, mouth, throat, and face: Denies mucositis or sore throat Respiratory: Denies cough, dyspnea or wheezes Cardiovascular: Denies  palpitation, chest discomfort or lower extremity swelling Gastrointestinal:  Denies nausea, heartburn or change in bowel habits Skin: Denies abnormal skin rashes MSK: (+) Stable arthritis, joint stiffness Lymphatics: Denies new lymphadenopathy or easy bruising Neurological:Denies numbness, tingling or new weaknesses Behavioral/Psych: Mood is stable, no new changes  All other systems were reviewed with the patient and are negative.  MEDICAL HISTORY:  Past Medical History:  Diagnosis Date  . Arthritis   . Family history of bone cancer   . Family history of colon cancer   . Family history of leukemia   . Family history of lung cancer   . Family history of melanoma   . Family history of ovarian cancer   . Family history of stomach cancer   . Family history of uterine cancer   . History of kidney stones   . Hyperlipemia   . Hypertension   . Invasive ductal carcinoma of breast ([place]  . Kidney stones before 1201/24/2013 states she has passed stones 4 times/ tsf  . Lower extremity edema   . Obesity     SURGICAL HISTORY: Past Surgical History:  Procedure Laterality Date  . BREAST BIOPSY Left   . BREAST LUMPECTOMY WITH RADIOACTIVE SEED AND SENTINEL LYMPH NODE BIOPSY Right 06/08/2020   Procedure: RIGHT BREAST LUMPECTOMY WITH RADIOACTIVE SEED AND SENTINEL LYMPH NODE MAPPING;  Surgeon: [name], MD;  Location: [facility];  Service: General;  Laterality: Right;  PEC BLOCK  . CHOLECYSTECTOMY    . COLONOSCOPY  01/01/2012   Procedure: COLONOSCOPY;  Surgeon: Daneil Dolin, MD;  [facility];  Service: Endoscopy;  Laterality: N/A;  10:00 AM  . COLONOSCOPY WITH PROPOFOL N/A 10/07/2020   Procedure: COLONOSCOPY WITH PROPOFOL;  Surgeon: Daneil Dolin, MD;  [facility];  Service: Endoscopy;  Laterality: N/A;  9:45am  . JOINT REPLACEMENT  2006   left knee  . POLYPECTOMY  10/07/2020   Procedure: POLYPECTOMY;  Surgeon: Daneil Dolin, MD;  [facility];   Service: Endoscopy;;  . US ECHOCARDIOGRAPHY  08/21/2011   LA mild to mod. dilated,mild MR,TR    I have reviewed the social history and family history with the patient and they are unchanged from previous note.  ALLERGIES:  has No Known Allergies.  MEDICATIONS:  Current Outpatient Medications  Medication Sig Dispense Refill  . cholecalciferol (VITAMIN D) 25 MCG (1000 UNIT) tablet Take 2,000 Units by mouth daily.    Marland Kitchen exemestane (AROMASIN) 25 MG tablet Take 1 tablet (25 mg total) by mouth daily after breakfast. 90 tablet 3  . ibuprofen (ADVIL,MOTRIN) 200 MG tablet Take 800 mg by mouth every 8 (eight) hours as needed for moderate pain.     . simvastatin (ZOCOR) 20 MG tablet Take 1 tablet (20 mg total) by mouth daily at 6 PM. Please schedule annual appt with Dr. Sallyanne Kuster for refills. [phone]. 2nd attempt. 90 tablet 3  . triamterene-hydrochlorothiazide (MAXZIDE-25) 37.5-25 MG tablet Take 1 tablet by mouth daily. 90 tablet 3   No current facility-administered medications for this visit.    PHYSICAL EXAMINATION: ECOG PERFORMANCE STATUS: 0 - Asymptomatic  Vitals:   02/21/21 1114  BP: (!) 137/91  Pulse: 92  Resp: 16  Temp: 99.5 F (37.5 C)  SpO2: 99%   Filed Weights   02/21/21 1114  Weight: 198 lb 11.2 oz (90.1 kg)    GENERAL:alert, no distress and comfortable SKIN: skin color, texture, turgor are normal, no rashes or significant lesions (+) Multiple skin tags across chest EYES: normal, Conjunctiva are pink and non-injected, sclera clear  NECK: supple, thyroid normal size, non-tender, without nodularity LYMPH:  no palpable lymphadenopathy in the cervical, axillary  LUNGS: clear to auscultation and percussion with normal breathing effort HEART: regular rate & rhythm and no murmurs (+) lower extremity edema ABDOMEN:abdomen soft, non-tender and normal bowel sounds Musculoskeletal:no cyanosis of digits and no clubbing  NEURO: alert & oriented x 3 with fluent speech, no focal  motor/sensory deficits BREAST: s/p right lumpectomy: surgical incision healed well, no residual skin hyperpigmentation. (+) minimal swelling around right nipple. No palpable mass, nodules or adenopathy bilaterally. Breast exam benign.   LABORATORY DATA:  I have reviewed the data as listed CBC Latest Ref Rng & Units 02/21/2021 06/04/2020 04/05/2020  WBC 4.0 - 10.5 K/uL 8.9 8.8 9.2  Hemoglobin 12.0 - 15.0 g/dL 13.3 13.0 13.5  Hematocrit 36.0 - 46.0 % 40.7 41.3 40.8  Platelets 150 - 400 K/uL 206 221 253     CMP Latest Ref Rng & Units 10/06/2020 06/04/2020 04/05/2020  Glucose 70 - 99 mg/dL 101(H) 95 94  BUN 8 - 23 mg/dL _0 Creatinine 0.44 - 1.00 mg/dL 0.79 0.86 0.86  Sodium 135 - 145 mmol/L 137 139 140  Potassium 3.5 - 5.1 mmol/L 4.1 3.9 4.5  Chloride 98 - 111 mmol/L 101  102 102  CO2 22 - 32 mmol/L _0 Calcium 8.9 - 10.3 mg/dL 9.8 9.9 10.6(H)  Total Protein 6.5 - 8.1 g/dL - 7.0 7.3  Total Bilirubin 0.3 - 1.2 mg/dL - 1.6(H) 1.5(H)  Alkaline Phos 38 - 126 U/L - 61 -  AST 15 - 41 U/L - 20 19  ALT 0 - 44 U/L - 21 16      RADIOGRAPHIC STUDIES: I have personally reviewed the radiological images as listed and agreed with the findings in the report. No results found.   ASSESSMENT & PLAN:  [name] is a 70 y.o. female with   1. Invasive ductal carcinoma and DCIS of the central right breast, ER+/PR+/HER2-, Grade 2, pT1cN0M0, Oncotype RS 24 -She was diagnosed in 04/2020 with 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  -She underwent right breast lumpectomy and SLNB with Dr Brantley Stage on 06/08/20. Her RS was 24. I did not recommend adjuvant chemotherapy. She is s/p Adjuvant Radiation.  -I started her on antiestrogen therapy with Exemestane in 09/2020. Tolerating well with mild joint stiffness and fatigue.  -She is clinically doing well. Lab reviewed, her CBC and CMP are within normal limits. Her physical exam and was unremarkable. There is no clinical concern for  recurrence. -Continue surveillance. Next mammogram on 03/25/21. Due to her high risk of future breast cancer from her DCIS, I discussed the option of additional screening with annual breast MRIs. I also discussed Abbreviated MRIs which have $400 out-of-pocket cost if insurance does not cover this. She is interested, will start in 08/2021. Will order on next visit.  -Continue Exemestane.  -F/u in 4 months. I reviewed what concerning side effects to watch for in interim.    2. Genetic Testing negative for pathogenetic mutations.   3. Bone Health  -Her 06/2017 DEXA was normal. Her 09/07/20 DEXA was normal with T-score -0.7 at right femur neck.  -Given Exemestane can weaken her bone, will repeat DEXA every 2 years.   4. Arthritis, insomnia  -She has chronic arthritis in her knees, hips and back.  -she has had a right knee replacement in the past. She my proceed with left knee replacement soon  -Arthritis stable on Exemestane mostly.  -For trouble sleeping, I recommend OTC melatonin or benadryl first.    PLAN: -Diagnostic Mammogram on 03/25/21 -Continue Exemestane  -Lab and f/u in 4 months, will order screening breast MRI on next visit    No problem-specific Assessment & Plan notes found for this encounter.   No orders of the defined types were placed in this encounter.  All questions were answered. The patient knows to call the clinic with any problems, questions or concerns. No barriers to learning was detected. The total time spent in the appointment was 30 minutes.     [name], MD 02/21/2021   I, Joslyn Devon, am acting as scribe for [name], MD.   I have reviewed the above documentation for accuracy and completeness, and I agree with the above.

## 2021-02-21 ENCOUNTER — Other Ambulatory Visit: Payer: Self-pay

## 2021-02-21 ENCOUNTER — Encounter: Payer: Self-pay | Admitting: Hematology

## 2021-02-21 ENCOUNTER — Inpatient Hospital Stay: Payer: Medicare PPO

## 2021-02-21 ENCOUNTER — Telehealth: Payer: Self-pay | Admitting: Hematology

## 2021-02-21 ENCOUNTER — Inpatient Hospital Stay: Payer: Medicare PPO | Attending: Nurse Practitioner | Admitting: Hematology

## 2021-02-21 VITALS — BP 137/91 | HR 92 | Temp 99.5°F | Resp 16 | Ht 61.0 in | Wt 198.7 lb

## 2021-02-21 DIAGNOSIS — Z79811 Long term (current) use of aromatase inhibitors: Secondary | ICD-10-CM | POA: Insufficient documentation

## 2021-02-21 DIAGNOSIS — C50111 Malignant neoplasm of central portion of right female breast: Secondary | ICD-10-CM | POA: Insufficient documentation

## 2021-02-21 DIAGNOSIS — C50911 Malignant neoplasm of unspecified site of right female breast: Secondary | ICD-10-CM

## 2021-02-21 DIAGNOSIS — Z170 Estrogen receptor positive status [ER+]: Secondary | ICD-10-CM | POA: Diagnosis not present

## 2021-02-21 LAB — CMP (CANCER CENTER ONLY)
ALT: 20 U/L (ref 0–44)
AST: 18 U/L (ref 15–41)
Albumin: 4.2 g/dL (ref 3.5–5.0)
Alkaline Phosphatase: 68 U/L (ref 38–126)
Anion gap: 8 (ref 5–15)
BUN: 18 mg/dL (ref 8–23)
CO2: 28 mmol/L (ref 22–32)
Calcium: 10 mg/dL (ref 8.9–10.3)
Chloride: 103 mmol/L (ref 98–111)
Creatinine: 0.94 mg/dL (ref 0.44–1.00)
GFR, Estimated: >60 mL/min (ref >60)
Glucose, Bld: 111 mg/dL — ABNORMAL HIGH (ref 70–99)
Potassium: 3.9 mmol/L (ref 3.5–5.1)
Sodium: 139 mmol/L (ref 135–145)
Total Bilirubin: 1.4 mg/dL — ABNORMAL HIGH (ref 0.3–1.2)
Total Protein: 7.5 g/dL (ref 6.5–8.1)

## 2021-02-21 LAB — CBC WITH DIFFERENTIAL (CANCER CENTER ONLY)
Abs Immature Granulocytes: 0.03 10*3/uL (ref 0.00–0.07)
Basophils Absolute: 0.1 10*3/uL (ref 0.0–0.1)
Basophils Relative: 1 %
Eosinophils Absolute: 0.2 10*3/uL (ref 0.0–0.5)
Eosinophils Relative: 2 %
HCT: 40.7 % (ref 36.0–46.0)
Hemoglobin: 13.3 g/dL (ref 12.0–15.0)
Immature Granulocytes: 0 %
Lymphocytes Relative: 19 %
Lymphs Abs: 1.7 10*3/uL (ref 0.7–4.0)
MCH: 27.7 pg (ref 26.0–34.0)
MCHC: 32.7 g/dL (ref 30.0–36.0)
MCV: 84.8 fL (ref 80.0–100.0)
Monocytes Absolute: 0.5 10*3/uL (ref 0.1–1.0)
Monocytes Relative: 5 %
Neutro Abs: 6.5 10*3/uL (ref 1.7–7.7)
Neutrophils Relative %: 73 %
Platelet Count: 206 10*3/uL (ref 150–400)
RBC: 4.8 MIL/uL (ref 3.87–5.11)
RDW: 13.7 % (ref 11.5–15.5)
WBC Count: 8.9 10*3/uL (ref 4.0–10.5)
nRBC: 0 % (ref 0.0–0.2)

## 2021-02-21 NOTE — Telephone Encounter (Signed)
Scheduled per 03/14 los, patient received updated calender.

## 2021-03-10 ENCOUNTER — Ambulatory Visit (INDEPENDENT_AMBULATORY_CARE_PROVIDER_SITE_OTHER): Payer: Medicare PPO | Admitting: Family Medicine

## 2021-03-10 ENCOUNTER — Other Ambulatory Visit: Payer: Self-pay

## 2021-03-10 ENCOUNTER — Encounter: Payer: Self-pay | Admitting: Family Medicine

## 2021-03-10 VITALS — BP 138/70 | HR 88 | Temp 98.1°F | Resp 14 | Ht 61.0 in | Wt 198.0 lb

## 2021-03-10 DIAGNOSIS — E7800 Pure hypercholesterolemia, unspecified: Secondary | ICD-10-CM | POA: Diagnosis not present

## 2021-03-10 DIAGNOSIS — I10 Essential (primary) hypertension: Secondary | ICD-10-CM | POA: Diagnosis not present

## 2021-03-10 LAB — LIPID PANEL
Cholesterol: 186 mg/dL (ref <200)
HDL: 56 mg/dL (ref >=50)
LDL Cholesterol (Calc): 103 mg/dL (calc) — ABNORMAL HIGH
Non-HDL Cholesterol (Calc): 130 mg/dL (calc) — ABNORMAL HIGH (ref <130)
Total CHOL/HDL Ratio: 3.3 (calc) (ref <5.0)
Triglycerides: 154 mg/dL — ABNORMAL HIGH (ref <150)

## 2021-03-10 LAB — BASIC METABOLIC PANEL WITH GFR
BUN: 16 mg/dL (ref 7–25)
CO2: 27 mmol/L (ref 20–32)
Calcium: 10.7 mg/dL — ABNORMAL HIGH (ref 8.6–10.4)
Chloride: 101 mmol/L (ref 98–110)
Creat: 0.9 mg/dL (ref 0.50–0.99)
GFR, Est African American: 76 mL/min/{1.73_m2} (ref >=60)
GFR, Est Non African American: 65 mL/min/{1.73_m2} (ref >=60)
Glucose, Bld: 85 mg/dL (ref 65–99)
Potassium: 4.4 mmol/L (ref 3.5–5.3)
Sodium: 139 mmol/L (ref 135–146)

## 2021-03-10 MED ORDER — SIMVASTATIN 20 MG PO TABS
20.0000 mg | ORAL_TABLET | Freq: Every day | ORAL | 3 refills | Status: DC
Start: 2021-03-10 — End: 2022-03-01

## 2021-03-10 MED ORDER — TRIAMTERENE-HCTZ 37.5-25 MG PO TABS
1.0000 | ORAL_TABLET | Freq: Every day | ORAL | 3 refills | Status: DC
Start: 2021-03-10 — End: 2022-03-07

## 2021-03-10 NOTE — Progress Notes (Signed)
Subjective:    Patient ID: [name], female    DOB: 1951/12/09, 70 y.o.   MRN: [id number]  HPI  Patient is a very sweet 70 year old Caucasian female who presents today for a follow-up of her blood pressure and cholesterol.  She is currently on Aromasin for breast cancer prevention and treatment.  She is also on Maxide for hypertension as well as Zocor for hyperlipidemia.  She has trace to +1 pitting edema in both legs with evidence of chronic venous insufficiency.  She reports occasional leg swelling.  She also reports that she eats salt.  She denies any chest pain or shortness of breath.  At the cancer center recently she had a CMP which was normal aside from a nonfasting sugar that was 111.  However she admits that this was nonfasting.  Her liver tests were normal.  Her potassium was normal.  Her blood pressure today is excellent at 138/70. Past Medical History:  Diagnosis Date  . Arthritis   . Family history of bone cancer   . Family history of colon cancer   . Family history of leukemia   . Family history of lung cancer   . Family history of melanoma   . Family history of ovarian cancer   . Family history of stomach cancer   . Family history of uterine cancer   . History of kidney stones   . Hyperlipemia   . Hypertension   . Invasive ductal carcinoma of breast ([place])   . Kidney stones before 12/23/2011   states she has passed stones 4 times/ tsf  . Lower extremity edema   . Obesity    Past Surgical History:  Procedure Laterality Date  . BREAST BIOPSY Left   . BREAST LUMPECTOMY WITH RADIOACTIVE SEED AND SENTINEL LYMPH NODE BIOPSY Right 06/08/2020   Procedure: RIGHT BREAST LUMPECTOMY WITH RADIOACTIVE SEED AND SENTINEL LYMPH NODE MAPPING;  Surgeon: [name], MD;  Location: [place];  Service: General;  Laterality: Right;  PEC BLOCK  . CHOLECYSTECTOMY    . COLONOSCOPY  01/01/2012   Procedure: COLONOSCOPY;  Surgeon: Daneil Dolin, MD;  [facility];  Service:  Endoscopy;  Laterality: N/A;  10:00 AM  . COLONOSCOPY WITH PROPOFOL N/A 10/07/2020   Procedure: COLONOSCOPY WITH PROPOFOL;  Surgeon: Daneil Dolin, MD;  [facility];  Service: Endoscopy;  Laterality: N/A;  9:45am  . JOINT REPLACEMENT  2006   left knee  . POLYPECTOMY  10/07/2020   Procedure: POLYPECTOMY;  Surgeon: Daneil Dolin, MD;  [facility];  Service: Endoscopy;;  . US ECHOCARDIOGRAPHY  08/21/2011   LA mild to mod. dilated,mild MR,TR   Current Outpatient Medications on File Prior to Visit  Medication Sig Dispense Refill  . cholecalciferol (VITAMIN D) 25 MCG (1000 UNIT) tablet Take 2,000 Units by mouth daily.    Marland Kitchen exemestane (AROMASIN) 25 MG tablet Take 1 tablet (25 mg total) by mouth daily after breakfast. 90 tablet 3  . ibuprofen (ADVIL,MOTRIN) 200 MG tablet Take 800 mg by mouth every 8 (eight) hours as needed for moderate pain.      No current facility-administered medications on file prior to visit.   No Known Allergies Social History   Socioeconomic History  . Marital status: Married    Spouse name: Not on file  . Number of children: 2  . Years of education: Not on file  . Highest education level: Not on file  Occupational History  . Not on  file  Tobacco Use  . Smoking status: Never Smoker  . Smokeless tobacco: Never Used  Vaping Use  . Vaping Use: Never used  Substance and Sexual Activity  . Alcohol use: No  . Drug use: No  . Sexual activity: Not on file  Other Topics Concern  . Not on file  Social History Narrative  . Not on file   Social Determinants of Health   Financial Resource Strain: Not on file  Food Insecurity: Not on file  Transportation Needs: Not on file  Physical Activity: Not on file  Stress: Not on file  Social Connections: Not on file  Intimate Partner Violence: Not on file     Review of Systems  All other systems reviewed and are negative.      Objective:   Physical Exam Vitals reviewed. Exam conducted  with a chaperone present.  Constitutional:      General: She is not in acute distress.    Appearance: Normal appearance. She is normal weight. She is not ill-appearing, toxic-appearing or diaphoretic.  HENT:     Head: Normocephalic and atraumatic.     Right Ear: Tympanic membrane, ear canal and external ear normal. There is no impacted cerumen.     Left Ear: Tympanic membrane, ear canal and external ear normal. There is no impacted cerumen.     Nose: Nose normal. No congestion or rhinorrhea.     Mouth/Throat:     Mouth: Mucous membranes are moist.     Pharynx: Oropharynx is clear. No oropharyngeal exudate or posterior oropharyngeal erythema.  Eyes:     General: No scleral icterus.       Right eye: No discharge.        Left eye: No discharge.     Extraocular Movements: Extraocular movements intact.     Conjunctiva/sclera: Conjunctivae normal.     Pupils: Pupils are equal, round, and reactive to light.  Neck:     Vascular: No carotid bruit.  Cardiovascular:     Rate and Rhythm: Normal rate and regular rhythm.     Pulses: Normal pulses.     Heart sounds: Normal heart sounds. No murmur heard. No friction rub. No gallop.   Pulmonary:     Effort: Pulmonary effort is normal. No respiratory distress.     Breath sounds: Normal breath sounds. No stridor. No wheezing, rhonchi or rales.  Chest:     Chest wall: No tenderness.  Abdominal:     General: Abdomen is flat. Bowel sounds are normal. There is no distension.     Palpations: Abdomen is soft. There is no mass.     Tenderness: There is no abdominal tenderness. There is no right CVA tenderness, left CVA tenderness, guarding or rebound.     Hernia: No hernia is present.  Genitourinary:    General: Normal vulva.     Exam position: Lithotomy position.     Labia:        Right: No rash.        Left: No rash.      Vagina: Normal. No erythema or lesions.     Cervix: No cervical motion tenderness, friability, lesion or erythema.     Uterus:  Normal.      Adnexa: Right adnexa normal and left adnexa normal.       Right: No mass.         Left: No mass.    Musculoskeletal:     Cervical back: Normal range of motion and neck supple.  No rigidity.     Right lower leg: Edema present.     Left lower leg: Edema present.  Lymphadenopathy:     Cervical: No cervical adenopathy.  Skin:    General: Skin is warm.     Coloration: Skin is not jaundiced or pale.     Findings: No bruising, erythema, lesion or rash.  Neurological:     General: No focal deficit present.     Mental Status: She is alert. Mental status is at baseline.     Cranial Nerves: No cranial nerve deficit.     Sensory: No sensory deficit.     Motor: No weakness.     Coordination: Coordination normal.     Gait: Gait normal.     Deep Tendon Reflexes: Reflexes normal.  Psychiatric:        Mood and Affect: Mood normal.        Behavior: Behavior normal.        Thought Content: Thought content normal.        Judgment: Judgment normal.           Assessment & Plan:  Pure hypercholesterolemia - Plan: BASIC METABOLIC PANEL WITH GFR, Lipid panel  Essential hypertension  Blood pressure today is excellent.  Recent renal function test and potassium were normal.  Liver function tests were normal.  I will check a fasting lipid panel.  Ideally I like to see her LDL cholesterol below 130.  I am also going to repeat a fasting blood sugar today to ensure that her glucose is not truly 111.  Otherwise I recommended sodium restriction as well as compression hose for leg swelling.

## 2021-04-29 ENCOUNTER — Ambulatory Visit
Admission: RE | Admit: 2021-04-29 | Discharge: 2021-04-29 | Disposition: A | Discharge status: Home / Self Care | Payer: Medicare PPO | Source: Ambulatory Visit | Attending: Nurse Practitioner | Admitting: Nurse Practitioner

## 2021-04-29 ENCOUNTER — Other Ambulatory Visit: Payer: Self-pay

## 2021-04-29 DIAGNOSIS — Z853 Personal history of malignant neoplasm of breast: Secondary | ICD-10-CM | POA: Diagnosis not present

## 2021-04-29 DIAGNOSIS — C50911 Malignant neoplasm of unspecified site of right female breast: Secondary | ICD-10-CM

## 2021-04-29 DIAGNOSIS — R922 Inconclusive mammogram: Secondary | ICD-10-CM | POA: Diagnosis not present

## 2021-05-02 ENCOUNTER — Telehealth: Payer: Self-pay | Admitting: Family Medicine

## 2021-05-02 NOTE — Telephone Encounter (Signed)
Left message for patient to call back and schedule Medicare Annual Wellness Visit (AWV) in office.   If not able to come in office, please offer to do virtually or by telephone.   No history of  AWV: Per [employer] eligible for AWVI as of  06/11/2019  Please schedule at anytime with [facility]-Nurse Health Advisor.  If any questions, please contact me at [phone]

## 2021-06-09 ENCOUNTER — Ambulatory Visit: Payer: Medicare PPO

## 2021-06-10 ENCOUNTER — Ambulatory Visit (INDEPENDENT_AMBULATORY_CARE_PROVIDER_SITE_OTHER): Payer: Medicare PPO

## 2021-06-10 ENCOUNTER — Other Ambulatory Visit: Payer: Self-pay

## 2021-06-10 VITALS — BP 118/70 | HR 82 | Temp 98.1°F | Ht 61.0 in | Wt 188.0 lb

## 2021-06-10 DIAGNOSIS — Z0000 Encounter for general adult medical examination without abnormal findings: Secondary | ICD-10-CM

## 2021-06-10 NOTE — Progress Notes (Signed)
Subjective:   [name] is a 70 y.o. female who presents for an Initial Medicare Annual Wellness Visit.  Review of Systems    N/A  Cardiac Risk Factors include: advanced age (>45men, >57 women);hypertension;dyslipidemia     Objective:    Today's Vitals   06/10/21 1112  BP: 118/70  Pulse: 82  Temp: 98.1 F (36.7 C)  TempSrc: Temporal  SpO2: 96%  Weight: 188 lb (85.3 kg)  Height: 5\' 1"  (1.549 m)   Body mass index is 35.52 kg/m.  Advanced Directives 06/10/2021 10/07/2020 06/24/2020 06/04/2020 01/01/2012  Does Patient Have a Medical Advance Directive? No No No No Patient does not have advance directive  Would patient like information on creating a medical advance directive? No - Patient declined No - Patient declined No - Patient declined No - Patient declined -  Pre-existing out of facility DNR order (yellow form or pink MOST form) - - - - No    Current Medications (verified) Outpatient Encounter Medications as of 06/10/2021  Medication Sig   cholecalciferol (VITAMIN D) 25 MCG (1000 UNIT) tablet Take 2,000 Units by mouth daily.   exemestane (AROMASIN) 25 MG tablet Take 1 tablet (25 mg total) by mouth daily after breakfast.   ibuprofen (ADVIL,MOTRIN) 200 MG tablet Take 800 mg by mouth every 8 (eight) hours as needed for moderate pain.    simvastatin (ZOCOR) 20 MG tablet Take 1 tablet (20 mg total) by mouth daily at 6 PM.   triamterene-hydrochlorothiazide (MAXZIDE-25) 37.5-25 MG tablet Take 1 tablet by mouth daily.   No facility-administered encounter medications on file as of 06/10/2021.    Allergies (verified) Patient has no known allergies.   History: Past Medical History:  Diagnosis Date   Arthritis    Family history of bone cancer    Family history of colon cancer    Family history of leukemia    Family history of lung cancer    Family history of melanoma    Family history of ovarian cancer    Family history of stomach cancer    Family history of uterine  cancer    History of kidney stones    Hyperlipemia    Hypertension    Invasive ductal carcinoma of breast ([place])    Kidney stones before 01-05-12   states she has passed stones 4 times/ [facility]   Lower extremity edema    Obesity    Past Surgical History:  Procedure Laterality Date   BREAST BIOPSY Left    BREAST LUMPECTOMY WITH RADIOACTIVE SEED AND SENTINEL LYMPH NODE BIOPSY Right 06/08/2020   Procedure: RIGHT BREAST LUMPECTOMY WITH RADIOACTIVE SEED AND SENTINEL LYMPH NODE MAPPING;  Surgeon: [name], MD;  Location: [facility];  Service: General;  Laterality: Right;  PEC BLOCK   CHOLECYSTECTOMY     COLONOSCOPY  01/01/2012   Procedure: COLONOSCOPY;  Surgeon: Daneil Dolin, MD;  [facility];  Service: Endoscopy;  Laterality: N/A;  10:00 AM   COLONOSCOPY WITH PROPOFOL N/A 10/07/2020   Procedure: COLONOSCOPY WITH PROPOFOL;  Surgeon: Daneil Dolin, MD;  [facility];  Service: Endoscopy;  Laterality: N/A;  9:45am   JOINT REPLACEMENT  2006   left knee   POLYPECTOMY  10/07/2020   Procedure: POLYPECTOMY;  Surgeon: Daneil Dolin, MD;  [facility];  Service: Endoscopy;;   US ECHOCARDIOGRAPHY  08/21/2011   LA mild to mod. dilated,mild MR,TR   Family History  Problem Relation Age of Onset   Hypertension  Mother    Colon cancer Mother 8   Hypertension Father    Heart attack Father    Melanoma Father        dx. in his early 59s (x3)   Uterine cancer Maternal Aunt        dx. >50   Cancer Daughter 94       ovarian (dysgerminoma)   Leukemia Paternal Uncle        dx. in his late 38s   Stomach cancer Maternal Grandmother        dx. in her 32s   Bone cancer Maternal Aunt        dx. in her early 46s   Lung cancer Maternal Aunt    Testicular cancer Cousin        dx. in his late 30s/early 56s (maternal cousin)   Anesthesia problems Neg Hx    Social History   Socioeconomic History   Marital status: Married    Spouse name: Not on file   Number of  children: 2   Years of education: Not on file   Highest education level: Not on file  Occupational History   Not on file  Tobacco Use   Smoking status: Never   Smokeless tobacco: Never  Vaping Use   Vaping Use: Never used  Substance and Sexual Activity   Alcohol use: No   Drug use: No   Sexual activity: Not on file  Other Topics Concern   Not on file  Social History Narrative   Not on file   Social Determinants of Health   Financial Resource Strain: Low Risk    Difficulty of Paying Living Expenses: Not hard at all  Food Insecurity: No Food Insecurity   Worried About Charity fundraiser in the Last Year: Never true   [place] in the Last Year: Never true  Transportation Needs: No Transportation Needs   Lack of Transportation (Medical): No   Lack of Transportation (Non-Medical): No  Physical Activity: Inactive   Days of Exercise per Week: 0 days   Minutes of Exercise per Session: 0 min  Stress: No Stress Concern Present   Feeling of Stress : Not at all  Social Connections: Moderately Isolated   Frequency of Communication with Friends and Family: Twice a week   Frequency of Social Gatherings with Friends and Family: More than three times a week   Attends Religious Services: Never   Marine scientist or Organizations: No   Attends Music therapist: Never   Marital Status: Married    Tobacco Counseling Counseling given: Not Answered   Clinical Intake:  Pre-visit preparation completed: Yes  Pain : No/denies pain     Diabetes: No  How often do you need to have someone help you when you read instructions, pamphlets, or other written materials from your doctor or pharmacy?: 1 - Never  Diabetic?No   Interpreter Needed?: No  Information entered by :: [place] of Daily Living In your present state of health, do you have any difficulty performing the following activities: 06/10/2021  Hearing? N  Vision? N  Difficulty  concentrating or making decisions? N  Walking or climbing stairs? N  Dressing or bathing? N  Doing errands, shopping? N  Preparing Food and eating ? N  Using the Toilet? N  In the past six months, have you accidently leaked urine? Y  Comment stress incontience  Do you have problems with loss of bowel control? N  Managing your Medications? N  Managing your Finances? N  Housekeeping or managing your Housekeeping? N  Some recent data might be hidden    Patient Care Team: [name], MD as PCP - General (Family Medicine) [name], RN as Oncology Nurse Navigator [name], RN as Oncology Nurse Navigator [name], MD as Consulting Physician (General Surgery) [name], MD as Consulting Physician (Hematology) [name], MD as Consulting Physician (Radiation Oncology) [name], NP as Nurse Practitioner (Nurse Practitioner)  Indicate any recent Medical Services you may have received from other than [facility] providers in the past year (date may be approximate).     Assessment:   This is a routine wellness examination for [place].  Hearing/Vision screen Vision Screening - Comments:: Patient states has not had an eye exam in several years. Wears otc reading glasses   Dietary issues and exercise activities discussed: Current Exercise Habits: The patient does not participate in regular exercise at present, Exercise limited by: orthopedic condition(s)   Goals Addressed             This Visit's Progress    Patient Stated       I am reducing bread, and potatoes in my diet       Weight (lb) < 160 lb (72.6 kg)   188 lb (85.3 kg)      Depression Screen PHQ 2/9 Scores 06/10/2021 04/02/2020 12/20/2017 06/11/2017  PHQ - 2 Score 0 0 0 0  PHQ- 9 Score - - - 0    Fall Risk Fall Risk  06/10/2021 04/02/2020 12/20/2017 06/11/2017  Falls in the past year? 0 0 No No  Number falls in past yr: 0 - - -  Injury with Fall? 0 - - -  Risk for fall due to : No Fall Risks -  - -  Follow up Falls evaluation completed;Falls prevention discussed Falls evaluation completed - -    FALL RISK PREVENTION PERTAINING TO THE HOME:  Any stairs in or around the home? Yes  If so, are there any without handrails? No  Home free of loose throw rugs in walkways, pet beds, electrical cords, etc? Yes  Adequate lighting in your home to reduce risk of falls? Yes   ASSISTIVE DEVICES UTILIZED TO PREVENT FALLS:  Life alert? No  Use of a cane, walker or w/c? No  Grab bars in the bathroom? Yes  Shower chair or bench in shower? No  Elevated toilet seat or a handicapped toilet? No   TIMED UP AND GO:  Was the test performed? Yes .  Length of time to ambulate 10 feet: 3 sec.   Gait steady and fast without use of assistive device  Cognitive Function:  Normal cognitive status assessed by direct observation by this Nurse Health Advisor. No abnormalities found.        Immunizations Immunization History  Administered Date(s) Administered   Pneumococcal Conjugate-13 06/11/2017   Pneumococcal Polysaccharide-23 04/02/2020   Tdap 06/11/2017    TDAP status: Up to date  Flu Vaccine status: Declined, Education has been provided regarding the importance of this vaccine but patient still declined. Advised may receive this vaccine at local pharmacy or Health Dept. Aware to provide a copy of the vaccination record if obtained from local pharmacy or Health Dept. Verbalized acceptance and understanding.  Pneumococcal vaccine status: Up to date  Covid-19 vaccine status: Declined, Education has been provided regarding the importance of this vaccine but patient still declined. Advised may receive this vaccine  at local pharmacy or Health Dept.or vaccine clinic. Aware to provide a copy of the vaccination record if obtained from local pharmacy or Health Dept. Verbalized acceptance and understanding.  Qualifies for Shingles Vaccine? Yes   Zostavax completed No   Shingrix Completed?: No.     Education has been provided regarding the importance of this vaccine. Patient has been advised to call insurance company to determine out of pocket expense if they have not yet received this vaccine. Advised may also receive vaccine at local pharmacy or Health Dept. Verbalized acceptance and understanding.  Screening Tests Health Maintenance  Topic Date Due   COVID-19 Vaccine (1) Never done   Hepatitis C Screening  Never done   Zoster Vaccines- Shingrix (1 of 2) Never done   INFLUENZA VACCINE  07/11/2021   MAMMOGRAM  04/29/2022   TETANUS/TDAP  06/12/2027   COLONOSCOPY (Pts 45-7yrs Insurance coverage will need to be confirmed)  10/07/2030   DEXA SCAN  Completed   PNA vac Low Risk Adult  Completed   HPV VACCINES  Aged Out    Health Maintenance  Health Maintenance Due  Topic Date Due   COVID-19 Vaccine (1) Never done   Hepatitis C Screening  Never done   Zoster Vaccines- Shingrix (1 of 2) Never done    Colorectal cancer screening: Type of screening: Colonoscopy. Completed 10/07/2020. Repeat every 10 years  Mammogram status: Completed 04/29/2021. Repeat every year  Bone Density status: Completed 09/07/2020. Results reflect: Bone density results: NORMAL. Repeat every 0 years.  Lung Cancer Screening: (Low Dose CT Chest recommended if Age 98-80 years, 30 pack-year currently smoking OR have quit w/in 15years.) does not qualify.   Lung Cancer Screening Referral: N/A   Additional Screening:  Hepatitis C Screening: does qualify;   Vision Screening: Recommended annual ophthalmology exams for early detection of glaucoma and other disorders of the eye. Is the patient up to date with their annual eye exam?  No  Who is the provider or what is the name of the office in which the patient attends annual eye exams? N/a  If pt is not established with a provider, would they like to be referred to a provider to establish care? No .   Dental Screening: Recommended annual dental exams for proper  oral hygiene  Community Resource Referral / Chronic Care Management: CRR required this visit?  No   CCM required this visit?  No      Plan:     I have personally reviewed and noted the following in the patient's chart:   Medical and social history Use of alcohol, tobacco or illicit drugs  Current medications and supplements including opioid prescriptions. Patient is not currently taking opioid prescriptions. Functional ability and status Nutritional status Physical activity Advanced directives List of other physicians Hospitalizations, surgeries, and ER visits in previous 12 months Vitals Screenings to include cognitive, depression, and falls Referrals and appointments  In addition, I have reviewed and discussed with patient certain preventive protocols, quality metrics, and best practice recommendations. A written personalized care plan for preventive services as well as general preventive health recommendations were provided to patient.     Ofilia Neas, LPN   07/19/8915   Nurse Notes: None

## 2021-06-10 NOTE — Patient Instructions (Signed)
Ms. Catano , Thank you for taking time to come for your Medicare Wellness Visit. I appreciate your ongoing commitment to your health goals. Please review the following plan we discussed and let me know if I can assist you in the future.   Screening recommendations/referrals: Colonoscopy: Up to date, next due 10/07/2030 Mammogram: Up to date, next due 04/29/2022 Bone Density: No longer required  Recommended yearly ophthalmology/optometry visit for glaucoma screening and checkup Recommended yearly dental visit for hygiene and checkup  Vaccinations: Influenza vaccine: Patient declined  Pneumococcal vaccine: Completed series  Tdap vaccine: Up to date, next due 06/12/2027 Shingles vaccine: Currently due for Shingrix, if you would like to receive we recommend that you do so at your local pharmacy    Advanced directives: Advance directive discussed with you today. Even though you declined this today please call our office should you change your mind and we can give you the proper paperwork for you to fill out.   Conditions/risks identified: None   Next appointment: None    Preventive Care 65 Years and Older, Female Preventive care refers to lifestyle choices and visits with your health care provider that can promote health and wellness. What does preventive care include? A yearly physical exam. This is also called an annual well check. Dental exams once or twice a year. Routine eye exams. Ask your health care provider how often you should have your eyes checked. Personal lifestyle choices, including: Daily care of your teeth and gums. Regular physical activity. Eating a healthy diet. Avoiding tobacco and drug use. Limiting alcohol use. Practicing safe sex. Taking low-dose aspirin every day. Taking vitamin and mineral supplements as recommended by your health care provider. What happens during an annual well check? The services and screenings done by your health care provider during  your annual well check will depend on your age, overall health, lifestyle risk factors, and family history of disease. Counseling  Your health care provider may ask you questions about your: Alcohol use. Tobacco use. Drug use. Emotional well-being. Home and relationship well-being. Sexual activity. Eating habits. History of falls. Memory and ability to understand (cognition). Work and work Statistician. Reproductive health. Screening  You may have the following tests or measurements: Height, weight, and BMI. Blood pressure. Lipid and cholesterol levels. These may be checked every 5 years, or more frequently if you are over 2 years old. Skin check. Lung cancer screening. You may have this screening every year starting at age 39 if you have a 30-pack-year history of smoking and currently smoke or have quit within the past 15 years. Fecal occult blood test (FOBT) of the stool. You may have this test every year starting at age 61. Flexible sigmoidoscopy or colonoscopy. You may have a sigmoidoscopy every 5 years or a colonoscopy every 10 years starting at age 44. Hepatitis C blood test. Hepatitis B blood test. Sexually transmitted disease (STD) testing. Diabetes screening. This is done by checking your blood sugar (glucose) after you have not eaten for a while (fasting). You may have this done every 1-3 years. Bone density scan. This is done to screen for osteoporosis. You may have this done starting at age 12. Mammogram. This may be done every 1-2 years. Talk to your health care provider about how often you should have regular mammograms. Talk with your health care provider about your test results, treatment options, and if necessary, the need for more tests. Vaccines  Your health care provider may recommend certain vaccines, such as: Influenza vaccine.  This is recommended every year. Tetanus, diphtheria, and acellular pertussis (Tdap, Td) vaccine. You may need a Td booster every 10  years. Zoster vaccine. You may need this after age 56. Pneumococcal 13-valent conjugate (PCV13) vaccine. One dose is recommended after age 41. Pneumococcal polysaccharide (PPSV23) vaccine. One dose is recommended after age 68. Talk to your health care provider about which screenings and vaccines you need and how often you need them. This information is not intended to replace advice given to you by your health care provider. Make sure you discuss any questions you have with your health care provider. Document Released: 12/24/2015 Document Revised: 08/16/2016 Document Reviewed: 09/28/2015 Elsevier Interactive Patient Education  2017 [employer] Prevention in the Home Falls can cause injuries. They can happen to people of all ages. There are many things you can do to make your home safe and to help prevent falls. What can I do on the outside of my home? Regularly fix the edges of walkways and driveways and fix any cracks. Remove anything that might make you trip as you walk through a door, such as a raised step or threshold. Trim any bushes or trees on the path to your home. Use bright outdoor lighting. Clear any walking paths of anything that might make someone trip, such as rocks or tools. Regularly check to see if handrails are loose or broken. Make sure that both sides of any steps have handrails. Any raised decks and porches should have guardrails on the edges. Have any leaves, snow, or ice cleared regularly. Use sand or salt on walking paths during winter. Clean up any spills in your garage right away. This includes oil or grease spills. What can I do in the bathroom? Use night lights. Install grab bars by the toilet and in the tub and shower. Do not use towel bars as grab bars. Use non-skid mats or decals in the tub or shower. If you need to sit down in the shower, use a plastic, non-slip stool. Keep the floor dry. Clean up any water that spills on the floor as soon as it  happens. Remove soap buildup in the tub or shower regularly. Attach bath mats securely with double-sided non-slip rug tape. Do not have throw rugs and other things on the floor that can make you trip. What can I do in the bedroom? Use night lights. Make sure that you have a light by your bed that is easy to reach. Do not use any sheets or blankets that are too big for your bed. They should not hang down onto the floor. Have a firm chair that has side arms. You can use this for support while you get dressed. Do not have throw rugs and other things on the floor that can make you trip. What can I do in the kitchen? Clean up any spills right away. Avoid walking on wet floors. Keep items that you use a lot in easy-to-reach places. If you need to reach something above you, use a strong step stool that has a grab bar. Keep electrical cords out of the way. Do not use floor polish or wax that makes floors slippery. If you must use wax, use non-skid floor wax. Do not have throw rugs and other things on the floor that can make you trip. What can I do with my stairs? Do not leave any items on the stairs. Make sure that there are handrails on both sides of the stairs and use them. Fix handrails  that are broken or loose. Make sure that handrails are as long as the stairways. Check any carpeting to make sure that it is firmly attached to the stairs. Fix any carpet that is loose or worn. Avoid having throw rugs at the top or bottom of the stairs. If you do have throw rugs, attach them to the floor with carpet tape. Make sure that you have a light switch at the top of the stairs and the bottom of the stairs. If you do not have them, ask someone to add them for you. What else can I do to help prevent falls? Wear shoes that: Do not have high heels. Have rubber bottoms. Are comfortable and fit you well. Are closed at the toe. Do not wear sandals. If you use a stepladder: Make sure that it is fully opened.  Do not climb a closed stepladder. Make sure that both sides of the stepladder are locked into place. Ask someone to hold it for you, if possible. Clearly mark and make sure that you can see: Any grab bars or handrails. First and last steps. Where the edge of each step is. Use tools that help you move around (mobility aids) if they are needed. These include: Canes. Walkers. Scooters. Crutches. Turn on the lights when you go into a dark area. Replace any light bulbs as soon as they burn out. Set up your furniture so you have a clear path. Avoid moving your furniture around. If any of your floors are uneven, fix them. If there are any pets around you, be aware of where they are. Review your medicines with your doctor. Some medicines can make you feel dizzy. This can increase your chance of falling. Ask your doctor what other things that you can do to help prevent falls. This information is not intended to replace advice given to you by your health care provider. Make sure you discuss any questions you have with your health care provider. Document Released: 09/23/2009 Document Revised: 05/04/2016 Document Reviewed: 01/01/2015 Elsevier Interactive Patient Education  2017 [employer].

## 2021-06-20 ENCOUNTER — Encounter (HOSPITAL_COMMUNITY): Payer: Self-pay

## 2021-06-23 ENCOUNTER — Inpatient Hospital Stay: Payer: Medicare PPO | Attending: Hematology

## 2021-06-23 ENCOUNTER — Encounter: Payer: Self-pay | Admitting: Hematology

## 2021-06-23 ENCOUNTER — Inpatient Hospital Stay (HOSPITAL_BASED_OUTPATIENT_CLINIC_OR_DEPARTMENT_OTHER): Payer: Medicare PPO | Admitting: Hematology

## 2021-06-23 ENCOUNTER — Other Ambulatory Visit: Payer: Self-pay

## 2021-06-23 VITALS — BP 137/77 | HR 64 | Temp 98.4°F | Resp 18 | Ht 61.0 in | Wt 187.9 lb

## 2021-06-23 DIAGNOSIS — C50911 Malignant neoplasm of unspecified site of right female breast: Secondary | ICD-10-CM

## 2021-06-23 DIAGNOSIS — Z79811 Long term (current) use of aromatase inhibitors: Secondary | ICD-10-CM | POA: Insufficient documentation

## 2021-06-23 DIAGNOSIS — M1990 Unspecified osteoarthritis, unspecified site: Secondary | ICD-10-CM | POA: Insufficient documentation

## 2021-06-23 DIAGNOSIS — C50811 Malignant neoplasm of overlapping sites of right female breast: Secondary | ICD-10-CM | POA: Diagnosis not present

## 2021-06-23 DIAGNOSIS — Z170 Estrogen receptor positive status [ER+]: Secondary | ICD-10-CM | POA: Diagnosis not present

## 2021-06-23 DIAGNOSIS — G4700 Insomnia, unspecified: Secondary | ICD-10-CM | POA: Diagnosis not present

## 2021-06-23 LAB — CBC WITH DIFFERENTIAL (CANCER CENTER ONLY)
Abs Immature Granulocytes: 0.03 10*3/uL (ref 0.00–0.07)
Basophils Absolute: 0 10*3/uL (ref 0.0–0.1)
Basophils Relative: 1 %
Eosinophils Absolute: 0.2 10*3/uL (ref 0.0–0.5)
Eosinophils Relative: 2 %
HCT: 38.9 % (ref 36.0–46.0)
Hemoglobin: 12.9 g/dL (ref 12.0–15.0)
Immature Granulocytes: 0 %
Lymphocytes Relative: 32 %
Lymphs Abs: 2.3 10*3/uL (ref 0.7–4.0)
MCH: 28 pg (ref 26.0–34.0)
MCHC: 33.2 g/dL (ref 30.0–36.0)
MCV: 84.4 fL (ref 80.0–100.0)
Monocytes Absolute: 0.4 10*3/uL (ref 0.1–1.0)
Monocytes Relative: 6 %
Neutro Abs: 4.3 10*3/uL (ref 1.7–7.7)
Neutrophils Relative %: 59 %
Platelet Count: 192 10*3/uL (ref 150–400)
RBC: 4.61 MIL/uL (ref 3.87–5.11)
RDW: 13.6 % (ref 11.5–15.5)
WBC Count: 7.3 10*3/uL (ref 4.0–10.5)
nRBC: 0 % (ref 0.0–0.2)

## 2021-06-23 LAB — CMP (CANCER CENTER ONLY)
ALT: 21 U/L (ref 0–44)
AST: 21 U/L (ref 15–41)
Albumin: 4.1 g/dL (ref 3.5–5.0)
Alkaline Phosphatase: 73 U/L (ref 38–126)
Anion gap: 10 (ref 5–15)
BUN: 17 mg/dL (ref 8–23)
CO2: 27 mmol/L (ref 22–32)
Calcium: 10.1 mg/dL (ref 8.9–10.3)
Chloride: 104 mmol/L (ref 98–111)
Creatinine: 0.83 mg/dL (ref 0.44–1.00)
GFR, Estimated: >60 mL/min (ref >60)
Glucose, Bld: 84 mg/dL (ref 70–99)
Potassium: 4 mmol/L (ref 3.5–5.1)
Sodium: 141 mmol/L (ref 135–145)
Total Bilirubin: 1.6 mg/dL — ABNORMAL HIGH (ref 0.3–1.2)
Total Protein: 7.4 g/dL (ref 6.5–8.1)

## 2021-06-23 NOTE — Progress Notes (Signed)
[place]   [phone] [phone]   Clinic Follow up Note   Patient Care Team: Shannon Frizzle, MD as PCP - General (Family Medicine) [name], RN as Oncology Nurse Navigator Shannon Kaufmann, RN as Oncology Nurse Navigator Shannon Luna, MD as Consulting Physician (General Surgery) [name], MD as Consulting Physician (Hematology) Shannon Pray, MD as Consulting Physician (Radiation Oncology) Shannon Feeling, NP as Nurse Practitioner (Nurse Practitioner)  Date of Service:  06/23/2021  CHIEF COMPLAINT: f/u of right breast cancer  SUMMARY OF ONCOLOGIC HISTORY: Oncology History Overview Note  Cancer Staging malignant neoplasm right central breast Staging form: Breast, AJCC 8th Edition - Clinical stage from 05/04/2020: Stage IA (cT1, cN0, cM0, G2, ER+, PR+, HER2-) - Signed by Shannon Feeling, NP on 05/24/2020 - Pathologic stage from 06/08/2020: Stage IA (pT1c, pN0, cM0, G2, ER+, PR+, HER2-, Oncotype DX score: 24) - Signed by [name], MD on 08/22/2020    malignant neoplasm right central breast  04/27/2020 Breast US   FINDINGS: Mammogram: Spot compression tomosynthesis views of the right breast were performed. There is persistence of architectural distortion in the lower central to slightly outer right breast. The area of distortion spans approximately 0.9 cm. Ultrasound: Targeted ultrasound is performed in the right breast at 6 o'clock 1 cm from the nipple demonstrating an irregular hypoechoic mass measuring 0.8 x 0.6 x 0.6 cm. The surrounding tissues are distorted. No internal blood flow identified. Targeted ultrasound of the right axilla demonstrates normal-appearing lymph nodes.   IMPRESSION: Right breast mass at 6 o'clock measuring 0.8 cm is suspicious and likely corresponds to the distortion seen mammographically.   05/04/2020 Cancer Staging   Staging form: Breast, AJCC 8th Edition - Clinical stage from 05/04/2020: Stage  IA (cT1, cN0, cM0, G2, ER+, PR+, HER2-) - Signed by Shannon Feeling, NP on 05/24/2020    05/04/2020 Initial Biopsy   FINAL MICROSCOPIC DIAGNOSIS:  A. BREAST, RIGHT/6:00, BIOPSY:  - Invasive ductal carcinoma.  - Ductal carcinoma in situ.  - Complex sclerosing lesion. COMMENT:  The carcinoma appears grade 2.  The greatest linear extent of tumor in any one core is 7 mm.   Immunohistochemical and morphometric analysis performed manually  The tumor cells are NEGATIVE for Her2 (1+).  Estrogen Receptor:       POSITIVE, 90% STRONG STAINING  Progesterone Receptor:   POSITIVE, 90% STRONG STAINING  Proliferation Marker Ki-67:   10%    05/06/2020 Initial Diagnosis   malignant neoplasm right central breast    06/02/2020 Genetic Testing   Negative genetic testing:  No pathogenic variants detected on the [facility] Breast Cancer STAT panel + Common Hereditary Cancers Panel. A variant of uncertain significance (VUS) was detected in the AXIN2 gene called c.1235A>C. The report date is 06/02/2020.  The Breast Cancer STAT Panel offered by [facility] includes sequencing and deletion/duplication analysis for the following 9 genes:  ATM, BRCA1, BRCA2, CDH1, CHEK2, PALB2, PTEN, STK11 and TP53. The Common Hereditary Cancers Panel offered by [facility] includes sequencing and/or deletion duplication testing of the following 48 genes: APC, ATM, AXIN2, BARD1, BMPR1A, BRCA1, BRCA2, BRIP1, CDH1, CDK4, CDKN2A (p14ARF), CDKN2A (p16INK4a), CHEK2, CTNNA1, DICER1, EPCAM (Deletion/duplication testing only), GREM1 (promoter region deletion/duplication testing only), KIT, MEN1, MLH1, MSH2, MSH3, MSH6, MUTYH, NBN, NF1, NHTL1, PALB2, PDGFRA, PMS2, POLD1, POLE, PTEN, RAD50, RAD51C, RAD51D, RNF43, SDHB, SDHC, SDHD, SMAD4, SMARCA4. STK11, TP53, TSC1, TSC2, and VHL.  The following genes were evaluated for sequence changes only: SDHA and  HOXB13 c.251G>A variant only.   06/08/2020 Cancer Staging   Staging form: Breast, AJCC 8th Edition -  Pathologic stage from 06/08/2020: Stage IA (pT1c, pN0, cM0, G2, ER+, PR+, HER2-, Oncotype DX score: 24) - Signed by [name], MD on 08/22/2020    06/08/2020 Surgery   RIGHT BREAST LUMPECTOMY WITH RADIOACTIVE SEED AND SENTINEL LYMPH NODE MAPPING with Dr Brantley Stage    06/08/2020 Pathology Results   FINAL MICROSCOPIC DIAGNOSIS:   A. BREAST, RIGHT, LUMPECTOMY:  - Invasive ductal carcinoma, grade 2, spanning 1.1 cm.  - High grade ductal carcinoma in situ with necrosis.  - Biopsy site.  - Resection margins negative for invasive carcinoma.  - In situ carcinoma is <0.1 cm to the posterior margin broadly, see  comment.  - See oncology table.   B. LYMPH NODE, RIGHT AXILLARY, SENTINEL, EXCISION:  - One of one lymph nodes negative for carcinoma (0/1).    06/08/2020 Oncotype testing   Recurrence score 24  Distant recurrence risk at 9 years with AI or Tamoxifen alone is 10% There is less than 1% benefit of adjuvant chemotherapy.    07/26/2020 - 08/25/2020 Radiation Therapy   Adjuvant Radiation with Dr [name] 07/26/20-08/25/20   09/2020 -  Anti-estrogen oral therapy   Exemestane 74m once daily starting in 09/2020   11/22/2020 Survivorship   SCP delivered by [name] NP       CURRENT THERAPY:  Exemestane 261monce daily starting in 09/2020  INTERVAL HISTORY:  [name] here for a follow up of breast cancer. She was last seen by me on 02/21/21. She presents to the clinic alone. She notes some joint pain in her shoulders. She denies prior physical therapy (following surgery), stretching, or exercising. She notes she has been trying to lose weight. She has lost about 8 lbs since her last visit.   All other systems were reviewed with the patient and are negative.  MEDICAL HISTORY:  Past Medical History:  Diagnosis Date   Arthritis    Family history of bone cancer    Family history of colon cancer    Family history of leukemia    Family history of lung cancer    Family history  of melanoma    Family history of ovarian cancer    Family history of stomach cancer    Family history of uterine cancer    History of kidney stones    Hyperlipemia    Hypertension    Invasive ductal carcinoma of breast ([facility]   Kidney stones before 1200/08/23 states she has passed stones 4 times/ tsf   Lower extremity edema    Obesity     SURGICAL HISTORY: Past Surgical History:  Procedure Laterality Date   BREAST BIOPSY Left    BREAST LUMPECTOMY WITH RADIOACTIVE SEED AND SENTINEL LYMPH NODE BIOPSY Right 06/08/2020   Procedure: RIGHT BREAST LUMPECTOMY WITH RADIOACTIVE SEED AND SENTINEL LYMPH NODE MAPPING;  Surgeon: CoErroll LunaMD;  [facility] Service: General;  Laterality: Right;  PEC BLOCK   CHOLECYSTECTOMY     COLONOSCOPY  01/01/2012   Procedure: COLONOSCOPY;  Surgeon: RoDaneil DolinMD;  [facility];  Service: Endoscopy;  Laterality: N/A;  10:00 AM   COLONOSCOPY WITH PROPOFOL N/A 10/07/2020   Procedure: COLONOSCOPY WITH PROPOFOL;  Surgeon: RoDaneil DolinMD;  [facility];  Service: Endoscopy;  Laterality: N/A;  9:45am   JOINT REPLACEMENT  2006   left knee   POLYPECTOMY  10/07/2020   Procedure: POLYPECTOMY;  Surgeon: Daneil Dolin, MD;  [facility];  Service: Endoscopy;;   US ECHOCARDIOGRAPHY  08/21/2011   LA mild to mod. dilated,mild MR,TR    I have reviewed the social history and family history with the patient and they are unchanged from previous note.  ALLERGIES:  has No Known Allergies.  MEDICATIONS:  Current Outpatient Medications  Medication Sig Dispense Refill   cholecalciferol (VITAMIN D) 25 MCG (1000 UNIT) tablet Take 2,000 Units by mouth daily.     exemestane (AROMASIN) 25 MG tablet Take 1 tablet (25 mg total) by mouth daily after breakfast. 90 tablet 3   ibuprofen (ADVIL,MOTRIN) 200 MG tablet Take 800 mg by mouth every 8 (eight) hours as needed for moderate pain.      simvastatin (ZOCOR) 20 MG tablet Take 1  tablet (20 mg total) by mouth daily at 6 PM. 90 tablet 3   triamterene-hydrochlorothiazide (MAXZIDE-25) 37.5-25 MG tablet Take 1 tablet by mouth daily. 90 tablet 3   No current facility-administered medications for this visit.    PHYSICAL EXAMINATION: ECOG PERFORMANCE STATUS: 0 - Asymptomatic  Vitals:   06/23/21 0856  BP: 137/77  Pulse: 64  Resp: 18  Temp: 98.4 F (36.9 C)  SpO2: 100%   Filed Weights   06/23/21 0856  Weight: 187 lb 14.4 oz (85.2 kg)    GENERAL:alert, no distress and comfortable SKIN: skin color, texture, turgor are normal, no rashes or significant lesions EYES: normal, Conjunctiva are pink and non-injected, sclera clear NECK: supple, thyroid normal size, non-tender, without nodularity LYMPH:  no palpable lymphadenopathy in the cervical, axillary LUNGS: clear to auscultation and percussion with normal breathing effort HEART: regular rate & rhythm and no murmurs and no lower extremity edema, mild ankle swelling ABDOMEN: abdomen soft, and normal bowel sounds; mild tenderness over stomach Musculoskeletal: no cyanosis of digits and no clubbing  NEURO: alert & oriented x 3 with fluent speech, no focal motor/sensory deficits BREAST: No palpable mass, nodules or adenopathy bilaterally. Breast exam benign.  LABORATORY DATA:  I have reviewed the data as listed CBC Latest Ref Rng & Units 06/23/2021 02/21/2021 06/04/2020  WBC 4.0 - 10.5 K/uL 7.3 8.9 8.8  Hemoglobin 12.0 - 15.0 g/dL 12.9 13.3 13.0  Hematocrit 36.0 - 46.0 % 38.9 40.7 41.3  Platelets 150 - 400 K/uL 192 206 221     CMP Latest Ref Rng & Units 03/10/2021 02/21/2021 10/06/2020  Glucose 65 - 99 mg/dL 85 111(H) 101(H)  BUN 7 - 25 mg/dL '16 18 17  ' Creatinine 0.50 - 0.99 mg/dL 0.90 0.94 0.79  Sodium 135 - 146 mmol/L 139 139 137  Potassium 3.5 - 5.3 mmol/L 4.4 3.9 4.1  Chloride 98 - 110 mmol/L 101 103 101  CO2 20 - 32 mmol/L '27 28 26  ' Calcium 8.6 - 10.4 mg/dL 10.7(H) 10.0 9.8  Total Protein 6.5 - 8.1 g/dL -  7.5 -  Total Bilirubin 0.3 - 1.2 mg/dL - 1.4(H) -  Alkaline Phos 38 - 126 U/L - 68 -  AST 15 - 41 U/L - 18 -  ALT 0 - 44 U/L - 20 -      RADIOGRAPHIC STUDIES: I have personally reviewed the radiological images as listed and agreed with the findings in the report. No results found.   ASSESSMENT & PLAN:  HAANI BAKULA is a 70 y.o. female with   1. Invasive ductal carcinoma and DCIS of the central right breast, ER+/PR+/HER2-, Grade 2, pT1cN0M0,  Oncotype RS 24 -She was diagnosed in 04/2020 with 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 -She underwent right breast lumpectomy and SLNB with Dr Brantley Stage on 06/08/20. Her RS was 24. I did not recommend adjuvant chemotherapy. She is s/p Adjuvant Radiation. -I started her on antiestrogen therapy with Exemestane in 09/2020. Tolerating well with mild joint stiffness and fatigue. She has noted some new shoulder pain. It's tolerable overall. I encouraged her to exercise. -Most recent mammogram 04/29/21 was negative, I reviewed with pt  -She is clinically doing well. Lab reviewed, her CBC is WNL. CMP is pending, I will call her with any abnormal results. Her physical exam was unremarkable. There is no clinical concern for recurrence. -Continue surveillance. Next mammogram due 04/2022.  -f/u in 6 months   2. Genetic Testing negative for pathogenetic mutations.   3. Bone Health -Her 06/2017 DEXA was normal. Her 09/07/20 DEXA was normal with T-score -0.7 at right femur neck.  -Given Exemestane can weaken her bone, will repeat DEXA every 2 years.  -She is taking vitamin D   4. Arthritis, insomnia  -She has chronic arthritis in her knees, hips and back. -she has had a right knee replacement in the past. -Arthritis stable on Exemestane mostly.     PLAN: -Continue Exemestane -Lab and f/u in 6 months   No problem-specific Assessment & Plan notes found for this encounter.   No orders of the defined types were placed in this  encounter.  All questions were answered. The patient knows to call the clinic with any problems, questions or concerns. No barriers to learning was detected. The total time spent in the appointment was 30 minutes.     [name], MD 06/23/2021   I, Wilburn Mylar, am acting as scribe for [name], MD.   I have reviewed the above documentation for accuracy and completeness, and I agree with the above.

## 2021-11-17 ENCOUNTER — Other Ambulatory Visit: Payer: Self-pay | Admitting: Nurse Practitioner

## 2021-12-13 DIAGNOSIS — M1711 Unilateral primary osteoarthritis, right knee: Secondary | ICD-10-CM | POA: Diagnosis not present

## 2021-12-14 DIAGNOSIS — M25661 Stiffness of right knee, not elsewhere classified: Secondary | ICD-10-CM | POA: Diagnosis not present

## 2021-12-14 DIAGNOSIS — M25561 Pain in right knee: Secondary | ICD-10-CM | POA: Diagnosis not present

## 2021-12-14 DIAGNOSIS — M1711 Unilateral primary osteoarthritis, right knee: Secondary | ICD-10-CM | POA: Diagnosis not present

## 2021-12-14 DIAGNOSIS — M6281 Muscle weakness (generalized): Secondary | ICD-10-CM | POA: Diagnosis not present

## 2021-12-19 ENCOUNTER — Ambulatory Visit: Payer: Medicare PPO | Admitting: Family Medicine

## 2021-12-19 ENCOUNTER — Other Ambulatory Visit: Payer: Self-pay

## 2021-12-19 ENCOUNTER — Encounter: Payer: Self-pay | Admitting: Family Medicine

## 2021-12-19 VITALS — BP 138/88 | HR 86 | Temp 98.0°F | Resp 18 | Ht 61.0 in | Wt 188.0 lb

## 2021-12-19 DIAGNOSIS — I10 Essential (primary) hypertension: Secondary | ICD-10-CM

## 2021-12-19 DIAGNOSIS — R002 Palpitations: Secondary | ICD-10-CM

## 2021-12-19 NOTE — Progress Notes (Signed)
Subjective:    Patient ID: [name], female    DOB: 30-Aug-1951, 71 y.o.   MRN: [id number]  HPI Patient is a very pleasant 71 year old Caucasian female who presents today for preoperative surgical clearance.  She is scheduled to have a right total knee replacement.  She has a past medical history of hypertension, hyperlipidemia, and breast cancer.  Her blood pressure today is well controlled.  She denies any angina.  She denies any dyspnea on exertion, orthopnea, or paroxysmal nocturnal dyspnea.  She denies any bleeding.  She denies any melena or hematochezia.  She denies any syncope or palpitations.  She does have history of "an irregular heartbeat".  I can auscultate this on exam today.  However I suspect that his PVCs and PACs.  Today on examination she does have a PAC that is caught on EKG.  Otherwise her EKG shows normal sinus rhythm with normal intervals and a normal axis.  There is no evidence of ischemia or infarction. Past Medical History:  Diagnosis Date   Arthritis    Family history of bone cancer    Family history of colon cancer    Family history of leukemia    Family history of lung cancer    Family history of melanoma    Family history of ovarian cancer    Family history of stomach cancer    Family history of uterine cancer    History of kidney stones    Hyperlipemia    Hypertension    Invasive ductal carcinoma of breast ([place])    Kidney stones before 12-17-11   states she has passed stones 4 times/ tsf   Lower extremity edema    Obesity    Past Surgical History:  Procedure Laterality Date   BREAST BIOPSY Left    BREAST LUMPECTOMY WITH RADIOACTIVE SEED AND SENTINEL LYMPH NODE BIOPSY Right 06/08/2020   Procedure: RIGHT BREAST LUMPECTOMY WITH RADIOACTIVE SEED AND SENTINEL LYMPH NODE MAPPING;  Surgeon: [name], MD;  Location: [facility];  Service: General;  Laterality: Right;  PEC BLOCK   CHOLECYSTECTOMY     COLONOSCOPY  01/01/2012   Procedure: COLONOSCOPY;   Surgeon: Daneil Dolin, MD;  [facility];  Service: Endoscopy;  Laterality: N/A;  10:00 AM   COLONOSCOPY WITH PROPOFOL N/A 10/07/2020   Procedure: COLONOSCOPY WITH PROPOFOL;  Surgeon: Daneil Dolin, MD;  [facility];  Service: Endoscopy;  Laterality: N/A;  9:45am   JOINT REPLACEMENT  2006   left knee   POLYPECTOMY  10/07/2020   Procedure: POLYPECTOMY;  Surgeon: Daneil Dolin, MD;  [facility];  Service: Endoscopy;;   US ECHOCARDIOGRAPHY  08/21/2011   LA mild to mod. dilated,mild MR,TR   Current Outpatient Medications on File Prior to Visit  Medication Sig Dispense Refill   cholecalciferol (VITAMIN D) 25 MCG (1000 UNIT) tablet Take 2,000 Units by mouth daily.     exemestane (AROMASIN) 25 MG tablet TAKE 1 TABLET (25 MG TOTAL) BY MOUTH DAILY AFTER BREAKFAST. 90 tablet 3   ibuprofen (ADVIL,MOTRIN) 200 MG tablet Take 800 mg by mouth every 8 (eight) hours as needed for moderate pain.      simvastatin (ZOCOR) 20 MG tablet Take 1 tablet (20 mg total) by mouth daily at 6 PM. 90 tablet 3   triamterene-hydrochlorothiazide (MAXZIDE-25) 37.5-25 MG tablet Take 1 tablet by mouth daily. 90 tablet 3   No current facility-administered medications on file prior to visit.   No Known Allergies  Social History   Socioeconomic History   Marital status: Married    Spouse name: Not on file   Number of children: 2   Years of education: Not on file   Highest education level: Not on file  Occupational History   Not on file  Tobacco Use   Smoking status: Never   Smokeless tobacco: Never  Vaping Use   Vaping Use: Never used  Substance and Sexual Activity   Alcohol use: No   Drug use: No   Sexual activity: Not on file  Other Topics Concern   Not on file  Social History Narrative   Not on file   Social Determinants of Health   Financial Resource Strain: Low Risk    Difficulty of Paying Living Expenses: Not hard at all  Food Insecurity: No Food Insecurity    Worried About Charity fundraiser in the Last Year: Never true   [place] in the Last Year: Never true  Transportation Needs: No Transportation Needs   Lack of Transportation (Medical): No   Lack of Transportation (Non-Medical): No  Physical Activity: Inactive   Days of Exercise per Week: 0 days   Minutes of Exercise per Session: 0 min  Stress: No Stress Concern Present   Feeling of Stress : Not at all  Social Connections: Moderately Isolated   Frequency of Communication with Friends and Family: Twice a week   Frequency of Social Gatherings with Friends and Family: More than three times a week   Attends Religious Services: Never   Marine scientist or Organizations: No   Attends Music therapist: Never   Marital Status: Married  Human resources officer Violence: Not At Risk   Fear of Current or Ex-Partner: No   Emotionally Abused: No   Physically Abused: No   Sexually Abused: No     Review of Systems  All other systems reviewed and are negative.     Objective:   Physical Exam Vitals reviewed. Exam conducted with a chaperone present.  Constitutional:      General: She is not in acute distress.    Appearance: Normal appearance. She is normal weight. She is not ill-appearing, toxic-appearing or diaphoretic.  HENT:     Head: Normocephalic and atraumatic.     Right Ear: Tympanic membrane, ear canal and external ear normal. There is no impacted cerumen.     Left Ear: Tympanic membrane, ear canal and external ear normal. There is no impacted cerumen.     Nose: Nose normal. No congestion or rhinorrhea.     Mouth/Throat:     Mouth: Mucous membranes are moist.     Pharynx: Oropharynx is clear. No oropharyngeal exudate or posterior oropharyngeal erythema.  Eyes:     General: No scleral icterus.       Right eye: No discharge.        Left eye: No discharge.     Extraocular Movements: Extraocular movements intact.     Conjunctiva/sclera: Conjunctivae normal.      Pupils: Pupils are equal, round, and reactive to light.  Neck:     Vascular: No carotid bruit.  Cardiovascular:     Rate and Rhythm: Normal rate. Rhythm irregular.     Pulses: Normal pulses.     Heart sounds: Normal heart sounds. No murmur heard.   No friction rub. No gallop.  Pulmonary:     Effort: Pulmonary effort is normal. No respiratory distress.     Breath sounds: Normal breath  sounds. No stridor. No wheezing, rhonchi or rales.  Chest:     Chest wall: No tenderness.  Abdominal:     General: Abdomen is flat. Bowel sounds are normal. There is no distension.     Palpations: Abdomen is soft. There is no mass.     Tenderness: There is no abdominal tenderness. There is no right CVA tenderness, left CVA tenderness, guarding or rebound.     Hernia: No hernia is present.  Genitourinary:    General: Normal vulva.     Exam position: Lithotomy position.     Labia:        Right: No rash.        Left: No rash.      Vagina: Normal. No erythema or lesions.     Cervix: No cervical motion tenderness, friability, lesion or erythema.     Uterus: Normal.      Adnexa: Right adnexa normal and left adnexa normal.       Right: No mass.         Left: No mass.    Musculoskeletal:     Cervical back: Normal range of motion and neck supple. No rigidity.     Right lower leg: No edema.     Left lower leg: No edema.  Lymphadenopathy:     Cervical: No cervical adenopathy.  Skin:    General: Skin is warm.     Coloration: Skin is not jaundiced or pale.     Findings: No bruising, erythema, lesion or rash.  Neurological:     General: No focal deficit present.     Mental Status: She is alert. Mental status is at baseline.     Cranial Nerves: No cranial nerve deficit.     Sensory: No sensory deficit.     Motor: No weakness.     Coordination: Coordination normal.     Gait: Gait normal.     Deep Tendon Reflexes: Reflexes normal.  Psychiatric:        Mood and Affect: Mood normal.        Behavior:  Behavior normal.        Thought Content: Thought content normal.        Judgment: Judgment normal.          Assessment & Plan:  Benign essential HTN - Plan: CBC with Differential/Platelet, COMPLETE METABOLIC PANEL WITH GFR  Palpitations - Plan: EKG 12-Lead Blood pressure today is outstanding.  I will check a CBC and a CMP to rule out any electrolyte disturbances or anemia that would make surgery dangerous.  I do not foresee this.  She does have an occasional irregular heartbeat auscultated on exam however EKG today is reassuring and I suspect that she is having PACs and PVCs.  She states that she has had this her entire life and has even seen cardiology for this in the past.  I see no contraindication to surgery assuming her lab work is normal.

## 2021-12-20 LAB — CBC WITH DIFFERENTIAL/PLATELET
Absolute Monocytes: 465 cells/uL (ref 200–950)
Basophils Absolute: 59 cells/uL (ref 0–200)
Basophils Relative: 0.6 %
Eosinophils Absolute: 208 cells/uL (ref 15–500)
Eosinophils Relative: 2.1 %
HCT: 42.8 % (ref 35.0–45.0)
Hemoglobin: 14.3 g/dL (ref 11.7–15.5)
Lymphs Abs: 3039 cells/uL (ref 850–3900)
MCH: 28.6 pg (ref 27.0–33.0)
MCHC: 33.4 g/dL (ref 32.0–36.0)
MCV: 85.6 fL (ref 80.0–100.0)
MPV: 9.3 fL (ref 7.5–12.5)
Monocytes Relative: 4.7 %
Neutro Abs: 6128 cells/uL (ref 1500–7800)
Neutrophils Relative %: 61.9 %
Platelets: 252 10*3/uL (ref 140–400)
RBC: 5 10*6/uL (ref 3.80–5.10)
RDW: 13.1 % (ref 11.0–15.0)
Total Lymphocyte: 30.7 %
WBC: 9.9 10*3/uL (ref 3.8–10.8)

## 2021-12-20 LAB — COMPLETE METABOLIC PANEL WITH GFR
AG Ratio: 2 (calc) (ref 1.0–2.5)
ALT: 15 U/L (ref 6–29)
AST: 18 U/L (ref 10–35)
Albumin: 4.8 g/dL (ref 3.6–5.1)
Alkaline phosphatase (APISO): 83 U/L (ref 37–153)
BUN: 22 mg/dL (ref 7–25)
CO2: 26 mmol/L (ref 20–32)
Calcium: 10.5 mg/dL — ABNORMAL HIGH (ref 8.6–10.4)
Chloride: 100 mmol/L (ref 98–110)
Creat: 0.73 mg/dL (ref 0.60–1.00)
Globulin: 2.4 g/dL (calc) (ref 1.9–3.7)
Glucose, Bld: 91 mg/dL (ref 65–99)
Potassium: 4.6 mmol/L (ref 3.5–5.3)
Sodium: 138 mmol/L (ref 135–146)
Total Bilirubin: 1.7 mg/dL — ABNORMAL HIGH (ref 0.2–1.2)
Total Protein: 7.2 g/dL (ref 6.1–8.1)
eGFR: 88 mL/min/{1.73_m2} (ref >=60)

## 2021-12-22 ENCOUNTER — Encounter: Payer: Self-pay | Admitting: Hematology

## 2021-12-22 ENCOUNTER — Inpatient Hospital Stay (HOSPITAL_BASED_OUTPATIENT_CLINIC_OR_DEPARTMENT_OTHER): Payer: Medicare PPO | Admitting: Hematology

## 2021-12-22 ENCOUNTER — Inpatient Hospital Stay: Payer: Medicare PPO | Attending: Hematology

## 2021-12-22 ENCOUNTER — Other Ambulatory Visit: Payer: Self-pay

## 2021-12-22 VITALS — BP 140/83 | HR 78 | Temp 98.8°F | Resp 17 | Ht 61.0 in | Wt 187.4 lb

## 2021-12-22 DIAGNOSIS — E2839 Other primary ovarian failure: Secondary | ICD-10-CM

## 2021-12-22 DIAGNOSIS — Z170 Estrogen receptor positive status [ER+]: Secondary | ICD-10-CM | POA: Diagnosis not present

## 2021-12-22 DIAGNOSIS — C50911 Malignant neoplasm of unspecified site of right female breast: Secondary | ICD-10-CM

## 2021-12-22 DIAGNOSIS — M1990 Unspecified osteoarthritis, unspecified site: Secondary | ICD-10-CM | POA: Diagnosis not present

## 2021-12-22 DIAGNOSIS — G4700 Insomnia, unspecified: Secondary | ICD-10-CM | POA: Diagnosis not present

## 2021-12-22 DIAGNOSIS — C50111 Malignant neoplasm of central portion of right female breast: Secondary | ICD-10-CM | POA: Insufficient documentation

## 2021-12-22 DIAGNOSIS — M2560 Stiffness of unspecified joint, not elsewhere classified: Secondary | ICD-10-CM | POA: Diagnosis not present

## 2021-12-22 LAB — CMP (CANCER CENTER ONLY)
ALT: 16 U/L (ref 0–44)
AST: 17 U/L (ref 15–41)
Albumin: 4.5 g/dL (ref 3.5–5.0)
Alkaline Phosphatase: 79 U/L (ref 38–126)
Anion gap: 7 (ref 5–15)
BUN: 18 mg/dL (ref 8–23)
CO2: 29 mmol/L (ref 22–32)
Calcium: 10.3 mg/dL (ref 8.9–10.3)
Chloride: 104 mmol/L (ref 98–111)
Creatinine: 0.8 mg/dL (ref 0.44–1.00)
GFR, Estimated: >60 mL/min (ref >60)
Glucose, Bld: 96 mg/dL (ref 70–99)
Potassium: 3.7 mmol/L (ref 3.5–5.1)
Sodium: 140 mmol/L (ref 135–145)
Total Bilirubin: 1.4 mg/dL — ABNORMAL HIGH (ref 0.3–1.2)
Total Protein: 7.4 g/dL (ref 6.5–8.1)

## 2021-12-22 LAB — CBC WITH DIFFERENTIAL (CANCER CENTER ONLY)
Abs Immature Granulocytes: 0.04 10*3/uL (ref 0.00–0.07)
Basophils Absolute: 0.1 10*3/uL (ref 0.0–0.1)
Basophils Relative: 1 %
Eosinophils Absolute: 0.2 10*3/uL (ref 0.0–0.5)
Eosinophils Relative: 2 %
HCT: 41 % (ref 36.0–46.0)
Hemoglobin: 13.4 g/dL (ref 12.0–15.0)
Immature Granulocytes: 0 %
Lymphocytes Relative: 28 %
Lymphs Abs: 2.5 10*3/uL (ref 0.7–4.0)
MCH: 27.5 pg (ref 26.0–34.0)
MCHC: 32.7 g/dL (ref 30.0–36.0)
MCV: 84.2 fL (ref 80.0–100.0)
Monocytes Absolute: 0.4 10*3/uL (ref 0.1–1.0)
Monocytes Relative: 5 %
Neutro Abs: 5.7 10*3/uL (ref 1.7–7.7)
Neutrophils Relative %: 64 %
Platelet Count: 208 10*3/uL (ref 150–400)
RBC: 4.87 MIL/uL (ref 3.87–5.11)
RDW: 13.2 % (ref 11.5–15.5)
WBC Count: 8.9 10*3/uL (ref 4.0–10.5)
nRBC: 0 % (ref 0.0–0.2)

## 2021-12-22 NOTE — Progress Notes (Signed)
[place]   [phone] [phone]   Clinic Follow up Note   Patient Care Team: [name], MD as PCP - General (Family Medicine) [name], RN as Oncology Nurse Navigator [name], RN as Oncology Nurse Navigator [name], MD as Consulting Physician (General Surgery) [name], MD as Consulting Physician (Hematology) [name], MD as Consulting Physician (Radiation Oncology) [name], NP as Nurse Practitioner (Nurse Practitioner)  Date of Service:  12/22/2021  CHIEF COMPLAINT: f/u of right breast cancer  CURRENT THERAPY:  Exemestane 39m once daily starting in 09/2020  ASSESSMENT & PLAN:  Shannon PETRENKOis a 71y.o. female with   1. Invasive ductal carcinoma and DCIS of the central right breast, ER+/PR+/HER2-, Grade 2, pT1cN0M0, Oncotype RS 24 -She was diagnosed in 04/2020 with 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 -She underwent right breast lumpectomy and SLNB with Dr CBrantley Stageon 06/08/20. Her RS was 24. I did not recommend adjuvant chemotherapy. She is s/p Adjuvant Radiation. -I started her on antiestrogen therapy with Exemestane in 09/2020. Tolerating well with mild joint stiffness. -Most recent mammogram 04/29/21 was negative. -She is clinically doing well. Lab reviewed, CMP and CBC are WNL. Her physical exam was unremarkable. There is no clinical concern for recurrence. -Continue surveillance. Next mammogram due 04/2022.  -f/u in 6 months   2. Genetic Testing negative for pathogenetic mutations.   3. Bone Health -Her 06/2017 DEXA was normal. Her 09/07/20 DEXA was normal with T-score -0.7 at right femur neck.  -Given Exemestane can weaken her bone, will repeat DEXA every 2 years.  -She is taking vitamin D   4. Arthritis, insomnia  -She has chronic arthritis in her knees, hips and back. -she has had a right knee replacement in the past. -Arthritis stable on Exemestane mostly.      PLAN: -Continue Exemestane -mammogram due 04/2022 -Lab and f/u in 6 months   No problem-specific Assessment & Plan notes found for this encounter.   SUMMARY OF ONCOLOGIC HISTORY: Oncology History Overview Note  Cancer Staging malignant neoplasm right central breast Staging form: Breast, AJCC 8th Edition - Clinical stage from 05/04/2020: Stage IA (cT1, cN0, cM0, G2, ER+, PR+, HER2-) - Signed by BAlla Feeling NP on 05/24/2020 - Pathologic stage from 06/08/2020: Stage IA (pT1c, pN0, cM0, G2, ER+, PR+, HER2-, Oncotype DX score: 24) - Signed by FTruitt Merle MD on 08/22/2020    malignant neoplasm right central breast  04/27/2020 Breast [place]  FINDINGS: Mammogram: Spot compression tomosynthesis views of the right breast were performed. There is persistence of architectural distortion in the lower central to slightly outer right breast. The area of distortion spans approximately 0.9 cm. Ultrasound: Targeted ultrasound is performed in the right breast at 6 o'clock 1 cm from the nipple demonstrating an irregular hypoechoic mass measuring 0.8 x 0.6 x 0.6 cm. The surrounding tissues are distorted. No internal blood flow identified. Targeted ultrasound of the right axilla demonstrates normal-appearing lymph nodes.   IMPRESSION: Right breast mass at 6 o'clock measuring 0.8 cm is suspicious and likely corresponds to the distortion seen mammographically.   05/04/2020 Cancer Staging   Staging form: Breast, AJCC 8th Edition - Clinical stage from 05/04/2020: Stage IA (cT1, cN0, cM0, G2, ER+, PR+, HER2-) - Signed by BAlla Feeling NP on 05/24/2020    05/04/2020 Initial Biopsy   FINAL MICROSCOPIC DIAGNOSIS:  A. BREAST, RIGHT/6:00, BIOPSY:  - Invasive ductal carcinoma.  -  Ductal carcinoma in situ.  - Complex sclerosing lesion. COMMENT:  The carcinoma appears grade 2.  The greatest linear extent of tumor in any one core is 7 mm.   Immunohistochemical and morphometric analysis performed  manually  The tumor cells are NEGATIVE for Her2 (1+).  Estrogen Receptor:       POSITIVE, 90% STRONG STAINING  Progesterone Receptor:   POSITIVE, 90% STRONG STAINING  Proliferation Marker Ki-67:   10%    05/06/2020 Initial Diagnosis   malignant neoplasm right central breast   06/02/2020 Genetic Testing   Negative genetic testing:  No pathogenic variants detected on the [facility] Breast Cancer STAT panel + Common Hereditary Cancers Panel. A variant of uncertain significance (VUS) was detected in the AXIN2 gene called c.1235A>C. The report date is 06/02/2020.  The Breast Cancer STAT Panel offered by [facility] includes sequencing and deletion/duplication analysis for the following 9 genes:  ATM, BRCA1, BRCA2, CDH1, CHEK2, PALB2, PTEN, STK11 and TP53. The Common Hereditary Cancers Panel offered by [facility] includes sequencing and/or deletion duplication testing of the following 48 genes: APC, ATM, AXIN2, BARD1, BMPR1A, BRCA1, BRCA2, BRIP1, CDH1, CDK4, CDKN2A (p14ARF), CDKN2A (p16INK4a), CHEK2, CTNNA1, DICER1, EPCAM (Deletion/duplication testing only), GREM1 (promoter region deletion/duplication testing only), KIT, MEN1, MLH1, MSH2, MSH3, MSH6, MUTYH, NBN, NF1, NHTL1, PALB2, PDGFRA, PMS2, POLD1, POLE, PTEN, RAD50, RAD51C, RAD51D, RNF43, SDHB, SDHC, SDHD, SMAD4, SMARCA4. STK11, TP53, TSC1, TSC2, and VHL.  The following genes were evaluated for sequence changes only: SDHA and HOXB13 c.251G>A variant only.   06/08/2020 Cancer Staging   Staging form: Breast, AJCC 8th Edition - Pathologic stage from 06/08/2020: Stage IA (pT1c, pN0, cM0, G2, ER+, PR+, HER2-, Oncotype DX score: 24) - Signed by [name], MD on 08/22/2020    06/08/2020 Surgery   RIGHT BREAST LUMPECTOMY WITH RADIOACTIVE SEED AND SENTINEL LYMPH NODE MAPPING with Dr Brantley Stage    06/08/2020 Pathology Results   FINAL MICROSCOPIC DIAGNOSIS:   A. BREAST, RIGHT, LUMPECTOMY:  - Invasive ductal carcinoma, grade 2, spanning 1.1 cm.  - High grade ductal  carcinoma in situ with necrosis.  - Biopsy site.  - Resection margins negative for invasive carcinoma.  - In situ carcinoma is <0.1 cm to the posterior margin broadly, see  comment.  - See oncology table.   B. LYMPH NODE, RIGHT AXILLARY, SENTINEL, EXCISION:  - One of one lymph nodes negative for carcinoma (0/1).    06/08/2020 Oncotype testing   Recurrence score 24  Distant recurrence risk at 9 years with AI or Tamoxifen alone is 10% There is less than 1% benefit of adjuvant chemotherapy.    07/26/2020 - 08/25/2020 Radiation Therapy   Adjuvant Radiation with Dr [name] 07/26/20-08/25/20   09/2020 -  Anti-estrogen oral therapy   Exemestane 72m once daily starting in 09/2020   11/22/2020 Survivorship   SCP delivered by [name] NP       INTERVAL HISTORY:  Shannon Gainsis here for a follow up of breast cancer. She was last seen by me on 06/23/21. She presents to the clinic alone. She reports she is doing well overall. She denies hot flashes and joint stiffness. She does note some aching to her back and legs.   All other systems were reviewed with the patient and are negative.  MEDICAL HISTORY:  Past Medical History:  Diagnosis Date   Arthritis    Family history of bone cancer    Family history of colon cancer    Family history of leukemia  Family history of lung cancer    Family history of melanoma    Family history of ovarian cancer    Family history of stomach cancer    Family history of uterine cancer    History of kidney stones    Hyperlipemia    Hypertension    Invasive ductal carcinoma of breast ([place])    Kidney stones before 2011/12/16   states she has passed stones 4 times/ tsf   Lower extremity edema    Obesity     SURGICAL HISTORY: Past Surgical History:  Procedure Laterality Date   BREAST BIOPSY Left    BREAST LUMPECTOMY WITH RADIOACTIVE SEED AND SENTINEL LYMPH NODE BIOPSY Right 06/08/2020   Procedure: RIGHT BREAST LUMPECTOMY WITH RADIOACTIVE SEED  AND SENTINEL LYMPH NODE MAPPING;  Surgeon: [name], MD;  Location: [facility];  Service: General;  Laterality: Right;  PEC BLOCK   CHOLECYSTECTOMY     COLONOSCOPY  01/01/2012   Procedure: COLONOSCOPY;  Surgeon: Daneil Dolin, MD;  [facility];  Service: Endoscopy;  Laterality: N/A;  10:00 AM   COLONOSCOPY WITH PROPOFOL N/A 10/07/2020   Procedure: COLONOSCOPY WITH PROPOFOL;  Surgeon: Daneil Dolin, MD;  [facility];  Service: Endoscopy;  Laterality: N/A;  9:45am   JOINT REPLACEMENT  2006   left knee   POLYPECTOMY  10/07/2020   Procedure: POLYPECTOMY;  Surgeon: Daneil Dolin, MD;  [facility];  Service: Endoscopy;;   US ECHOCARDIOGRAPHY  08/21/2011   LA mild to mod. dilated,mild MR,TR    I have reviewed the social history and family history with the patient and they are unchanged from previous note.  ALLERGIES:  has No Known Allergies.  MEDICATIONS:  Current Outpatient Medications  Medication Sig Dispense Refill   cholecalciferol (VITAMIN D) 25 MCG (1000 UNIT) tablet Take 2,000 Units by mouth daily.     exemestane (AROMASIN) 25 MG tablet TAKE 1 TABLET (25 MG TOTAL) BY MOUTH DAILY AFTER BREAKFAST. 90 tablet 3   ibuprofen (ADVIL,MOTRIN) 200 MG tablet Take 800 mg by mouth every 8 (eight) hours as needed for moderate pain.      simvastatin (ZOCOR) 20 MG tablet Take 1 tablet (20 mg total) by mouth daily at 6 PM. 90 tablet 3   triamterene-hydrochlorothiazide (MAXZIDE-25) 37.5-25 MG tablet Take 1 tablet by mouth daily. 90 tablet 3   No current facility-administered medications for this visit.    PHYSICAL EXAMINATION: ECOG PERFORMANCE STATUS: 0 - Asymptomatic  Vitals:   12/22/21 1128  BP: 140/83  Pulse: 78  Resp: 17  Temp: 98.8 F (37.1 C)  SpO2: 97%   Wt Readings from Last 3 Encounters:  12/22/21 187 lb 6.4 oz (85 kg)  12/19/21 188 lb (85.3 kg)  06/23/21 187 lb 14.4 oz (85.2 kg)     GENERAL:alert, no distress and comfortable SKIN: skin  color, texture, turgor are normal, no rashes or significant lesions EYES: normal, Conjunctiva are pink and non-injected, sclera clear  NECK: supple, thyroid normal size, non-tender, without nodularity LYMPH:  no palpable lymphadenopathy in the cervical, axillary  LUNGS: clear to auscultation and percussion with normal breathing effort HEART: regular rate & rhythm and no murmurs and no lower extremity edema ABDOMEN:abdomen soft, non-tender and normal bowel sounds Musculoskeletal:no cyanosis of digits and no clubbing  NEURO: alert & oriented x 3 with fluent speech, no focal motor/sensory deficits BREAST: No palpable mass, nodules or adenopathy bilaterally. Breast exam benign.   LABORATORY DATA:  I have reviewed  the data as listed CBC Latest Ref Rng & Units 12/22/2021 12/19/2021 06/23/2021  WBC 4.0 - 10.5 K/uL 8.9 9.9 7.3  Hemoglobin 12.0 - 15.0 g/dL 13.4 14.3 12.9  Hematocrit 36.0 - 46.0 % 41.0 42.8 38.9  Platelets 150 - 400 K/uL 208 252 192     CMP Latest Ref Rng & Units 12/22/2021 12/19/2021 06/23/2021  Glucose 70 - 99 mg/dL 96 91 84  BUN 8 - 23 mg/dL _0 Creatinine 0.44 - 1.00 mg/dL 0.80 0.73 0.83  Sodium 135 - 145 mmol/L 140 138 141  Potassium 3.5 - 5.1 mmol/L 3.7 4.6 4.0  Chloride 98 - 111 mmol/L 104 100 104  CO2 22 - 32 mmol/L _1 Calcium 8.9 - 10.3 mg/dL 10.3 10.5(H) 10.1  Total Protein 6.5 - 8.1 g/dL 7.4 7.2 7.4  Total Bilirubin 0.3 - 1.2 mg/dL 1.4(H) 1.7(H) 1.6(H)  Alkaline Phos 38 - 126 U/L 79 - 73  AST 15 - 41 U/L _2 ALT 0 - 44 U/L _3 RADIOGRAPHIC STUDIES: I have personally reviewed the radiological images as listed and agreed with the findings in the report. No results found.    Orders Placed This Encounter  Procedures   MM DIAG BREAST TOMO BILATERAL    Standing Status:   Future    Standing Expiration Date:   12/22/2022    Order Specific Question:   Reason for Exam (SYMPTOM  OR DIAGNOSIS REQUIRED)    Answer:   screening    Order  Specific Question:   Preferred imaging location?    Answer:   [facility]   DG Bone Density    Standing Status:   Future    Standing Expiration Date:   12/22/2022    Order Specific Question:   Reason for Exam (SYMPTOM  OR DIAGNOSIS REQUIRED)    Answer:   screening    Order Specific Question:   Preferred imaging location?    Answer:   [facility]   All questions were answered. The patient knows to call the clinic with any problems, questions or concerns. No barriers to learning was detected. The total time spent in the appointment was 30 minutes.     [name], MD 12/22/2021   I, Wilburn Mylar, am acting as scribe for [name], MD.   I have reviewed the above documentation for accuracy and completeness, and I agree with the above.

## 2021-12-23 NOTE — Progress Notes (Signed)
Surgery orders requested via Epic inbox. °

## 2021-12-28 ENCOUNTER — Encounter (HOSPITAL_COMMUNITY): Payer: Self-pay

## 2021-12-28 NOTE — Progress Notes (Addendum)
PCP - Jenna Luo, MD LOV 12-19-21 epic Cardiologist - McPherson 02-14-2019 Croitoru, Dani Gobble , MD  Follows as needed  PPM/ICD -  Device Orders -  Rep Notified -   Chest x-ray -  EKG - 12-19-21 epic Stress Test -  ECHO -  Cardiac Cath -  Cbc/diff , cmp 12-22-21 epic   Sleep Study -  CPAP -   Fasting Blood Sugar -  Checks Blood Sugar _____ times a day  Blood Thinner Instructions: Aspirin Instructions:  ERAS Protcol - PRE-SURGERY Ensure   COVID TEST- N/A COVID vaccine -no  Activity--Able to walk a flight of stairs withou SOB Anesthesia review: HTN Breast CA  Patient denies shortness of breath, fever, cough and chest pain at PAT appointment   All instructions explained to the patient, with a verbal understanding of the material. Patient agrees to go over the instructions while at home for a better understanding. Patient also instructed to self quarantine after being tested for COVID-19. The opportunity to ask questions was provided.

## 2021-12-28 NOTE — Patient Instructions (Addendum)
DUE TO COVID-19 ONLY ONE VISITOR IS ALLOWED TO COME WITH YOU AND STAY IN THE WAITING ROOM ONLY DURING PRE OP AND PROCEDURE DAY OF SURGERY.            Your procedure is scheduled on: 01-13-22   Report to [facility] Main  Entrance   Report to admitting at      [place]  AM     Call this number if you have problems the morning of surgery [phone]   Remember: NO SOLID FOOD AFTER MIDNIGHT THE NIGHT PRIOR TO SURGERY. NOTHING BY MOUTH EXCEPT CLEAR LIQUIDS UNTIL      0430 am.   PLEASE FINISH ENSURE DRINK PER SURGEON ORDER  WHICH NEEDS TO BE COMPLETED AT     0430 am then nothing b y mouth.     CLEAR LIQUID DIET                                                                    water Black Coffee and tea, regular and decaf No Creamer                            Plain Jell-O any favor except red or purple                                  Fruit ices (not with fruit pulp)                                      Iced Popsicles                                     Carbonated beverages, regular and diet                                    Cranberry, grape and apple juices Sports drinks like Gatorade Lightly seasoned clear broth or consume(fat free) Sugar, honey syrup   _____________________________________________________________________     BRUSH YOUR TEETH MORNING OF SURGERY AND RINSE YOUR MOUTH OUT, NO CHEWING GUM CANDY OR MINTS.     Take these medicines the morning of surgery with A SIP OF WATER: Aromasin  DO NOT TAKE ANY DIABETIC MEDICATIONS DAY OF YOUR SURGERY                               You may not have any metal on your body including hair pins and              piercings  Do not wear jewelry, make-up, lotions, powders,perfumes,    or    deodorant             Do not wear nail polish on your fingernails or toenails .  Do not shave  48 hours prior to surgery.  Do not bring valuables to the hospital. [place].  Contacts, dentures or bridgework may not be worn into surgery.       Patients discharged the day of surgery will not be allowed to drive home. IF YOU ARE HAVING SURGERY AND GOING HOME THE SAME DAY, YOU MUST HAVE AN ADULT TO DRIVE YOU HOME AND BE WITH YOU FOR 24 HOURS. YOU MAY GO HOME BY TAXI OR UBER OR ORTHERWISE, BUT AN ADULT MUST ACCOMPANY YOU HOME AND STAY WITH YOU FOR 24 HOURS.  Name and phone number of your driver:  Special Instructions: N/A              Please read over the following fact sheets you were given: _____________________________________________________________________             [facility] - Preparing for Surgery Before surgery, you can play an important role.  Because skin is not sterile, your skin needs to be as free of germs as possible.  You can reduce the number of germs on your skin by washing with CHG (chlorahexidine gluconate) soap before surgery.  CHG is an antiseptic cleaner which kills germs and bonds with the skin to continue killing germs even after washing. Please DO NOT use if you have an allergy to CHG or antibacterial soaps.  If your skin becomes reddened/irritated stop using the CHG and inform your nurse when you arrive at Short Stay. Do not shave (including legs and underarms) for at least 48 hours prior to the first CHG shower.  You may shave your face/neck. Please follow these instructions carefully:  1.  Shower with CHG Soap the night before surgery and the  morning of Surgery.  2.  If you choose to wash your hair, wash your hair first as usual with your  normal  shampoo.  3.  After you shampoo, rinse your hair and body thoroughly to remove the  shampoo.                           4.  Use CHG as you would any other liquid soap.  You can apply chg directly  to the skin and wash                       Gently with a scrungie or clean washcloth.  5.  Apply the CHG Soap to your body ONLY FROM THE NECK DOWN.   Do not use on face/ open                            Wound or open sores. Avoid contact with eyes, ears mouth and genitals (private parts).                       Wash face,  Genitals (private parts) with your normal soap.             6.  Wash thoroughly, paying special attention to the area where your surgery  will be performed.  7.  Thoroughly rinse your body with warm water from the neck down.  8.  DO NOT shower/wash with your normal soap after using and rinsing off  the CHG Soap.  9.  Pat yourself dry with a clean towel.            10.  Wear clean pajamas.            11.  Place clean sheets on your bed the night of your first shower and do not  sleep with pets. Day of Surgery : Do not apply any lotions/deodorants the morning of surgery.  Please wear clean clothes to the hospital/surgery center.  FAILURE TO FOLLOW THESE INSTRUCTIONS MAY RESULT IN THE CANCELLATION OF YOUR SURGERY PATIENT SIGNATURE_________________________________  NURSE SIGNATURE__________________________________  ________________________________________________________________________    [name]  An incentive spirometer is a tool that can help keep your lungs clear and active. This tool measures how well you are filling your lungs with each breath. Taking long deep breaths may help reverse or decrease the chance of developing breathing (pulmonary) problems (especially infection) following: A long period of time when you are unable to move or be active. BEFORE THE PROCEDURE  If the spirometer includes an indicator to show your best effort, your nurse or respiratory therapist will set it to a desired goal. If possible, sit up straight or lean slightly forward. Try not to slouch. Hold the incentive spirometer in an upright position. INSTRUCTIONS FOR USE  Sit on the edge of your bed if possible, or sit up as far as you can in bed or on a chair. Hold the incentive spirometer in an upright position. Breathe out normally. Place the  mouthpiece in your mouth and seal your lips tightly around it. Breathe in slowly and as deeply as possible, raising the piston or the ball toward the top of the column. Hold your breath for 3-5 seconds or for as long as possible. Allow the piston or ball to fall to the bottom of the column. Remove the mouthpiece from your mouth and breathe out normally. Rest for a few seconds and repeat Steps 1 through 7 at least 10 times every 1-2 hours when you are awake. Take your time and take a few normal breaths between deep breaths. The spirometer may include an indicator to show your best effort. Use the indicator as a goal to work toward during each repetition. After each set of 10 deep breaths, practice coughing to be sure your lungs are clear. If you have an incision (the cut made at the time of surgery), support your incision when coughing by placing a pillow or rolled up towels firmly against it. Once you are able to get out of bed, walk around indoors and cough well. You may stop using the incentive spirometer when instructed by your caregiver.  RISKS AND COMPLICATIONS Take your time so you do not get dizzy or light-headed. If you are in pain, you may need to take or ask for pain medication before doing incentive spirometry. It is harder to take a deep breath if you are having pain. AFTER USE Rest and breathe slowly and easily. It can be helpful to keep track of a log of your progress. Your caregiver can provide you with a simple table to help with this. If you are using the spirometer at home, follow these instructions: [place] IF:  You are having difficultly using the spirometer. You have trouble using the spirometer as often as instructed. Your pain medication is not giving enough relief while using the spirometer. You develop fever of 100.5 F (38.1 C) or higher. SEEK IMMEDIATE MEDICAL CARE IF:  You cough up bloody sputum that had  not been present before. You develop fever of 102 F  (38.9 C) or greater. You develop worsening pain at or near the incision site. MAKE SURE YOU:  Understand these instructions. Will watch your condition. Will get help right away if you are not doing well or get worse. Document Released: 04/09/2007 Document Revised: 02/19/2012 Document Reviewed: 06/10/2007 [facility] Patient Information 2014 [place], [place].   ________________________________________________________________________

## 2021-12-28 NOTE — Progress Notes (Addendum)
Please place orders in epic for preop 

## 2021-12-29 ENCOUNTER — Ambulatory Visit: Payer: Self-pay | Admitting: Physician Assistant

## 2021-12-29 DIAGNOSIS — G8929 Other chronic pain: Secondary | ICD-10-CM

## 2021-12-29 DIAGNOSIS — M1711 Unilateral primary osteoarthritis, right knee: Secondary | ICD-10-CM | POA: Diagnosis not present

## 2021-12-29 NOTE — H&P (View-Only) (Signed)
TOTAL KNEE ADMISSION H&P  Patient is being admitted for right total knee arthroplasty.  Subjective:  Chief Complaint:right knee pain.  HPI: [name], 71 y.o. female, has a history of pain and functional disability in the right knee due to arthritis and has failed non-surgical conservative treatments for greater than 12 weeks to includeNSAID's and/or analgesics, corticosteriod injections, and activity modification.  Onset of symptoms was gradual, starting >10 years ago with gradually worsening course since that time. The patient noted no past surgery on the right knee(s).  Patient currently rates pain in the right knee(s) at 8 out of 10 with activity. Patient has night pain, worsening of pain with activity and weight bearing, pain that interferes with activities of daily living, pain with passive range of motion, crepitus, and joint swelling.  Patient has evidence of periarticular osteophytes and joint space narrowing by imaging studies. There is no active infection.  Patient Active Problem List   Diagnosis Date Noted   Genetic testing 06/04/2020   Family history of ovarian cancer    Family history of colon cancer    Family history of melanoma    Family history of bone cancer    Family history of uterine cancer    Family history of stomach cancer    Family history of leukemia    Family history of lung cancer    malignant neoplasm right central breast    Vitamin D deficiency 06/20/2017   Hyperlipidemia 07/17/2013   HTN (hypertension) 07/17/2013   PAC (premature atrial contractions) 07/17/2013   Past Medical History:  Diagnosis Date   Arthritis    Family history of bone cancer    Family history of colon cancer    Family history of leukemia    Family history of lung cancer    Family history of melanoma    Family history of ovarian cancer    Family history of stomach cancer    Family history of uterine cancer    History of kidney stones    Hyperlipemia    Hypertension     Invasive ductal carcinoma of breast ([place])    Lower extremity edema    Obesity     Past Surgical History:  Procedure Laterality Date   BREAST BIOPSY Left    BREAST LUMPECTOMY WITH RADIOACTIVE SEED AND SENTINEL LYMPH NODE BIOPSY Right 06/08/2020   Procedure: RIGHT BREAST LUMPECTOMY WITH RADIOACTIVE SEED AND SENTINEL LYMPH NODE MAPPING;  Surgeon: [name], MD;  Location: [place];  Service: General;  Laterality: Right;  PEC BLOCK   CHOLECYSTECTOMY     COLONOSCOPY  01/01/2012   Procedure: COLONOSCOPY;  Surgeon: Daneil Dolin, MD;  [facility];  Service: Endoscopy;  Laterality: N/A;  10:00 AM   COLONOSCOPY WITH PROPOFOL N/A 10/07/2020   Procedure: COLONOSCOPY WITH PROPOFOL;  Surgeon: Daneil Dolin, MD;  [facility];  Service: Endoscopy;  Laterality: N/A;  9:45am   JOINT REPLACEMENT  2006   left knee   POLYPECTOMY  10/07/2020   Procedure: POLYPECTOMY;  Surgeon: Daneil Dolin, MD;  [facility];  Service: Endoscopy;;   US ECHOCARDIOGRAPHY  08/21/2011   LA mild to mod. dilated,mild MR,TR    Current Outpatient Medications  Medication Sig Dispense Refill Last Dose   cholecalciferol (VITAMIN D) 25 MCG (1000 UNIT) tablet Take 2,000 Units by mouth daily.      exemestane (AROMASIN) 25 MG tablet TAKE 1 TABLET (25 MG TOTAL) BY MOUTH DAILY AFTER BREAKFAST. 90 tablet 3  ibuprofen (ADVIL,MOTRIN) 200 MG tablet Take 800 mg by mouth every 8 (eight) hours as needed for moderate pain.       simvastatin (ZOCOR) 20 MG tablet Take 1 tablet (20 mg total) by mouth daily at 6 PM. 90 tablet 3    triamterene-hydrochlorothiazide (MAXZIDE-25) 37.5-25 MG tablet Take 1 tablet by mouth daily. 90 tablet 3    No current facility-administered medications for this visit.   No Known Allergies  Social History   Tobacco Use   Smoking status: Never   Smokeless tobacco: Never  Substance Use Topics   Alcohol use: No    Family History  Problem Relation Age of Onset   Hypertension  Mother    Colon cancer Mother 59   Hypertension Father    Heart attack Father    Melanoma Father        dx. in his early 9s (x3)   Uterine cancer Maternal Aunt        dx. >50   Cancer Daughter 50       ovarian (dysgerminoma)   Leukemia Paternal Uncle        dx. in his late 17s   Stomach cancer Maternal Grandmother        dx. in her 75s   Bone cancer Maternal Aunt        dx. in her early 96s   Lung cancer Maternal Aunt    Testicular cancer Cousin        dx. in his late 30s/early 62s (maternal cousin)   Anesthesia problems Neg Hx      Review of Systems  Cardiovascular:  Positive for leg swelling.  Musculoskeletal:  Positive for arthralgias.  All other systems reviewed and are negative.  Objective:  Physical Exam Constitutional:      General: She is not in acute distress.    Appearance: Normal appearance.  HENT:     Head: Normocephalic and atraumatic.  Eyes:     Extraocular Movements: Extraocular movements intact.     Pupils: Pupils are equal, round, and reactive to light.  Cardiovascular:     Rate and Rhythm: Normal rate and regular rhythm.     Pulses: Normal pulses.     Heart sounds: Normal heart sounds. No murmur heard. Pulmonary:     Effort: Pulmonary effort is normal. No respiratory distress.     Breath sounds: Normal breath sounds. No wheezing.  Abdominal:     General: Abdomen is flat. Bowel sounds are normal. There is no distension.     Palpations: Abdomen is soft.     Tenderness: There is no abdominal tenderness.  Musculoskeletal:     Cervical back: Normal range of motion and neck supple.     Right knee: Swelling and bony tenderness present. Decreased range of motion. Tenderness present.  Lymphadenopathy:     Cervical: No cervical adenopathy.  Skin:    General: Skin is warm and dry.     Findings: No erythema or rash.  Neurological:     General: No focal deficit present.     Mental Status: She is alert and oriented to person, place, and time.   Psychiatric:        Mood and Affect: Mood normal.        Behavior: Behavior normal.    Vital signs in last 24 hours: @VSRANGES @  Labs:   Estimated body mass index is 35.41 kg/m as calculated from the following:   Height as of 12/22/21: 5\' 1"  (1.549 m).   Weight  as of 12/22/21: 85 kg.   Imaging Review Plain radiographs demonstrate severe degenerative joint disease of the right knee(s). The overall alignment issignificant varus. The bone quality appears to be good for age and reported activity level.      Assessment/Plan:  End stage arthritis, right knee   The patient history, physical examination, clinical judgment of the provider and imaging studies are consistent with end stage degenerative joint disease of the right knee(s) and total knee arthroplasty is deemed medically necessary. The treatment options including medical management, injection therapy arthroscopy and arthroplasty were discussed at length. The risks and benefits of total knee arthroplasty were presented and reviewed. The risks due to aseptic loosening, infection, stiffness, patella tracking problems, thromboembolic complications and other imponderables were discussed. The patient acknowledged the explanation, agreed to proceed with the plan and consent was signed. Patient is being admitted for inpatient treatment for surgery, pain control, PT, OT, prophylactic antibiotics, VTE prophylaxis, progressive ambulation and ADL's and discharge planning. The patient is planning to be discharged  home with outpt PT    Anticipated LOS equal to or greater than 2 midnights due to - Age 22 and older with one or more of the following:  - Obesity  - Expected need for hospital services (PT, OT, Nursing) required for safe  discharge  - Anticipated need for postoperative skilled nursing care or inpatient rehab  - Active co-morbidities: None OR   - Unanticipated findings during/Post Surgery: None  - Patient is a high risk of  re-admission due to: None

## 2021-12-29 NOTE — H&P (Signed)
TOTAL KNEE ADMISSION H&P  Patient is being admitted for right total knee arthroplasty.  Subjective:  Chief Complaint:right knee pain.  HPI: [name], 71 y.o. female, has a history of pain and functional disability in the right knee due to arthritis and has failed non-surgical conservative treatments for greater than 12 weeks to includeNSAID's and/or analgesics, corticosteriod injections, and activity modification.  Onset of symptoms was gradual, starting >10 years ago with gradually worsening course since that time. The patient noted no past surgery on the right knee(s).  Patient currently rates pain in the right knee(s) at 8 out of 10 with activity. Patient has night pain, worsening of pain with activity and weight bearing, pain that interferes with activities of daily living, pain with passive range of motion, crepitus, and joint swelling.  Patient has evidence of periarticular osteophytes and joint space narrowing by imaging studies. There is no active infection.  Patient Active Problem List   Diagnosis Date Noted   Genetic testing 06/04/2020   Family history of ovarian cancer    Family history of colon cancer    Family history of melanoma    Family history of bone cancer    Family history of uterine cancer    Family history of stomach cancer    Family history of leukemia    Family history of lung cancer    malignant neoplasm right central breast    Vitamin D deficiency 06/20/2017   Hyperlipidemia 07/17/2013   HTN (hypertension) 07/17/2013   PAC (premature atrial contractions) 07/17/2013   Past Medical History:  Diagnosis Date   Arthritis    Family history of bone cancer    Family history of colon cancer    Family history of leukemia    Family history of lung cancer    Family history of melanoma    Family history of ovarian cancer    Family history of stomach cancer    Family history of uterine cancer    History of kidney stones    Hyperlipemia    Hypertension     Invasive ductal carcinoma of breast ([place])    Lower extremity edema    Obesity     Past Surgical History:  Procedure Laterality Date   BREAST BIOPSY Left    BREAST LUMPECTOMY WITH RADIOACTIVE SEED AND SENTINEL LYMPH NODE BIOPSY Right 06/08/2020   Procedure: RIGHT BREAST LUMPECTOMY WITH RADIOACTIVE SEED AND SENTINEL LYMPH NODE MAPPING;  Surgeon: [name], MD;  Location: [place];  Service: General;  Laterality: Right;  PEC BLOCK   CHOLECYSTECTOMY     COLONOSCOPY  01/01/2012   Procedure: COLONOSCOPY;  Surgeon: Daneil Dolin, MD;  [facility];  Service: Endoscopy;  Laterality: N/A;  10:00 AM   COLONOSCOPY WITH PROPOFOL N/A 10/07/2020   Procedure: COLONOSCOPY WITH PROPOFOL;  Surgeon: Daneil Dolin, MD;  [facility];  Service: Endoscopy;  Laterality: N/A;  9:45am   JOINT REPLACEMENT  2006   left knee   POLYPECTOMY  10/07/2020   Procedure: POLYPECTOMY;  Surgeon: Daneil Dolin, MD;  [facility];  Service: Endoscopy;;   US ECHOCARDIOGRAPHY  08/21/2011   LA mild to mod. dilated,mild MR,TR    Current Outpatient Medications  Medication Sig Dispense Refill Last Dose   cholecalciferol (VITAMIN D) 25 MCG (1000 UNIT) tablet Take 2,000 Units by mouth daily.      exemestane (AROMASIN) 25 MG tablet TAKE 1 TABLET (25 MG TOTAL) BY MOUTH DAILY AFTER BREAKFAST. 90 tablet 3  ibuprofen (ADVIL,MOTRIN) 200 MG tablet Take 800 mg by mouth every 8 (eight) hours as needed for moderate pain.       simvastatin (ZOCOR) 20 MG tablet Take 1 tablet (20 mg total) by mouth daily at 6 PM. 90 tablet 3    triamterene-hydrochlorothiazide (MAXZIDE-25) 37.5-25 MG tablet Take 1 tablet by mouth daily. 90 tablet 3    No current facility-administered medications for this visit.   No Known Allergies  Social History   Tobacco Use   Smoking status: Never   Smokeless tobacco: Never  Substance Use Topics   Alcohol use: No    Family History  Problem Relation Age of Onset   Hypertension  Mother    Colon cancer Mother 31   Hypertension Father    Heart attack Father    Melanoma Father        dx. in his early 1s (x3)   Uterine cancer Maternal Aunt        dx. >50   Cancer Daughter 69       ovarian (dysgerminoma)   Leukemia Paternal Uncle        dx. in his late 42s   Stomach cancer Maternal Grandmother        dx. in her 55s   Bone cancer Maternal Aunt        dx. in her early 32s   Lung cancer Maternal Aunt    Testicular cancer Cousin        dx. in his late 30s/early 38s (maternal cousin)   Anesthesia problems Neg Hx      Review of Systems  Cardiovascular:  Positive for leg swelling.  Musculoskeletal:  Positive for arthralgias.  All other systems reviewed and are negative.  Objective:  Physical Exam Constitutional:      General: She is not in acute distress.    Appearance: Normal appearance.  HENT:     Head: Normocephalic and atraumatic.  Eyes:     Extraocular Movements: Extraocular movements intact.     Pupils: Pupils are equal, round, and reactive to light.  Cardiovascular:     Rate and Rhythm: Normal rate and regular rhythm.     Pulses: Normal pulses.     Heart sounds: Normal heart sounds. No murmur heard. Pulmonary:     Effort: Pulmonary effort is normal. No respiratory distress.     Breath sounds: Normal breath sounds. No wheezing.  Abdominal:     General: Abdomen is flat. Bowel sounds are normal. There is no distension.     Palpations: Abdomen is soft.     Tenderness: There is no abdominal tenderness.  Musculoskeletal:     Cervical back: Normal range of motion and neck supple.     Right knee: Swelling and bony tenderness present. Decreased range of motion. Tenderness present.  Lymphadenopathy:     Cervical: No cervical adenopathy.  Skin:    General: Skin is warm and dry.     Findings: No erythema or rash.  Neurological:     General: No focal deficit present.     Mental Status: She is alert and oriented to person, place, and time.   Psychiatric:        Mood and Affect: Mood normal.        Behavior: Behavior normal.    Vital signs in last 24 hours: @VSRANGES @  Labs:   Estimated body mass index is 35.41 kg/m as calculated from the following:   Height as of 12/22/21: 5\' 1"  (1.549 m).   Weight  as of 12/22/21: 85 kg.   Imaging Review Plain radiographs demonstrate severe degenerative joint disease of the right knee(s). The overall alignment issignificant varus. The bone quality appears to be good for age and reported activity level.      Assessment/Plan:  End stage arthritis, right knee   The patient history, physical examination, clinical judgment of the provider and imaging studies are consistent with end stage degenerative joint disease of the right knee(s) and total knee arthroplasty is deemed medically necessary. The treatment options including medical management, injection therapy arthroscopy and arthroplasty were discussed at length. The risks and benefits of total knee arthroplasty were presented and reviewed. The risks due to aseptic loosening, infection, stiffness, patella tracking problems, thromboembolic complications and other imponderables were discussed. The patient acknowledged the explanation, agreed to proceed with the plan and consent was signed. Patient is being admitted for inpatient treatment for surgery, pain control, PT, OT, prophylactic antibiotics, VTE prophylaxis, progressive ambulation and ADL's and discharge planning. The patient is planning to be discharged  home with outpt PT    Anticipated LOS equal to or greater than 2 midnights due to - Age 34 and older with one or more of the following:  - Obesity  - Expected need for hospital services (PT, OT, Nursing) required for safe  discharge  - Anticipated need for postoperative skilled nursing care or inpatient rehab  - Active co-morbidities: None OR   - Unanticipated findings during/Post Surgery: None  - Patient is a high risk of  re-admission due to: None

## 2022-01-02 ENCOUNTER — Encounter (HOSPITAL_COMMUNITY)
Admission: RE | Admit: 2022-01-02 | Discharge: 2022-01-02 | Disposition: A | Discharge status: Home / Self Care | Payer: Medicare PPO | Source: Ambulatory Visit | Attending: Orthopedic Surgery | Admitting: Orthopedic Surgery

## 2022-01-02 ENCOUNTER — Encounter (HOSPITAL_COMMUNITY): Payer: Self-pay

## 2022-01-02 ENCOUNTER — Other Ambulatory Visit: Payer: Self-pay

## 2022-01-02 VITALS — BP 159/82 | HR 71 | Temp 98.8°F | Resp 16 | Ht 62.0 in | Wt 183.0 lb

## 2022-01-02 DIAGNOSIS — M1711 Unilateral primary osteoarthritis, right knee: Secondary | ICD-10-CM | POA: Insufficient documentation

## 2022-01-02 DIAGNOSIS — G8929 Other chronic pain: Secondary | ICD-10-CM | POA: Insufficient documentation

## 2022-01-02 DIAGNOSIS — M25561 Pain in right knee: Secondary | ICD-10-CM | POA: Insufficient documentation

## 2022-01-02 DIAGNOSIS — Z01812 Encounter for preprocedural laboratory examination: Secondary | ICD-10-CM | POA: Diagnosis not present

## 2022-01-02 DIAGNOSIS — Z01818 Encounter for other preprocedural examination: Secondary | ICD-10-CM

## 2022-01-02 HISTORY — DX: Atrial premature depolarization: I49.1

## 2022-01-02 LAB — TYPE AND SCREEN
ABO/RH(D): B POS
Antibody Screen: NEGATIVE

## 2022-01-02 LAB — SURGICAL PCR SCREEN
MRSA, PCR: NEGATIVE
Staphylococcus aureus: NEGATIVE

## 2022-01-02 LAB — PROTIME-INR
INR: 1 (ref 0.8–1.2)
Prothrombin Time: 13.3 seconds (ref 11.4–15.2)

## 2022-01-02 LAB — APTT: aPTT: 28 seconds (ref 24–36)

## 2022-01-04 NOTE — Care Plan (Signed)
Ortho Bundle Case Management Note  Patient Details  Name: [name] MRN: [id number] Date of Birth: March 26, 1951    Met with patient in the office for H&P. Will discharge to home with family to assist. Has equipment at home. OPPT set up with [facility]. Patient and MD in agreement with plan. Choice offered                 DME Arranged:    DME Agency:     HH Arranged:    HH Agency:     Additional Comments: Please contact me with any questions of if this plan should need to change.  Ladell Heads,  [facility]  [phone] 01/04/2022, 2:46 PM

## 2022-01-12 NOTE — Anesthesia Preprocedure Evaluation (Addendum)
Anesthesia Evaluation  Patient identified by MRN, date of birth, ID band Patient awake    Reviewed: Allergy & Precautions, NPO status , Patient's Chart, lab work & pertinent test results  Airway Mallampati: II  TM Distance: >3 FB Neck ROM: Full    Dental  (+) Dental Advisory Given, Teeth Intact   Pulmonary neg pulmonary ROS,    Pulmonary exam normal breath sounds clear to auscultation       Cardiovascular hypertension, Normal cardiovascular exam Rhythm:Regular Rate:Normal     Neuro/Psych negative neurological ROS     GI/Hepatic negative GI ROS, Neg liver ROS,   Endo/Other  negative endocrine ROS  Renal/GU negative Renal ROS     Musculoskeletal  (+) Arthritis ,   Abdominal (+) + obese,   Peds  Hematology negative hematology ROS (+)   Anesthesia Other Findings   Reproductive/Obstetrics                            Anesthesia Physical Anesthesia Plan  ASA: 3  Anesthesia Plan: Spinal   Post-op Pain Management: Regional block, Tylenol PO (pre-op) and Celebrex PO (pre-op)   Induction: Intravenous  PONV Risk Score and Plan: 2 and Ondansetron, Propofol infusion, Dexamethasone, TIVA and Treatment may vary due to age or medical condition  Airway Management Planned: Natural Airway  Additional Equipment:   Intra-op Plan:   Post-operative Plan:   Informed Consent: I have reviewed the patients History and Physical, chart, labs and discussed the procedure including the risks, benefits and alternatives for the proposed anesthesia with the patient or authorized representative who has indicated his/her understanding and acceptance.     Dental advisory given  Plan Discussed with: CRNA  Anesthesia Plan Comments:        Anesthesia Quick Evaluation

## 2022-01-13 ENCOUNTER — Ambulatory Visit (HOSPITAL_COMMUNITY): Payer: Medicare PPO | Admitting: Certified Registered Nurse Anesthetist

## 2022-01-13 ENCOUNTER — Encounter (HOSPITAL_COMMUNITY): Payer: Self-pay | Admitting: Orthopedic Surgery

## 2022-01-13 ENCOUNTER — Other Ambulatory Visit: Payer: Self-pay

## 2022-01-13 ENCOUNTER — Ambulatory Visit (HOSPITAL_COMMUNITY): Payer: Medicare PPO

## 2022-01-13 ENCOUNTER — Observation Stay (HOSPITAL_COMMUNITY)
Admission: RE | Admit: 2022-01-13 | Discharge: 2022-01-14 | Disposition: A | Discharge status: Home / Self Care | Payer: Medicare PPO | Source: Ambulatory Visit | Attending: Orthopedic Surgery | Admitting: Orthopedic Surgery

## 2022-01-13 ENCOUNTER — Encounter (HOSPITAL_COMMUNITY)
Admission: RE | Disposition: A | Discharge status: Home / Self Care | Payer: Self-pay | Source: Ambulatory Visit | Attending: Orthopedic Surgery

## 2022-01-13 DIAGNOSIS — M1711 Unilateral primary osteoarthritis, right knee: Secondary | ICD-10-CM | POA: Diagnosis not present

## 2022-01-13 DIAGNOSIS — I10 Essential (primary) hypertension: Secondary | ICD-10-CM | POA: Insufficient documentation

## 2022-01-13 DIAGNOSIS — Z96651 Presence of right artificial knee joint: Secondary | ICD-10-CM | POA: Diagnosis not present

## 2022-01-13 DIAGNOSIS — Z471 Aftercare following joint replacement surgery: Secondary | ICD-10-CM | POA: Diagnosis not present

## 2022-01-13 DIAGNOSIS — M2341 Loose body in knee, right knee: Secondary | ICD-10-CM | POA: Diagnosis not present

## 2022-01-13 DIAGNOSIS — M25661 Stiffness of right knee, not elsewhere classified: Secondary | ICD-10-CM | POA: Diagnosis not present

## 2022-01-13 DIAGNOSIS — Z853 Personal history of malignant neoplasm of breast: Secondary | ICD-10-CM | POA: Diagnosis not present

## 2022-01-13 DIAGNOSIS — G8918 Other acute postprocedural pain: Secondary | ICD-10-CM | POA: Diagnosis not present

## 2022-01-13 DIAGNOSIS — M25761 Osteophyte, right knee: Secondary | ICD-10-CM | POA: Diagnosis not present

## 2022-01-13 DIAGNOSIS — Z9889 Other specified postprocedural states: Secondary | ICD-10-CM

## 2022-01-13 DIAGNOSIS — Z96652 Presence of left artificial knee joint: Secondary | ICD-10-CM | POA: Insufficient documentation

## 2022-01-13 HISTORY — PX: TOTAL KNEE ARTHROPLASTY: SHX125

## 2022-01-13 LAB — ABO/RH: ABO/RH(D): B POS

## 2022-01-13 SURGERY — ARTHROPLASTY, KNEE, TOTAL
Anesthesia: Spinal | Site: Knee | Laterality: Right

## 2022-01-13 MED ORDER — TRIAMTERENE-HCTZ 37.5-25 MG PO TABS
1.0000 | ORAL_TABLET | Freq: Every day | ORAL | Status: DC
  Administered 2022-01-14: 1 via ORAL
  Filled 2022-01-13: qty 1

## 2022-01-13 MED ORDER — BUPIVACAINE LIPOSOME 1.3 % IJ SUSP
INTRAMUSCULAR | Status: AC
  Filled 2022-01-13: qty 20

## 2022-01-13 MED ORDER — MENTHOL 3 MG MT LOZG: 1.0000 | LOZENGE | OROMUCOSAL | Status: DC | PRN

## 2022-01-13 MED ORDER — FENTANYL CITRATE (PF) 100 MCG/2ML IJ SOLN
INTRAMUSCULAR | Status: AC
  Filled 2022-01-13: qty 2

## 2022-01-13 MED ORDER — PROPOFOL 500 MG/50ML IV EMUL
INTRAVENOUS | Status: DC | PRN
Start: 2022-01-13 — End: 2022-01-13
  Administered 2022-01-13: 100 ug/kg/min via INTRAVENOUS

## 2022-01-13 MED ORDER — CELECOXIB 100 MG PO CAPS
100.0000 mg | ORAL_CAPSULE | Freq: Two times a day (BID) | ORAL | 0 refills | Status: AC
Start: 2022-01-13 — End: 2022-01-27

## 2022-01-13 MED ORDER — ACETAMINOPHEN 500 MG PO TABS: 1000.0000 mg | ORAL_TABLET | Freq: Three times a day (TID) | ORAL | 0 refills | Status: AC | PRN

## 2022-01-13 MED ORDER — SODIUM CHLORIDE 0.9% FLUSH
INTRAVENOUS | Status: DC | PRN
  Administered 2022-01-13: 60 mL

## 2022-01-13 MED ORDER — CELECOXIB 200 MG PO CAPS: 400.0000 mg | ORAL_CAPSULE | Freq: Once | ORAL | Status: DC

## 2022-01-13 MED ORDER — ORAL CARE MOUTH RINSE: 15.0000 mL | Freq: Once | OROMUCOSAL | Status: AC

## 2022-01-13 MED ORDER — MIDAZOLAM HCL 2 MG/2ML IJ SOLN
INTRAMUSCULAR | Status: AC
  Filled 2022-01-13: qty 2

## 2022-01-13 MED ORDER — SODIUM CHLORIDE 0.9 % IR SOLN
Status: DC | PRN
  Administered 2022-01-13: 3000 mL

## 2022-01-13 MED ORDER — SIMVASTATIN 20 MG PO TABS
20.0000 mg | ORAL_TABLET | Freq: Every day | ORAL | Status: DC
  Administered 2022-01-13: 20 mg via ORAL
  Filled 2022-01-13: qty 1

## 2022-01-13 MED ORDER — CLONIDINE HCL (ANALGESIA) 100 MCG/ML EP SOLN
EPIDURAL | Status: DC | PRN
  Administered 2022-01-13: 80 ug

## 2022-01-13 MED ORDER — DIPHENHYDRAMINE HCL 12.5 MG/5ML PO ELIX: 12.5000 mg | ORAL_SOLUTION | ORAL | Status: DC | PRN

## 2022-01-13 MED ORDER — ISOPROPYL ALCOHOL 70 % SOLN
Status: DC | PRN
  Administered 2022-01-13: 1 via TOPICAL

## 2022-01-13 MED ORDER — KETOROLAC TROMETHAMINE 15 MG/ML IJ SOLN
INTRAMUSCULAR | Status: AC
  Filled 2022-01-13: qty 1

## 2022-01-13 MED ORDER — ACETAMINOPHEN 325 MG PO TABS: 325.0000 mg | ORAL_TABLET | Freq: Four times a day (QID) | ORAL | Status: DC | PRN

## 2022-01-13 MED ORDER — ONDANSETRON HCL 4 MG PO TABS: 4.0000 mg | ORAL_TABLET | Freq: Three times a day (TID) | ORAL | 0 refills | Status: AC | PRN

## 2022-01-13 MED ORDER — LACTATED RINGERS IV BOLUS
500.0000 mL | Freq: Once | INTRAVENOUS | Status: AC
  Administered 2022-01-13: 500 mL via INTRAVENOUS

## 2022-01-13 MED ORDER — SODIUM CHLORIDE 0.9 % IV SOLN: INTRAVENOUS | Status: DC

## 2022-01-13 MED ORDER — BUPIVACAINE LIPOSOME 1.3 % IJ SUSP
INTRAMUSCULAR | Status: DC | PRN
  Administered 2022-01-13: 20 mL

## 2022-01-13 MED ORDER — SODIUM CHLORIDE (PF) 0.9 % IJ SOLN
INTRAMUSCULAR | Status: AC
  Filled 2022-01-13: qty 10

## 2022-01-13 MED ORDER — ACETAMINOPHEN 500 MG PO TABS
1000.0000 mg | ORAL_TABLET | Freq: Once | ORAL | Status: AC
  Administered 2022-01-13: 1000 mg via ORAL
  Filled 2022-01-13: qty 2

## 2022-01-13 MED ORDER — ONDANSETRON HCL 4 MG PO TABS: 4.0000 mg | ORAL_TABLET | Freq: Four times a day (QID) | ORAL | Status: DC | PRN

## 2022-01-13 MED ORDER — OXYCODONE HCL 5 MG PO TABS
ORAL_TABLET | ORAL | Status: AC
  Filled 2022-01-13: qty 1

## 2022-01-13 MED ORDER — DEXAMETHASONE SODIUM PHOSPHATE 4 MG/ML IJ SOLN
INTRAMUSCULAR | Status: DC | PRN
  Administered 2022-01-13: 5 mg via PERINEURAL

## 2022-01-13 MED ORDER — PANTOPRAZOLE SODIUM 40 MG PO TBEC
40.0000 mg | DELAYED_RELEASE_TABLET | Freq: Every day | ORAL | Status: DC
  Administered 2022-01-14: 40 mg via ORAL
  Filled 2022-01-13 (×3): qty 1

## 2022-01-13 MED ORDER — POVIDONE-IODINE 10 % EX SWAB
2.0000 "application " | Freq: Once | CUTANEOUS | Status: AC
  Administered 2022-01-13: 2 via TOPICAL

## 2022-01-13 MED ORDER — KETOROLAC TROMETHAMINE 15 MG/ML IJ SOLN
7.5000 mg | Freq: Four times a day (QID) | INTRAMUSCULAR | Status: AC
  Administered 2022-01-13 – 2022-01-14 (×4): 7.5 mg via INTRAVENOUS
  Filled 2022-01-13 (×3): qty 1

## 2022-01-13 MED ORDER — TRANEXAMIC ACID-NACL 1000-0.7 MG/100ML-% IV SOLN
1000.0000 mg | INTRAVENOUS | Status: AC
  Administered 2022-01-13: 1000 mg via INTRAVENOUS
  Filled 2022-01-13: qty 100

## 2022-01-13 MED ORDER — ASPIRIN EC 81 MG PO TBEC: 81.0000 mg | DELAYED_RELEASE_TABLET | Freq: Two times a day (BID) | ORAL | 0 refills | Status: AC

## 2022-01-13 MED ORDER — WATER FOR IRRIGATION, STERILE IR SOLN
Status: DC | PRN
  Administered 2022-01-13: 2000 mL

## 2022-01-13 MED ORDER — PHENYLEPHRINE HCL-NACL 20-0.9 MG/250ML-% IV SOLN
INTRAVENOUS | Status: DC | PRN
  Administered 2022-01-13: 15 ug/min via INTRAVENOUS

## 2022-01-13 MED ORDER — PHENOL 1.4 % MT LIQD: 1.0000 | OROMUCOSAL | Status: DC | PRN

## 2022-01-13 MED ORDER — OXYCODONE HCL 5 MG PO TABS
5.0000 mg | ORAL_TABLET | ORAL | Status: DC | PRN
  Administered 2022-01-13 – 2022-01-14 (×3): 5 mg via ORAL
  Filled 2022-01-13 (×2): qty 1

## 2022-01-13 MED ORDER — DEXAMETHASONE SODIUM PHOSPHATE 10 MG/ML IJ SOLN
INTRAMUSCULAR | Status: AC
  Filled 2022-01-13: qty 1

## 2022-01-13 MED ORDER — ACETAMINOPHEN 500 MG PO TABS: 1000.0000 mg | ORAL_TABLET | Freq: Once | ORAL | Status: DC

## 2022-01-13 MED ORDER — SODIUM CHLORIDE 0.9 % IR SOLN
Status: DC | PRN
  Administered 2022-01-13: 500 mL

## 2022-01-13 MED ORDER — DEXAMETHASONE SODIUM PHOSPHATE 10 MG/ML IJ SOLN
8.0000 mg | Freq: Once | INTRAMUSCULAR | Status: AC
  Administered 2022-01-13: 8 mg via INTRAVENOUS

## 2022-01-13 MED ORDER — LACTATED RINGERS IV BOLUS
250.0000 mL | Freq: Once | INTRAVENOUS | Status: AC
  Administered 2022-01-13: 250 mL via INTRAVENOUS

## 2022-01-13 MED ORDER — ONDANSETRON HCL 4 MG/2ML IJ SOLN: 4.0000 mg | Freq: Four times a day (QID) | INTRAMUSCULAR | Status: DC | PRN

## 2022-01-13 MED ORDER — EXEMESTANE 25 MG PO TABS
25.0000 mg | ORAL_TABLET | Freq: Every day | ORAL | Status: DC
  Administered 2022-01-14: 25 mg via ORAL
  Filled 2022-01-13: qty 1

## 2022-01-13 MED ORDER — CEFAZOLIN SODIUM-DEXTROSE 2-4 GM/100ML-% IV SOLN
2.0000 g | INTRAVENOUS | Status: AC
  Administered 2022-01-13: 2 g via INTRAVENOUS
  Filled 2022-01-13: qty 100

## 2022-01-13 MED ORDER — BUPIVACAINE IN DEXTROSE 0.75-8.25 % IT SOLN
INTRATHECAL | Status: DC | PRN
  Administered 2022-01-13: 1.8 mL via INTRATHECAL

## 2022-01-13 MED ORDER — BUPIVACAINE LIPOSOME 1.3 % IJ SUSP: 20.0000 mL | Freq: Once | INTRAMUSCULAR | Status: DC

## 2022-01-13 MED ORDER — MEPERIDINE HCL 50 MG/ML IJ SOLN: 6.2500 mg | INTRAMUSCULAR | Status: DC | PRN

## 2022-01-13 MED ORDER — CHLORHEXIDINE GLUCONATE 0.12 % MT SOLN
15.0000 mL | Freq: Once | OROMUCOSAL | Status: AC
  Administered 2022-01-13: 15 mL via OROMUCOSAL

## 2022-01-13 MED ORDER — ONDANSETRON HCL 4 MG/2ML IJ SOLN
INTRAMUSCULAR | Status: DC | PRN
Start: 2022-01-13 — End: 2022-01-13
  Administered 2022-01-13: 4 mg via INTRAVENOUS

## 2022-01-13 MED ORDER — ZOLPIDEM TARTRATE 5 MG PO TABS: 5.0000 mg | ORAL_TABLET | Freq: Every evening | ORAL | Status: DC | PRN

## 2022-01-13 MED ORDER — ROPIVACAINE HCL 7.5 MG/ML IJ SOLN
INTRAMUSCULAR | Status: DC | PRN
  Administered 2022-01-13: 20 mL via PERINEURAL

## 2022-01-13 MED ORDER — OXYCODONE HCL 5 MG PO TABS: 5.0000 mg | ORAL_TABLET | ORAL | 0 refills | Status: AC | PRN

## 2022-01-13 MED ORDER — CEFAZOLIN SODIUM-DEXTROSE 2-4 GM/100ML-% IV SOLN
2.0000 g | Freq: Four times a day (QID) | INTRAVENOUS | Status: AC
  Administered 2022-01-13 (×2): 2 g via INTRAVENOUS
  Filled 2022-01-13 (×2): qty 100

## 2022-01-13 MED ORDER — DOCUSATE SODIUM 100 MG PO CAPS
100.0000 mg | ORAL_CAPSULE | Freq: Two times a day (BID) | ORAL | Status: DC
  Administered 2022-01-13 – 2022-01-14 (×2): 100 mg via ORAL
  Filled 2022-01-13 (×2): qty 1

## 2022-01-13 MED ORDER — CELECOXIB 200 MG PO CAPS
200.0000 mg | ORAL_CAPSULE | Freq: Once | ORAL | Status: AC
  Administered 2022-01-13: 200 mg via ORAL
  Filled 2022-01-13: qty 1

## 2022-01-13 MED ORDER — ISOPROPYL ALCOHOL 70 % SOLN
Status: AC
  Filled 2022-01-13: qty 480

## 2022-01-13 MED ORDER — 0.9 % SODIUM CHLORIDE (POUR BTL) OPTIME
TOPICAL | Status: DC | PRN
  Administered 2022-01-13: 1000 mL

## 2022-01-13 MED ORDER — ONDANSETRON HCL 4 MG/2ML IJ SOLN
INTRAMUSCULAR | Status: AC
  Filled 2022-01-13: qty 2

## 2022-01-13 MED ORDER — ASPIRIN 81 MG PO CHEW
81.0000 mg | CHEWABLE_TABLET | Freq: Two times a day (BID) | ORAL | Status: DC
  Administered 2022-01-13 – 2022-01-14 (×2): 81 mg via ORAL
  Filled 2022-01-13 (×2): qty 1

## 2022-01-13 MED ORDER — HYDROMORPHONE HCL 1 MG/ML IJ SOLN: 0.5000 mg | INTRAMUSCULAR | Status: DC | PRN

## 2022-01-13 MED ORDER — PROMETHAZINE HCL 25 MG/ML IJ SOLN: 6.2500 mg | INTRAMUSCULAR | Status: DC | PRN

## 2022-01-13 MED ORDER — VITAMIN D3 25 MCG (1000 UNIT) PO TABS
2000.0000 [IU] | ORAL_TABLET | Freq: Every day | ORAL | Status: DC
  Administered 2022-01-14: 2000 [IU] via ORAL
  Filled 2022-01-13 (×2): qty 2

## 2022-01-13 MED ORDER — FENTANYL CITRATE (PF) 100 MCG/2ML IJ SOLN
INTRAMUSCULAR | Status: DC | PRN
  Administered 2022-01-13: 25 ug via INTRAVENOUS
  Administered 2022-01-13 (×2): 50 ug via INTRAVENOUS

## 2022-01-13 MED ORDER — PROPOFOL 10 MG/ML IV BOLUS
INTRAVENOUS | Status: DC | PRN
  Administered 2022-01-13: 15 mg via INTRAVENOUS

## 2022-01-13 MED ORDER — PROMETHAZINE HCL 25 MG/ML IJ SOLN: 6.2500 mg | Freq: Once | INTRAMUSCULAR | Status: DC

## 2022-01-13 MED ORDER — PHENYLEPHRINE HCL-NACL 20-0.9 MG/250ML-% IV SOLN
INTRAVENOUS | Status: AC
  Filled 2022-01-13: qty 250

## 2022-01-13 MED ORDER — LACTATED RINGERS IV BOLUS: 250.0000 mL | Freq: Once | INTRAVENOUS | Status: DC

## 2022-01-13 MED ORDER — PROPOFOL 1000 MG/100ML IV EMUL
INTRAVENOUS | Status: AC
  Filled 2022-01-13: qty 100

## 2022-01-13 MED ORDER — HYDROMORPHONE HCL 1 MG/ML IJ SOLN: 0.2500 mg | INTRAMUSCULAR | Status: DC | PRN

## 2022-01-13 MED ORDER — LACTATED RINGERS IV SOLN: INTRAVENOUS | Status: DC

## 2022-01-13 SURGICAL SUPPLY — 62 items
BAG COUNTER SPONGE SURGICOUNT (BAG) ×1 IMPLANT
BLADE HEX COATED 2.75 (ELECTRODE) ×2 IMPLANT
BLADE SAG 18X100X1.27 (BLADE) ×2 IMPLANT
BLADE SAW SAG 35X64 .89 (BLADE) ×2 IMPLANT
BLADE SAW SGTL 13.0X1.19X90.0M (BLADE) ×1 IMPLANT
BNDG ELASTIC 6X10 VLCR STRL LF (GAUZE/BANDAGES/DRESSINGS) ×2 IMPLANT
BOWL SMART MIX CTS (DISPOSABLE) ×1 IMPLANT
CEMENT BONE R 1X40 (Cement) ×2 IMPLANT
CHLORAPREP W/TINT 26 (MISCELLANEOUS) ×4 IMPLANT
CLSR STERI-STRIP ANTIMIC 1/2X4 (GAUZE/BANDAGES/DRESSINGS) ×1 IMPLANT
COMP FEM PERSONA SZ8 RT (Joint) ×2 IMPLANT
COMPONENT FEM PERSONA SZ8 RT (Joint) IMPLANT
COVER SURGICAL LIGHT HANDLE (MISCELLANEOUS) ×2 IMPLANT
CUFF TOURN SGL QUICK 34 (TOURNIQUET CUFF) ×2
CUFF TRNQT CYL 34X4.125X (TOURNIQUET CUFF) ×1 IMPLANT
DERMABOND ADVANCED (GAUZE/BANDAGES/DRESSINGS) ×1
DERMABOND ADVANCED .7 DNX12 (GAUZE/BANDAGES/DRESSINGS) ×1 IMPLANT
DRAPE INCISE IOBAN 66X45 STRL (DRAPES) ×1 IMPLANT
DRAPE INCISE IOBAN 85X60 (DRAPES) ×2 IMPLANT
DRAPE SHEET LG 3/4 BI-LAMINATE (DRAPES) ×2 IMPLANT
DRAPE U-SHAPE 47X51 STRL (DRAPES) ×2 IMPLANT
DRESSING AQUACEL AG SP 3.5X10 (GAUZE/BANDAGES/DRESSINGS) ×1 IMPLANT
DRSG AQUACEL AG ADV 3.5X10 (GAUZE/BANDAGES/DRESSINGS) ×1 IMPLANT
DRSG AQUACEL AG SP 3.5X10 (GAUZE/BANDAGES/DRESSINGS) ×2
GAUZE 4X4 16PLY ~~LOC~~+RFID DBL (SPONGE) ×1 IMPLANT
GLOVE SRG 8 PF TXTR STRL LF DI (GLOVE) ×1 IMPLANT
GLOVE SURG ENC MOIS LTX SZ8 (GLOVE) ×4 IMPLANT
GLOVE SURG UNDER POLY LF SZ8 (GLOVE) ×2
GOWN STRL REUS W/TWL XL LVL3 (GOWN DISPOSABLE) ×2 IMPLANT
HANDPIECE INTERPULSE COAX TIP (DISPOSABLE) ×2
HDLS TROCR DRIL PIN KNEE 75 (PIN) ×2
HOOD PEEL AWAY FLYTE STAYCOOL (MISCELLANEOUS) ×6 IMPLANT
LINER TIB ASF PS CD/6-9 16 RT (Liner) ×1 IMPLANT
MANIFOLD NEPTUNE II (INSTRUMENTS) ×2 IMPLANT
MARKER SKIN DUAL TIP RULER LAB (MISCELLANEOUS) ×4 IMPLANT
NS IRRIG 1000ML POUR BTL (IV SOLUTION) ×2 IMPLANT
PACK TOTAL KNEE CUSTOM (KITS) ×2 IMPLANT
PIN DRILL HDLS TROCAR 75 4PK (PIN) IMPLANT
PROTECTOR NERVE ULNAR (MISCELLANEOUS) ×2 IMPLANT
SCREW HEADED 33MM KNEE (MISCELLANEOUS) ×2 IMPLANT
SCREW HEADED 48MM KNEE (MISCELLANEOUS) ×2 IMPLANT
SET HNDPC FAN SPRY TIP SCT (DISPOSABLE) ×1 IMPLANT
SOLUTION IRRIG SURGIPHOR (IV SOLUTION) ×2 IMPLANT
SPIKE FLUID TRANSFER (MISCELLANEOUS) ×2 IMPLANT
SPONGE T-LAP 18X18 ~~LOC~~+RFID (SPONGE) ×6 IMPLANT
STEM POLY PAT PLY 35M KNEE (Knees) ×1 IMPLANT
STEM TIB ST PERS 14+30 (Stem) ×1 IMPLANT
STEM TIBIA 5 DEG SZ D R KNEE (Knees) IMPLANT
SUT MNCRL AB 3-0 PS2 18 (SUTURE) ×2 IMPLANT
SUT STRATAFIX 0 PDS 27 VIOLET (SUTURE) ×2
SUT STRATAFIX PDO 1 14 VIOLET (SUTURE) ×2
SUT STRATFX PDO 1 14 VIOLET (SUTURE) ×1
SUT VIC AB 1 CT1 36 (SUTURE) ×1 IMPLANT
SUT VIC AB 2-0 CT2 27 (SUTURE) ×4 IMPLANT
SUTURE STRATFX 0 PDS 27 VIOLET (SUTURE) ×1 IMPLANT
SUTURE STRATFX PDO 1 14 VIOLET (SUTURE) ×1 IMPLANT
SYR 50ML LL SCALE MARK (SYRINGE) ×2 IMPLANT
TIBIA STEM 5 DEG SZ D R KNEE (Knees) ×2 IMPLANT
TRAY FOLEY MTR SLVR 14FR STAT (SET/KITS/TRAYS/PACK) ×1 IMPLANT
TRAY FOLEY MTR SLVR 16FR STAT (SET/KITS/TRAYS/PACK) IMPLANT
TUBE SUCTION HIGH CAP CLEAR NV (SUCTIONS) ×2 IMPLANT
WRAP KNEE MAXI GEL POST OP (GAUZE/BANDAGES/DRESSINGS) ×1 IMPLANT

## 2022-01-13 NOTE — Anesthesia Procedure Notes (Signed)
Spinal  Patient location during procedure: OR Start time: 01/13/2022 7:22 AM End time: 01/13/2022 7:27 AM Reason for block: surgical anesthesia Staffing Performed: anesthesiologist  Anesthesiologist: Nolon Nations, MD Preanesthetic Checklist Completed: patient identified, IV checked, site marked, risks and benefits discussed, surgical consent, monitors and equipment checked, pre-op evaluation and timeout performed Spinal Block Prep: DuraPrep and site prepped and draped Patient monitoring: heart rate, continuous pulse ox and blood pressure Approach: right paramedian Location: L3-4 Injection technique: single-shot Needle Needle type: Spinocan  Needle gauge: 25 G Needle length: 9 cm Additional Notes Expiration date of kit checked and confirmed. Patient tolerated procedure well, without complications.

## 2022-01-13 NOTE — Progress Notes (Signed)
Orthopedic Tech Progress Note Patient Details:  [name] 11/23/1951 [id number]  Ortho Devices Type of Ortho Device: Bone foam zero knee Ortho Device/Splint Location: RLE Ortho Device/Splint Interventions: Application   Post Interventions Patient Tolerated: Well  Linus Salmons Orville Mena 01/13/2022, 12:14 PM

## 2022-01-13 NOTE — Op Note (Signed)
DATE OF SURGERY:  01/13/2022 TIME: 7:14 AM  PATIENT NAME:  [name]   AGE: 71 y.o.    PRE-OPERATIVE DIAGNOSIS: End-stage right knee osteoarthritis, severe preoperative stiffness ROM: 10 to 45 degrees  POST-OPERATIVE DIAGNOSIS:  Same  PROCEDURE: Right Total Knee Arthroplasty  SURGEON:  Aldo Sondgeroth A Bona Hubbard, MD   ASSISTANT:  Izola Price, RNFA, present and scrubbed throughout the case, critical for assistance with exposure, retraction, instrumentation, and closure.   OPERATIVE IMPLANTS:  Zimmer persona PS femur size 8, D tibia baseplate with 30 mm stem extension, 35 mm patella, 68mm PS poly Implant Name Type Inv. Item Serial No. Manufacturer Lot No. LRB No. Used Action  CEMENT BONE R 1X40 - [id number] Cement CEMENT BONE R 1X40  ZIMMER RECON(ORTH,TRAU,BIO,SG) [id number] Right 2 Implanted  COMP FEM PERSONA SZ8 RT - [id number] Joint COMP FEM PERSONA SZ8 RT  ZIMMER RECON(ORTH,TRAU,BIO,SG) [id number] Right 1 Implanted  TIBIA STEM 5 DEG SZ D R KNEE - [id number] Knees TIBIA STEM 5 DEG SZ D R KNEE  ZIMMER RECON(ORTH,TRAU,BIO,SG) [id number] Right 1 Implanted  STEM POLY PAT PLY 52M KNEE - [id number] Knees STEM POLY PAT PLY 52M KNEE  ZIMMER RECON(ORTH,TRAU,BIO,SG) [id number] Right 1 Implanted  STEM TIB ST PERS 14+30 - [id number] Stem STEM TIB ST PERS 14+30  ZIMMER RECON(ORTH,TRAU,BIO,SG) [id number] Right 1 Implanted  vivacit-ehighly crosslinked polyethylene articular surface    ZIMMER KNEE [id number] Right 1 Implanted       PREOPERATIVE INDICATIONS:  [name] is a 71 y.o. year old female with end stage bone on bone degenerative arthritis of the knee who failed conservative treatment, including injections, antiinflammatories, activity modification, and assistive devices, and had significant impairment of their activities of daily living, and elected for Total Knee Arthroplasty.   The risks, benefits, and alternatives were discussed at length including but not limited to the risks of infection,  bleeding, nerve injury, stiffness, blood clots, the need for revision surgery, cardiopulmonary complications, among others, and they were willing to proceed.  OPERATIVE FINDINGS AND UNIQUE ASPECTS OF THE CASE: Severe preoperative stiffness, range of motion 10 to 45 degrees, , severe intra-articular osteophytosis and loose bodies, postoperative range of motion 0-120  ESTIMATED BLOOD LOSS: 50cc  OPERATIVE DESCRIPTION:   Once adequate anesthesia, preoperative antibiotics, 2 gm of ancef,1 gm of Tranexamic Acid, and 8 mg of Decadron administered, the patient was positioned supine with a right thigh tourniquet placed.  The right lower extremity was prepped and draped in sterile fashion.  A time-  out was performed identifying the patient, planned procedure, and the appropriate extremity.     The leg was  exsanguinated, tourniquet elevated to 250 mmHg.  A midline incision was made followed by median parapatellar arthrotomy.  Multiple large infrapatellar loose bodies were encountered and excised to allow for exposure of the medial compartment.  Anterior horn of the medial meniscus was released and resected. A medial release was performed. Large medial plateau osteophytes were resected with a rongeur.  the infrapatellar fat pad, which was mostly ossified, was resected with care taken to protect the patellar tendon. The suprapatellar fat was removed to exposed the distal anterior femur.  Large anterior osteophyte on the femur was resected with a rongeur. The anterior horn of the lateral meniscus and ACL were absent.  Following initial  exposure, attention was first to the femur.  The femoral   canal was opened with a drill, irrigated to try to prevent fat emboli.  An   intramedullary rod was passed set  at 5 degrees valgus, 10 mm. The distal femur was resected.  Following this resection, the tibia was   subluxated anteriorly.  Using the extramedullary guide, 10 mm of bone was resected off   the proximal lateral  tibia.  We confirmed the gap would be   stable medially and laterally with a size 10 spacer block as well as confirmed that the tibial cut was perpendicular in the coronal plane, checking with an alignment rod.    Once this was done, the posterior femoral referencing femoral sizer was placed under to the posterior condyles with 2 degrees of external rotational.  The femur was sized to be a size 8 in the anterior-  posterior dimension. The   anterior, posterior, and  chamfer cuts were made without difficulty nor   notching making certain that I was along the anterior cortex to help   with flexion gap stability.   Next a laminar spreader was placed with the knee in flexion and the medial lateral menisci were resected.  On the medial side behind the knee a large osseous loose body was appreciated.  This was removed piecemeal with care taken to protect the posterior capsule and neurovascular structures.  5 cc of the Exparel mixture was injected in the medial side of the back of the knee and 3 cc in the lateral side.  1/2 inch curved osteotome was used to resect posterior osteophyte that was then removed with a pituitary rongeur.       At this point, the tibia was sized to be a size D.  The size D tray was   then pinned in position. Trial reduction was now carried with a 8 femur,  D tibia, a 10 mm MC insert.  Given removal and resection of loose bodies and osteo fights the patient has significant hyperextension and laxity with medial lateral start.  With a 16 mm MC poly we had improved mediolateral balance and full extension without hyperextension, but still about 2 to 3 mm of laxity with medial lateral stress.  Elected to convert to a posterior stabilized bearing.  The femur was removed and a PS femoral trial was placed and we cut for the box.  PCL and notch osteophytes were fully resected.  Attention was next directed to the patella.  Precut  measurement was noted to be 23 mm.  I resected down to 14 mm  and used a  35 patellar button to restore patellar height as well as cover the cut surface.     The patella lug holes were drilled and a 35 mm patella poly trial was placed.    The knee was brought to full extension with good flexion stability with the patella   tracking through the trochlea without application of pressure.     Next the femoral component was again assessed and determined to be seated and appropriately lateralized.  The femoral lug holes were drilled.  The femoral component was then removed.Tibial component was again assessed and felt to be seated and appropriately rotated with the medial third of the tubercle. The tibia was then drilled, and keel punched.     Final components were  opened and regular cement was mixed.      Final implants were then  cemented onto cleaned and dried cut surfaces of bone with the knee brought to extension with a 16 mm PS poly.  The knee was irrigated with sterile Betadine diluted in saline as well as pulse lavage normal saline.  The  synovial lining was  then injected a dilute Exparel.      Once the cement had fully cured, excess cement was removed   throughout the knee.  I confirmed that I was satisfied with the range of   motion and stability, and the final 16 mm PS poly insert was chosen.  It was   placed into the knee.         The tourniquet had been let down.  No significant hemostasis was required.  The medial parapatellar arthrotomy was then reapproximated using #1 Stratafix sutures with the knee  in flexion.  The  remaining wound was closed with 0 stratafix, 2-0 Vicryl, and running 3-0 Monocryl.   The knee was cleaned, dried, dressed sterilely using Dermabond and   Aquacel dressing.  The patient was then  brought to recovery room in stable condition, tolerating the procedure  well. There were no complications.   Post op recs: WB: WBAT Abx: ancef x23 hours post op Imaging: PACU xrays DVT prophylaxis: Aspirin 81mg  BID x4 weeks Follow  up: 2 weeks after surgery for a wound check with Dr. Zachery Dakins at [facility].  Address: [address], [place], [place] [address]  Office Phone: [phone]  Charlies Constable, MD Orthopaedic Surgery

## 2022-01-13 NOTE — Anesthesia Postprocedure Evaluation (Signed)
Anesthesia Post Note  Patient: [name]  Procedure(s) Performed: TOTAL KNEE ARTHROPLASTY (Right: Knee)     Patient location during evaluation: PACU Anesthesia Type: Spinal Level of consciousness: awake and alert Pain management: pain level controlled Vital Signs Assessment: post-procedure vital signs reviewed and stable Respiratory status: spontaneous breathing Cardiovascular status: stable Anesthetic complications: no   No notable events documented.  Last Vitals:  Vitals:   01/13/22 1400 01/13/22 1500  BP: 113/64 122/73  Pulse: 69 73  Resp: 16 18  Temp:    SpO2: 93% 92%    Last Pain:  Vitals:   01/13/22 1500  TempSrc:   PainSc: 0-No pain                 Nolon Nations

## 2022-01-13 NOTE — Transfer of Care (Signed)
Immediate Anesthesia Transfer of Care Note  Patient: [name]  Procedure(s) Performed: TOTAL KNEE ARTHROPLASTY (Right: Knee)  Patient Location: PACU  Anesthesia Type:Spinal  Level of Consciousness: awake and patient cooperative  Airway & Oxygen Therapy: Patient Spontanous Breathing and Patient connected to face mask  Post-op Assessment: Report given to RN and Post -op Vital signs reviewed and stable  Post vital signs: Reviewed and stable  Last Vitals:  Vitals Value Taken Time  BP 98/55 01/13/22 1037  Temp 36.9 C 01/13/22 1035  Pulse 71 01/13/22 1038  Resp 14 01/13/22 1038  SpO2 99 % 01/13/22 1038  Vitals shown include unvalidated device data.  Last Pain:  Vitals:   01/13/22 0605  TempSrc: Oral  PainSc:          Complications: No notable events documented.

## 2022-01-13 NOTE — Progress Notes (Signed)
° ° ° °  Subjective:  Patient reports pain as well controlled. Daughter at bedside. Did well with PT this afternoon but got a little bit light headed. Plan for DC home likely tomorrow after PT. Notes numbness in the heel of the R foot but otherwise intact sensation and motor function.  Objective:   VITALS:   Vitals:   01/13/22 1400 01/13/22 1500 01/13/22 1600 01/13/22 1639  BP: 113/64 122/73 102/82 (!) 114/59  Pulse: 69 73 61 77  Resp: 16 18 16    Temp:    98 F (36.7 C)  TempSrc:    Oral  SpO2: 93% 92% 92% 97%  Weight:        Sensation intact distally plantar/dorsal aspect of the foot. Numbness only at the base of the heel. Intact pulses distally Dorsiflexion/Plantar flexion intact Incision: dressing C/D/I Compartment soft   Lab Results  Component Value Date   WBC 8.9 12/22/2021   HGB 13.4 12/22/2021   HCT 41.0 12/22/2021   MCV 84.2 12/22/2021   PLT 208 12/22/2021   BMET    Component Value Date/Time   NA 140 12/22/2021 1112   K 3.7 12/22/2021 1112   CL 104 12/22/2021 1112   CO2 29 12/22/2021 1112   GLUCOSE 96 12/22/2021 1112   BUN 18 12/22/2021 1112   CREATININE 0.80 12/22/2021 1112   CREATININE 0.73 12/19/2021 1159   CALCIUM 10.3 12/22/2021 1112   EGFR 88 12/19/2021 1159   GFRNONAA >60 12/22/2021 1112   GFRNONAA 65 03/10/2021 1122      Xray: post op xrays shows R TKA components in good position without fx or adverse features  Assessment/Plan: Day of Surgery   Principal Problem:   S/P total knee arthroplasty, right 01/13/22  Post op recs: WB: WBAT Abx: ancef x23 hours post op Imaging: PACU xrays DVT prophylaxis: Aspirin 81mg  BID x4 weeks Follow up: 2 weeks after surgery for a wound check with Dr. Zachery Dakins at [facility].  Address: [address] [place], [place], [place] [address]  Office Phone: [phone]  Willaim Sheng 01/13/2022, 5:41 PM   Charlies Constable, MD  Contact information:   [phone] 7am-5pm epic message Dr.  Zachery Dakins, or call office for patient follow up: [phone] After hours and holidays please check [email or web address] for group call information for [facility]

## 2022-01-13 NOTE — Anesthesia Procedure Notes (Signed)
Procedure Name: MAC Date/Time: 01/13/2022 7:30 AM Performed by: Claudia Desanctis, CRNA Pre-anesthesia Checklist: Patient identified, Emergency Drugs available, Suction available and Patient being monitored Patient Re-evaluated:Patient Re-evaluated prior to induction Oxygen Delivery Method: Simple face mask

## 2022-01-13 NOTE — Anesthesia Procedure Notes (Signed)
Anesthesia Regional Block: Adductor canal block   Pre-Anesthetic Checklist: , timeout performed,  Correct Patient, Correct Site, Correct Laterality,  Correct Procedure, Correct Position, site marked,  Risks and benefits discussed,  Surgical consent,  Pre-op evaluation,  At surgeon's request and post-op pain management  Laterality: Lower and Right  Prep: chloraprep       Needles:  Injection technique: Single-shot  Needle Type: Stimiplex     Needle Length: 9cm  Needle Gauge: 21     Additional Needles:   Procedures:,,,, ultrasound used (permanent image in chart),,    Narrative:  Start time: 01/13/2022 6:52 AM End time: 01/13/2022 7:12 AM Injection made incrementally with aspirations every 5 mL.  Performed by: Personally  Anesthesiologist: Nolon Nations, MD  Additional Notes: BP cuff, EKG monitors applied. Sedation begun. Artery and nerve location verified with ultrasound. Anesthetic injected incrementally (59ml), slowly, and after negative aspirations under direct u/s guidance. Good fascial/perineural spread. Tolerated well.

## 2022-01-13 NOTE — Evaluation (Signed)
Physical Therapy Evaluation Patient Details Name: [name] MRN: [id number] DOB: 10/11/1951 Today's Date: 01/13/2022  History of Present Illness  Pt s/p R TKR and with hx of L TKR, PAC and obesity  Clinical Impression  Pt s/p R TKR and presenting with decreased R LE strength/ROM and post op pain limiting functional mobility.  This date, pt performed HEP with written instruction provided and up to ambulate short distance to sit in chair - distance ltd by fatigue and pt report of numbness in heels and mild R knee buckling.  Pt should progress to dc home with family assist and reports first OP PT scheduled for 01/16/22.     Recommendations for follow up therapy are one component of a multi-disciplinary discharge planning process, led by the attending physician.  Recommendations may be updated based on patient status, additional functional criteria and insurance authorization.  Follow Up Recommendations Follow physician's recommendations for discharge plan and follow up therapies    Assistance Recommended at Discharge Frequent or constant Supervision/Assistance  Patient can return home with the following  A little help with walking and/or transfers;A little help with bathing/dressing/bathroom;Assistance with cooking/housework;Assist for transportation;Help with stairs or ramp for entrance    Equipment Recommendations None recommended by PT  Recommendations for Other Services       Functional Status Assessment Patient has had a recent decline in their functional status and demonstrates the ability to make significant improvements in function in a reasonable and predictable amount of time.     Precautions / Restrictions Precautions Precautions: Knee;Fall Restrictions Weight Bearing Restrictions: No Other Position/Activity Restrictions: WBAT      Mobility  Bed Mobility Overal bed mobility: Needs Assistance Bed Mobility: Supine to Sit     Supine to sit: Min assist     General  bed mobility comments: cues for sequence and use of L LE to self assist;  Physical assist to manage R LE    Transfers Overall transfer level: Needs assistance Equipment used: Rolling walker (2 wheels) Transfers: Sit to/from Stand Sit to Stand: Min assist, From elevated surface           General transfer comment: cues for LE management and use of UEs to self assist    Ambulation/Gait Ambulation/Gait assistance: Min assist Gait Distance (Feet): 10 Feet Assistive device: Rolling walker (2 wheels) Gait Pattern/deviations: Step-to pattern, Decreased step length - right, Decreased step length - left, Shuffle, Trunk flexed Gait velocity: decr     General Gait Details: cues for sequence, posture and position from RW; physical assist for balance/support and RW management; mild buckling noted at R knee  Stairs            Wheelchair Mobility    Modified Rankin (Stroke Patients Only)       Balance Overall balance assessment: Needs assistance Sitting-balance support: No upper extremity supported, Feet supported Sitting balance-Leahy Scale: Good     Standing balance support: Bilateral upper extremity supported Standing balance-Leahy Scale: Poor                               Pertinent Vitals/Pain Pain Assessment Pain Assessment: 0-10 Pain Score: 2  Pain Location: R knee Pain Descriptors / Indicators: Aching, Sore Pain Intervention(s): Limited activity within patient's tolerance, Monitored during session, Premedicated before session    Home Living Family/patient expects to be discharged to:: Private residence Living Arrangements: Spouse/significant other Available Help at Discharge: Family Type of Home: House  Home Access: Stairs to enter Entrance Stairs-Rails: Right;Left Entrance Stairs-Number of Steps: 4+1   Home Layout: One level Home Equipment: Conservation officer, nature (2 wheels);Cane - single point;Cane - quad;BSC/3in1      Prior Function Prior Level of  Function : Independent/Modified Independent                     Journalist, newspaper        Extremity/Trunk Assessment   Upper Extremity Assessment Upper Extremity Assessment: Overall WFL for tasks assessed    Lower Extremity Assessment Lower Extremity Assessment: RLE deficits/detail RLE Deficits / Details: AAROM at knee 0 - 75 with 2+/5 quads    Cervical / Trunk Assessment Cervical / Trunk Assessment: Normal  Communication   Communication: No difficulties  Cognition Arousal/Alertness: Awake/alert Behavior During Therapy: WFL for tasks assessed/performed Overall Cognitive Status: Within Functional Limits for tasks assessed                                          General Comments      Exercises Total Joint Exercises Ankle Circles/Pumps: AROM, Both, 15 reps, Supine Quad Sets: AROM, Both, 10 reps, Supine Heel Slides: AAROM, Right, 10 reps, Supine Straight Leg Raises: AAROM, Right, 10 reps, Supine   Assessment/Plan    PT Assessment Patient needs continued PT services  PT Problem List Decreased strength;Decreased range of motion;Decreased activity tolerance;Decreased balance;Decreased mobility;Decreased knowledge of use of DME;Pain       PT Treatment Interventions DME instruction;Gait training;Stair training;Functional mobility training;Therapeutic activities;Therapeutic exercise;Balance training;Patient/family education    PT Goals (Current goals can be found in the Care Plan section)  Acute Rehab PT Goals Patient Stated Goal: Regain IND PT Goal Formulation: With patient Time For Goal Achievement: 01/20/22 Potential to Achieve Goals: Good    Frequency 7X/week     Co-evaluation               AM-PAC PT "6 Clicks" Mobility  Outcome Measure Help needed turning from your back to your side while in a flat bed without using bedrails?: A Little Help needed moving from lying on your back to sitting on the side of a flat bed without using  bedrails?: A Little Help needed moving to and from a bed to a chair (including a wheelchair)?: A Little Help needed standing up from a chair using your arms (e.g., wheelchair or bedside chair)?: A Little Help needed to walk in hospital room?: Total Help needed climbing 3-5 steps with a railing? : Total 6 Click Score: 14    End of Session Equipment Utilized During Treatment: Gait belt Activity Tolerance: Patient tolerated treatment well;Patient limited by fatigue Patient left: in chair;with call bell/phone within reach;with family/visitor present Nurse Communication: Mobility status PT Visit Diagnosis: Difficulty in walking, not elsewhere classified (R26.2);Unsteadiness on feet (R26.81)    Time: 1235-1310 PT Time Calculation (min) (ACUTE ONLY): 35 min   Charges:   PT Evaluation $PT Eval Low Complexity: 1 Low PT Treatments $Therapeutic Exercise: 8-22 mins        Debe Coder PT Acute Rehabilitation Services Pager [phone] Office [phone]   Sherriann Szuch 01/13/2022, 1:26 PM

## 2022-01-13 NOTE — Discharge Instructions (Signed)
INSTRUCTIONS AFTER JOINT REPLACEMENT  ° °Remove items at home which could result in a fall. This includes throw rugs or furniture in walking pathways °ICE to the affected joint every three hours while awake for 30 minutes at a time, for at least the first 3-5 days, and then as needed for pain and swelling.  Continue to use ice for pain and swelling. You may notice swelling that will progress down to the foot and ankle.  This is normal after surgery.  Elevate your leg when you are not up walking on it.   °Continue to use the breathing machine you got in the hospital (incentive spirometer) which will help keep your temperature down.  It is common for your temperature to cycle up and down following surgery, especially at night when you are not up moving around and exerting yourself.  The breathing machine keeps your lungs expanded and your temperature down. ° ° °DIET:  As you were doing prior to hospitalization, we recommend a well-balanced diet. ° °DRESSING / WOUND CARE / SHOWERING ° °Keep the surgical dressing until follow up.  The dressing is water proof, so you can shower without any extra covering.  IF THE DRESSING FALLS OFF or the wound gets wet inside, change the dressing with sterile gauze.  Please use good hand washing techniques before changing the dressing.  Do not use any lotions or creams on the incision until instructed by your surgeon.   ° °ACTIVITY ° °Increase activity slowly as tolerated, but follow the weight bearing instructions below.   °No driving for 6 weeks or until further direction given by your physician.  You cannot drive while taking narcotics.  °No lifting or carrying greater than 10 lbs. until further directed by your surgeon. °Avoid periods of inactivity such as sitting longer than an hour when not asleep. This helps prevent blood clots.  °You may return to work once you are authorized by your doctor.  ° ° ° °WEIGHT BEARING  ° °Weight bearing as tolerated with assist device (walker, cane,  etc) as directed, use it as long as suggested by your surgeon or therapist, typically at least 4-6 weeks. ° ° °EXERCISES ° °Results after joint replacement surgery are often greatly improved when you follow the exercise, range of motion and muscle strengthening exercises prescribed by your doctor. Safety measures are also important to protect the joint from further injury. Any time any of these exercises cause you to have increased pain or swelling, decrease what you are doing until you are comfortable again and then slowly increase them. If you have problems or questions, call your caregiver or physical therapist for advice.  ° °Rehabilitation is important following a joint replacement. After just a few days of immobilization, the muscles of the leg can become weakened and shrink (atrophy).  These exercises are designed to build up the tone and strength of the thigh and leg muscles and to improve motion. Often times heat used for twenty to thirty minutes before working out will loosen up your tissues and help with improving the range of motion but do not use heat for the first two weeks following surgery (sometimes heat can increase post-operative swelling).  ° °These exercises can be done on a training (exercise) mat, on the floor, on a table or on a bed. Use whatever works the best and is most comfortable for you.    Use music or television while you are exercising so that the exercises are a pleasant break in your   day. This will make your life better with the exercises acting as a break in your routine that you can look forward to.   Perform all exercises about fifteen times, three times per day or as directed.  You should exercise both the operative leg and the other leg as well.  Exercises include:   Quad Sets - Tighten up the muscle on the front of the thigh (Quad) and hold for 5-10 seconds.   Straight Leg Raises - With your knee straight (if you were given a brace, keep it on), lift the leg to 60  degrees, hold for 3 seconds, and slowly lower the leg.  Perform this exercise against resistance later as your leg gets stronger.  Leg Slides: Lying on your back, slowly slide your foot toward your buttocks, bending your knee up off the floor (only go as far as is comfortable). Then slowly slide your foot back down until your leg is flat on the floor again.  Angel Wings: Lying on your back spread your legs to the side as far apart as you can without causing discomfort.  Hamstring Strength:  Lying on your back, push your heel against the floor with your leg straight by tightening up the muscles of your buttocks.  Repeat, but this time bend your knee to a comfortable angle, and push your heel against the floor.  You may put a pillow under the heel to make it more comfortable if necessary.   A rehabilitation program following joint replacement surgery can speed recovery and prevent re-injury in the future due to weakened muscles. Contact your doctor or a physical therapist for more information on knee rehabilitation.    CONSTIPATION  Constipation is defined medically as fewer than three stools per week and severe constipation as less than one stool per week.  Even if you have a regular bowel pattern at home, your normal regimen is likely to be disrupted due to multiple reasons following surgery.  Combination of anesthesia, postoperative narcotics, change in appetite and fluid intake all can affect your bowels.   YOU MUST use at least one of the following options; they are listed in order of increasing strength to get the job done.  They are all available over the counter, and you may need to use some, POSSIBLY even all of these options:    Drink plenty of fluids (prune juice may be helpful) and high fiber foods Colace 100 mg by mouth twice a day  Senokot for constipation as directed and as needed Dulcolax (bisacodyl), take with full glass of water  Miralax (polyethylene glycol) once or twice a day as  needed.  If you have tried all these things and are unable to have a bowel movement in the first 3-4 days after surgery call either your surgeon or your primary doctor.    If you experience loose stools or diarrhea, hold the medications until you stool forms back up.  If your symptoms do not get better within 1 week or if they get worse, check with your doctor.  If you experience "the worst abdominal pain ever" or develop nausea or vomiting, please contact the office immediately for further recommendations for treatment.   ITCHING:  If you experience itching with your medications, try taking only a single pain pill, or even half a pain pill at a time.  You can also use Benadryl over the counter for itching or also to help with sleep.   TED HOSE STOCKINGS:  Use stockings on both  legs until for at least 2 weeks or as directed by physician office. They may be removed at night for sleeping.  MEDICATIONS:  See your medication summary on the "After Visit Summary" that nursing will review with you.  You may have some home medications which will be placed on hold until you complete the course of blood thinner medication.  It is important for you to complete the blood thinner medication as prescribed.   Blood clot prevention (DVT Prophylaxis): After surgery you are at an increased risk for a blood clot. you were prescribed a blood thinner, Aspirin 81mg, to be taken twice daily for a total of 4 weeks from surgery to help reduce your risk of getting a blood clot. This will help prevent a blood clot. Signs of a pulmonary embolus (blood clot in the lungs) include sudden short of breath, feeling lightheaded or dizzy, chest pain with a deep breath, rapid pulse rapid breathing. Signs of a blood clot in your arms or legs include new unexplained swelling and cramping, warm, red or darkened skin around the painful area. Please call the office or 911 right away if these signs or symptoms develop.  PRECAUTIONS:  If you  experience chest pain or shortness of breath - call 911 immediately for transfer to the hospital emergency department.   If you develop a fever greater that 101 F, purulent drainage from wound, increased redness or drainage from wound, foul odor from the wound/dressing, or calf pain - CONTACT YOUR SURGEON.                                                   FOLLOW-UP APPOINTMENTS:  If you do not already have a post-op appointment, please call the office for an appointment to be seen by your surgeon.  Guidelines for how soon to be seen are listed in your "After Visit Summary", but are typically between 2-3 weeks after surgery.  OTHER INSTRUCTIONS:   Knee Replacement:  Do not place pillow under knee, focus on keeping the knee straight while resting. CPM instructions: 0-90 degrees, 2 hours in the morning, 2 hours in the afternoon, and 2 hours in the evening. Place foam block, curve side up under heel at all times except when in CPM or when walking.  DO NOT modify, tear, cut, or change the foam block in any way.  POST-OPERATIVE OPIOID TAPER INSTRUCTIONS: It is important to wean off of your opioid medication as soon as possible. If you do not need pain medication after your surgery it is ok to stop day one. Opioids include: Codeine, Hydrocodone(Norco, Vicodin), Oxycodone(Percocet, oxycontin) and hydromorphone amongst others.  Long term and even short term use of opiods can cause: Increased pain response Dependence Constipation Depression Respiratory depression And more.  Withdrawal symptoms can include Flu like symptoms Nausea, vomiting And more Techniques to manage these symptoms Hydrate well Eat regular healthy meals Stay active Use relaxation techniques(deep breathing, meditating, yoga) Do Not substitute Alcohol to help with tapering If you have been on opioids for less than two weeks and do not have pain than it is ok to stop all together.  Plan to wean off of opioids This plan should  start within one week post op of your joint replacement. Maintain the same interval or time between taking each dose and first decrease the dose.  Cut the   total daily intake of opioids by one tablet each day Next start to increase the time between doses. The last dose that should be eliminated is the evening dose.   MAKE SURE YOU:  Understand these instructions.  Get help right away if you are not doing well or get worse.    Thank you for letting us be a part of your medical care team.  It is a privilege we respect greatly.  We hope these instructions will help you stay on track for a fast and full recovery!        

## 2022-01-13 NOTE — Interval H&P Note (Signed)
The patient has been re-examined, and the chart reviewed, and there have been no interval changes to the documented history and physical.    The operative side was examined and the patient was confirmed to have. Sens DPN, SPN, TN intact, Motor EHL, ext, flex 5/5, and DP 2+, PT 2+, No significant edema.  The risks benefits and alternatives were discussed with the patient including but not limited to the risks of nonoperative treatment, versus surgical intervention including infection, bleeding, stiffness, leg length discrepancy, nerve injury, the need for revision surgery, hardware prominence, hardware failure, the need for hardware removal, blood clots, cardiopulmonary complications, morbidity, mortality, among others, and they were willing to proceed.  Consent was signed by myself and the patient.  Right knee was marked.    Charlies Constable, MD Orthopaedic Surgery

## 2022-01-14 DIAGNOSIS — M1711 Unilateral primary osteoarthritis, right knee: Secondary | ICD-10-CM | POA: Diagnosis not present

## 2022-01-14 DIAGNOSIS — I10 Essential (primary) hypertension: Secondary | ICD-10-CM | POA: Diagnosis not present

## 2022-01-14 DIAGNOSIS — Z96652 Presence of left artificial knee joint: Secondary | ICD-10-CM | POA: Diagnosis not present

## 2022-01-14 DIAGNOSIS — M25661 Stiffness of right knee, not elsewhere classified: Secondary | ICD-10-CM | POA: Diagnosis not present

## 2022-01-14 DIAGNOSIS — Z853 Personal history of malignant neoplasm of breast: Secondary | ICD-10-CM | POA: Diagnosis not present

## 2022-01-14 LAB — BASIC METABOLIC PANEL
Anion gap: 7 (ref 5–15)
BUN: 19 mg/dL (ref 8–23)
CO2: 26 mmol/L (ref 22–32)
Calcium: 8.6 mg/dL — ABNORMAL LOW (ref 8.9–10.3)
Chloride: 100 mmol/L (ref 98–111)
Creatinine, Ser: 0.85 mg/dL (ref 0.44–1.00)
GFR, Estimated: >60 mL/min (ref >60)
Glucose, Bld: 145 mg/dL — ABNORMAL HIGH (ref 70–99)
Potassium: 3.5 mmol/L (ref 3.5–5.1)
Sodium: 133 mmol/L — ABNORMAL LOW (ref 135–145)

## 2022-01-14 LAB — CBC
HCT: 33 % — ABNORMAL LOW (ref 36.0–46.0)
Hemoglobin: 10.7 g/dL — ABNORMAL LOW (ref 12.0–15.0)
MCH: 28.1 pg (ref 26.0–34.0)
MCHC: 32.4 g/dL (ref 30.0–36.0)
MCV: 86.6 fL (ref 80.0–100.0)
Platelets: 185 10*3/uL (ref 150–400)
RBC: 3.81 MIL/uL — ABNORMAL LOW (ref 3.87–5.11)
RDW: 13.2 % (ref 11.5–15.5)
WBC: 11 10*3/uL — ABNORMAL HIGH (ref 4.0–10.5)
nRBC: 0 % (ref 0.0–0.2)

## 2022-01-14 NOTE — Progress Notes (Signed)
Physical Therapy Treatment Patient Details Name: [name] MRN: [id number] DOB: 1951/11/09 Today's Date: 01/14/2022   History of Present Illness Pt s/p R TKR and with hx of L TKR, PAC and obesity    PT Comments    Pt very cooperative and with marked improvement in activity tolerance.  Pt performed therex program, ambulated in hall and negotiated stairs.  Written instruction provided.  Pt eager for dc home.   Recommendations for follow up therapy are one component of a multi-disciplinary discharge planning process, led by the attending physician.  Recommendations may be updated based on patient status, additional functional criteria and insurance authorization.  Follow Up Recommendations  Follow physician's recommendations for discharge plan and follow up therapies     Assistance Recommended at Discharge Frequent or constant Supervision/Assistance  Patient can return home with the following A little help with walking and/or transfers;A little help with bathing/dressing/bathroom;Assistance with cooking/housework;Assist for transportation;Help with stairs or ramp for entrance   Equipment Recommendations  None recommended by PT    Recommendations for Other Services       Precautions / Restrictions Precautions Precautions: Knee;Fall Restrictions Weight Bearing Restrictions: No RLE Weight Bearing: Weight bearing as tolerated     Mobility  Bed Mobility Overal bed mobility: Needs Assistance Bed Mobility: Supine to Sit     Supine to sit: Supervision     General bed mobility comments: no physical assist    Transfers Overall transfer level: Needs assistance Equipment used: Rolling walker (2 wheels) Transfers: Sit to/from Stand Sit to Stand: Min guard, Supervision           General transfer comment: cues for LE management and use of UEs to self assist    Ambulation/Gait Ambulation/Gait assistance: Min guard, Supervision Gait Distance (Feet): 100 Feet Assistive  device: Rolling walker (2 wheels) Gait Pattern/deviations: Step-to pattern, Decreased step length - right, Decreased step length - left, Shuffle, Trunk flexed Gait velocity: decr     General Gait Details: min cues for sequence, posture and position from RW   Stairs Stairs: Yes Stairs assistance: Min assist Stair Management: One rail Right, Step to pattern, Forwards, With walker Number of Stairs: 4 General stair comments: single step fwd and bkwd; 2 step with rail and QC; cues for sequence and foot/QC placement   Wheelchair Mobility    Modified Rankin (Stroke Patients Only)       Balance Overall balance assessment: Needs assistance Sitting-balance support: No upper extremity supported, Feet supported Sitting balance-Leahy Scale: Good     Standing balance support: Bilateral upper extremity supported Standing balance-Leahy Scale: Poor                              Cognition Arousal/Alertness: Awake/alert Behavior During Therapy: WFL for tasks assessed/performed Overall Cognitive Status: Within Functional Limits for tasks assessed                                          Exercises Total Joint Exercises Ankle Circles/Pumps: AROM, Both, 15 reps, Supine Quad Sets: AROM, Both, 10 reps, Supine Heel Slides: AAROM, Right, Supine, 15 reps Straight Leg Raises: AAROM, Right, 10 reps, Supine    General Comments        Pertinent Vitals/Pain Pain Assessment Pain Assessment: 0-10 Pain Score: 3  Pain Location: R knee Pain Descriptors / Indicators: Aching, Sore Pain  Intervention(s): Limited activity within patient's tolerance, Monitored during session, Premedicated before session, Ice applied    Home Living                          Prior Function            PT Goals (current goals can now be found in the care plan section) Acute Rehab PT Goals Patient Stated Goal: Regain IND PT Goal Formulation: With patient Time For Goal  Achievement: 01/20/22 Potential to Achieve Goals: Good Progress towards PT goals: Progressing toward goals    Frequency    7X/week      PT Plan Current plan remains appropriate    Co-evaluation              AM-PAC PT "6 Clicks" Mobility   Outcome Measure  Help needed turning from your back to your side while in a flat bed without using bedrails?: A Little Help needed moving from lying on your back to sitting on the side of a flat bed without using bedrails?: A Little Help needed moving to and from a bed to a chair (including a wheelchair)?: A Little Help needed standing up from a chair using your arms (e.g., wheelchair or bedside chair)?: A Little Help needed to walk in hospital room?: A Little Help needed climbing 3-5 steps with a railing? : A Little 6 Click Score: 18    End of Session Equipment Utilized During Treatment: Gait belt Activity Tolerance: Patient tolerated treatment well Patient left: in chair;with call bell/phone within reach;with chair alarm set Nurse Communication: Mobility status PT Visit Diagnosis: Difficulty in walking, not elsewhere classified (R26.2);Unsteadiness on feet (R26.81)     Time: 2956-2130 PT Time Calculation (min) (ACUTE ONLY): 32 min  Charges:  $Gait Training: 8-22 mins $Therapeutic Exercise: 8-22 mins                     [place] Pager [phone] Office [phone]    Aissa Lisowski 01/14/2022, 11:51 AM

## 2022-01-14 NOTE — Progress Notes (Signed)
The patient is alert and oriented and has been seen by her physician. The orders for discharge were written. IV has been removed. Went over discharge instructions with patient and family. She is being discharged via wheelchair with all of her belongings.  

## 2022-01-14 NOTE — Progress Notes (Signed)
° ° ° °  Subjective:  Patient reports pain as well controlled. Daughter at bedside. Feels better today. Was lightheaded with therapy yesterday. Plan for DC home today after PT if does well. Continues to note numbness in the heel of the R foot but otherwise intact sensation and motor function.  Objective:   VITALS:   Vitals:   01/13/22 1639 01/13/22 2142 01/14/22 0221 01/14/22 0526  BP: (!) 114/59 138/70 122/71 (!) 121/56  Pulse: 77 80 67 63  Resp:  16 17 16   Temp: 98 F (36.7 C) 99.1 F (37.3 C) 98.7 F (37.1 C) 97.8 F (36.6 C)  TempSrc: Oral Oral Oral   SpO2: 97% 94% 96% 96%  Weight:        Sensation intact distally plantar/dorsal aspect of the foot. Numbness only at the base of the heel. Intact pulses distally Dorsiflexion/Plantar flexion intact Incision: dressing C/D/I Compartment soft   Lab Results  Component Value Date   WBC 11.0 (H) 01/14/2022   HGB 10.7 (L) 01/14/2022   HCT 33.0 (L) 01/14/2022   MCV 86.6 01/14/2022   PLT 185 01/14/2022   BMET    Component Value Date/Time   NA 133 (L) 01/14/2022 0308   K 3.5 01/14/2022 0308   CL 100 01/14/2022 0308   CO2 26 01/14/2022 0308   GLUCOSE 145 (H) 01/14/2022 0308   BUN 19 01/14/2022 0308   CREATININE 0.85 01/14/2022 0308   CREATININE 0.80 12/22/2021 1112   CREATININE 0.73 12/19/2021 1159   CALCIUM 8.6 (L) 01/14/2022 0308   EGFR 88 12/19/2021 1159   GFRNONAA >60 01/14/2022 0308   GFRNONAA >60 12/22/2021 1112   GFRNONAA 65 03/10/2021 1122      Xray: post op xrays shows R TKA components in good position without fx or adverse features  Assessment/Plan: 1 Day Post-Op   Principal Problem:   S/P total knee arthroplasty, right 01/13/22  Post op recs: WB: WBAT Abx: ancef x23 hours post op Imaging: PACU xrays DVT prophylaxis: Aspirin 81mg  BID x4 weeks Follow up: 2 weeks after surgery for a wound check with Dr. Zachery Dakins at [facility].  Address: [address] [place], [place], [place] [address]   Office Phone: [phone]  Plan to discharge home today if passes PT.   [place] 01/14/2022, 8:43 AM   Contact information:   After hours and holidays please check [email or web address] for group call information for [facility]

## 2022-01-14 NOTE — TOC Transition Note (Signed)
Transition of Care [facility]) - CM/SW Discharge Note   Patient Details  Name: [name] MRN: [id number] Date of Birth: 08-30-1951  [facility] CM/SW Contact:  Ross Ludwig, LCSW Phone Number: 01/14/2022, 10:03 AM   Clinical Narrative:     Patient will be going home and will be going to outpatient PT which was prearranged.  CSW signing off please reconsult with any other social work needs.    Discharge Placement  Discharging back home going to outpatient PT.                     Discharge Plan and Services                                     Social Determinants of Health (SDOH) Interventions     Readmission Risk Interventions No flowsheet data found.

## 2022-01-14 NOTE — Plan of Care (Signed)
  Problem: Pain Management: Goal: Pain level will decrease with appropriate interventions Outcome: Progressing   

## 2022-01-16 ENCOUNTER — Encounter (HOSPITAL_COMMUNITY): Payer: Self-pay | Admitting: Orthopedic Surgery

## 2022-01-16 DIAGNOSIS — M6281 Muscle weakness (generalized): Secondary | ICD-10-CM | POA: Diagnosis not present

## 2022-01-16 DIAGNOSIS — M1711 Unilateral primary osteoarthritis, right knee: Secondary | ICD-10-CM | POA: Diagnosis not present

## 2022-01-16 DIAGNOSIS — Z96651 Presence of right artificial knee joint: Secondary | ICD-10-CM | POA: Diagnosis not present

## 2022-01-16 DIAGNOSIS — M25561 Pain in right knee: Secondary | ICD-10-CM | POA: Diagnosis not present

## 2022-01-16 DIAGNOSIS — M25661 Stiffness of right knee, not elsewhere classified: Secondary | ICD-10-CM | POA: Diagnosis not present

## 2022-01-17 NOTE — Discharge Summary (Signed)
Physician Discharge Summary  Patient ID: [name] MRN: [id number] DOB/AGE: 01/09/1951 71 y.o.  Admit date: 01/13/2022 Discharge date: 01/17/2022  Admission Diagnoses:  S/P total knee arthroplasty, right  Discharge Diagnoses:  Principal Problem:   S/P total knee arthroplasty, right   Past Medical History:  Diagnosis Date   Arthritis    Family history of bone cancer    Family history of colon cancer    Family history of leukemia    Family history of lung cancer    Family history of melanoma    Family history of ovarian cancer    Family history of stomach cancer    Family history of uterine cancer    History of kidney stones    Hyperlipemia    Hypertension    Invasive ductal carcinoma of breast ([place])    right    radiation only   Lower extremity edema    Obesity    PAC (premature atrial contraction)     Surgeries: Procedure(s): TOTAL KNEE ARTHROPLASTY on 01/13/2022   Consultants (if any):   Discharged Condition: Improved  Hospital Course: [name] is an 71 y.o. female who was admitted 01/13/2022 with a diagnosis of S/P total knee arthroplasty, right and went to the operating room on 01/13/2022 and underwent the above named procedures.    She was given perioperative antibiotics:  Anti-infectives (From admission, onward)    Start     Dose/Rate Route Frequency Ordered Stop   01/13/22 1330  ceFAZolin (ANCEF) IVPB 2g/100 mL premix        2 g 200 mL/hr over 30 Minutes Intravenous Every 6 hours 01/13/22 1044 01/13/22 2341   01/13/22 0600  ceFAZolin (ANCEF) IVPB 2g/100 mL premix        2 g 200 mL/hr over 30 Minutes Intravenous On call to O.R. 01/13/22 0518 01/13/22 0729     .  She was given sequential compression devices, early ambulation, and aspirin for DVT prophylaxis.  She benefited maximally from the hospital stay and there were no complications.    Recent vital signs:  Vitals:   01/14/22 0526 01/14/22 0920  BP: (!) 121/56 107/76  Pulse: 63 66  Resp:  16 18  Temp: 97.8 F (36.6 C) 98.1 F (36.7 C)  SpO2: 96% 93%    Recent laboratory studies:  Lab Results  Component Value Date   HGB 10.7 (L) 01/14/2022   HGB 13.4 12/22/2021   HGB 14.3 12/19/2021   Lab Results  Component Value Date   WBC 11.0 (H) 01/14/2022   PLT 185 01/14/2022   Lab Results  Component Value Date   INR 1.0 01/02/2022   Lab Results  Component Value Date   NA 133 (L) 01/14/2022   K 3.5 01/14/2022   CL 100 01/14/2022   CO2 26 01/14/2022   BUN 19 01/14/2022   CREATININE 0.85 01/14/2022   GLUCOSE 145 (H) 01/14/2022    Discharge Medications:   Allergies as of 01/14/2022   No Known Allergies      Medication List     STOP taking these medications    ibuprofen 200 MG tablet Commonly known as: ADVIL       TAKE these medications    acetaminophen 500 MG tablet Commonly known as: TYLENOL Take 2 tablets (1,000 mg total) by mouth every 8 (eight) hours as needed.   aspirin EC 81 MG tablet Take 1 tablet (81 mg total) by mouth 2 (two) times daily for 28 days. Swallow whole.  celecoxib 100 MG capsule Commonly known as: CeleBREX Take 1 capsule (100 mg total) by mouth 2 (two) times daily for 14 days.   cholecalciferol 25 MCG (1000 UNIT) tablet Commonly known as: VITAMIN D Take 2,000 Units by mouth daily.   exemestane 25 MG tablet Commonly known as: AROMASIN TAKE 1 TABLET (25 MG TOTAL) BY MOUTH DAILY AFTER BREAKFAST.   ondansetron 4 MG tablet Commonly known as: Zofran Take 1 tablet (4 mg total) by mouth every 8 (eight) hours as needed for up to 14 days for nausea or vomiting.   oxyCODONE 5 MG immediate release tablet Commonly known as: Roxicodone Take 1 tablet (5 mg total) by mouth every 4 (four) hours as needed for up to 7 days for severe pain or moderate pain.   simvastatin 20 MG tablet Commonly known as: ZOCOR Take 1 tablet (20 mg total) by mouth daily at 6 PM.   triamterene-hydrochlorothiazide 37.5-25 MG tablet Commonly known as:  MAXZIDE-25 Take 1 tablet by mouth daily.        Diagnostic Studies: DG Knee Right Port  Result Date: 01/13/2022 CLINICAL DATA:  Status post right knee arthroplasty EXAM: PORTABLE RIGHT KNEE - 1-2 VIEW COMPARISON:  None. FINDINGS: There is evidence of recent right knee arthroplasty. There are pockets of air in the soft tissues. No fractures are seen. IMPRESSION: Status post right knee arthroplasty. Electronically Signed   By: Elmer Picker M.D.   On: 01/13/2022 11:31    Disposition: Discharge disposition: 01-Home or Self Care          Follow-up Information     Willaim Sheng, MD. Go on 01/31/2022.   Specialty: Orthopedic Surgery Why: Your appointment is scheduled fro 11:00 Contact information: [address] [place] [address] [phone]         [facility], [place]. Go on 01/16/2022.   Why: Your outpaitent physical therapy appointment is at 10:00. Please arrive at 9:45 to complete your paperwork Contact information: [facility] [place] [address] [phone]                  Signed: [place] 01/17/2022, 6:29 AM

## 2022-01-19 DIAGNOSIS — M1711 Unilateral primary osteoarthritis, right knee: Secondary | ICD-10-CM | POA: Diagnosis not present

## 2022-01-19 DIAGNOSIS — Z96651 Presence of right artificial knee joint: Secondary | ICD-10-CM | POA: Diagnosis not present

## 2022-01-19 DIAGNOSIS — M25661 Stiffness of right knee, not elsewhere classified: Secondary | ICD-10-CM | POA: Diagnosis not present

## 2022-01-19 DIAGNOSIS — M6281 Muscle weakness (generalized): Secondary | ICD-10-CM | POA: Diagnosis not present

## 2022-01-19 DIAGNOSIS — M25561 Pain in right knee: Secondary | ICD-10-CM | POA: Diagnosis not present

## 2022-01-24 DIAGNOSIS — M25661 Stiffness of right knee, not elsewhere classified: Secondary | ICD-10-CM | POA: Diagnosis not present

## 2022-01-24 DIAGNOSIS — M1711 Unilateral primary osteoarthritis, right knee: Secondary | ICD-10-CM | POA: Diagnosis not present

## 2022-01-24 DIAGNOSIS — M6281 Muscle weakness (generalized): Secondary | ICD-10-CM | POA: Diagnosis not present

## 2022-01-24 DIAGNOSIS — M25561 Pain in right knee: Secondary | ICD-10-CM | POA: Diagnosis not present

## 2022-01-24 DIAGNOSIS — Z96651 Presence of right artificial knee joint: Secondary | ICD-10-CM | POA: Diagnosis not present

## 2022-01-27 DIAGNOSIS — Z96651 Presence of right artificial knee joint: Secondary | ICD-10-CM | POA: Diagnosis not present

## 2022-01-27 DIAGNOSIS — M1711 Unilateral primary osteoarthritis, right knee: Secondary | ICD-10-CM | POA: Diagnosis not present

## 2022-01-27 DIAGNOSIS — M25661 Stiffness of right knee, not elsewhere classified: Secondary | ICD-10-CM | POA: Diagnosis not present

## 2022-01-27 DIAGNOSIS — M6281 Muscle weakness (generalized): Secondary | ICD-10-CM | POA: Diagnosis not present

## 2022-01-27 DIAGNOSIS — M25561 Pain in right knee: Secondary | ICD-10-CM | POA: Diagnosis not present

## 2022-01-31 DIAGNOSIS — M25661 Stiffness of right knee, not elsewhere classified: Secondary | ICD-10-CM | POA: Diagnosis not present

## 2022-01-31 DIAGNOSIS — M25561 Pain in right knee: Secondary | ICD-10-CM | POA: Diagnosis not present

## 2022-01-31 DIAGNOSIS — Z96651 Presence of right artificial knee joint: Secondary | ICD-10-CM | POA: Diagnosis not present

## 2022-01-31 DIAGNOSIS — M1711 Unilateral primary osteoarthritis, right knee: Secondary | ICD-10-CM | POA: Diagnosis not present

## 2022-01-31 DIAGNOSIS — M6281 Muscle weakness (generalized): Secondary | ICD-10-CM | POA: Diagnosis not present

## 2022-02-02 DIAGNOSIS — M1711 Unilateral primary osteoarthritis, right knee: Secondary | ICD-10-CM | POA: Diagnosis not present

## 2022-02-03 DIAGNOSIS — M1711 Unilateral primary osteoarthritis, right knee: Secondary | ICD-10-CM | POA: Diagnosis not present

## 2022-02-03 DIAGNOSIS — M25561 Pain in right knee: Secondary | ICD-10-CM | POA: Diagnosis not present

## 2022-02-03 DIAGNOSIS — M6281 Muscle weakness (generalized): Secondary | ICD-10-CM | POA: Diagnosis not present

## 2022-02-03 DIAGNOSIS — Z96651 Presence of right artificial knee joint: Secondary | ICD-10-CM | POA: Diagnosis not present

## 2022-02-03 DIAGNOSIS — M25661 Stiffness of right knee, not elsewhere classified: Secondary | ICD-10-CM | POA: Diagnosis not present

## 2022-02-06 DIAGNOSIS — M6281 Muscle weakness (generalized): Secondary | ICD-10-CM | POA: Diagnosis not present

## 2022-02-06 DIAGNOSIS — M1711 Unilateral primary osteoarthritis, right knee: Secondary | ICD-10-CM | POA: Diagnosis not present

## 2022-02-06 DIAGNOSIS — M25661 Stiffness of right knee, not elsewhere classified: Secondary | ICD-10-CM | POA: Diagnosis not present

## 2022-02-06 DIAGNOSIS — M25561 Pain in right knee: Secondary | ICD-10-CM | POA: Diagnosis not present

## 2022-02-06 DIAGNOSIS — Z96651 Presence of right artificial knee joint: Secondary | ICD-10-CM | POA: Diagnosis not present

## 2022-02-10 DIAGNOSIS — Z96651 Presence of right artificial knee joint: Secondary | ICD-10-CM | POA: Diagnosis not present

## 2022-02-10 DIAGNOSIS — M6281 Muscle weakness (generalized): Secondary | ICD-10-CM | POA: Diagnosis not present

## 2022-02-10 DIAGNOSIS — M1711 Unilateral primary osteoarthritis, right knee: Secondary | ICD-10-CM | POA: Diagnosis not present

## 2022-02-10 DIAGNOSIS — M25561 Pain in right knee: Secondary | ICD-10-CM | POA: Diagnosis not present

## 2022-02-10 DIAGNOSIS — M25661 Stiffness of right knee, not elsewhere classified: Secondary | ICD-10-CM | POA: Diagnosis not present

## 2022-02-14 DIAGNOSIS — Z96651 Presence of right artificial knee joint: Secondary | ICD-10-CM | POA: Diagnosis not present

## 2022-02-14 DIAGNOSIS — M25561 Pain in right knee: Secondary | ICD-10-CM | POA: Diagnosis not present

## 2022-02-14 DIAGNOSIS — M1711 Unilateral primary osteoarthritis, right knee: Secondary | ICD-10-CM | POA: Diagnosis not present

## 2022-02-14 DIAGNOSIS — M6281 Muscle weakness (generalized): Secondary | ICD-10-CM | POA: Diagnosis not present

## 2022-02-14 DIAGNOSIS — M25661 Stiffness of right knee, not elsewhere classified: Secondary | ICD-10-CM | POA: Diagnosis not present

## 2022-02-17 DIAGNOSIS — M6281 Muscle weakness (generalized): Secondary | ICD-10-CM | POA: Diagnosis not present

## 2022-02-17 DIAGNOSIS — Z96651 Presence of right artificial knee joint: Secondary | ICD-10-CM | POA: Diagnosis not present

## 2022-02-17 DIAGNOSIS — M25561 Pain in right knee: Secondary | ICD-10-CM | POA: Diagnosis not present

## 2022-02-17 DIAGNOSIS — M1711 Unilateral primary osteoarthritis, right knee: Secondary | ICD-10-CM | POA: Diagnosis not present

## 2022-02-17 DIAGNOSIS — M25661 Stiffness of right knee, not elsewhere classified: Secondary | ICD-10-CM | POA: Diagnosis not present

## 2022-02-21 DIAGNOSIS — M1711 Unilateral primary osteoarthritis, right knee: Secondary | ICD-10-CM | POA: Diagnosis not present

## 2022-02-21 DIAGNOSIS — Z96651 Presence of right artificial knee joint: Secondary | ICD-10-CM | POA: Diagnosis not present

## 2022-02-21 DIAGNOSIS — M6281 Muscle weakness (generalized): Secondary | ICD-10-CM | POA: Diagnosis not present

## 2022-02-21 DIAGNOSIS — M25661 Stiffness of right knee, not elsewhere classified: Secondary | ICD-10-CM | POA: Diagnosis not present

## 2022-02-21 DIAGNOSIS — M25561 Pain in right knee: Secondary | ICD-10-CM | POA: Diagnosis not present

## 2022-02-24 DIAGNOSIS — M25561 Pain in right knee: Secondary | ICD-10-CM | POA: Diagnosis not present

## 2022-02-24 DIAGNOSIS — Z96651 Presence of right artificial knee joint: Secondary | ICD-10-CM | POA: Diagnosis not present

## 2022-02-24 DIAGNOSIS — M25661 Stiffness of right knee, not elsewhere classified: Secondary | ICD-10-CM | POA: Diagnosis not present

## 2022-02-24 DIAGNOSIS — M6281 Muscle weakness (generalized): Secondary | ICD-10-CM | POA: Diagnosis not present

## 2022-02-24 DIAGNOSIS — M1711 Unilateral primary osteoarthritis, right knee: Secondary | ICD-10-CM | POA: Diagnosis not present

## 2022-02-28 DIAGNOSIS — M25561 Pain in right knee: Secondary | ICD-10-CM | POA: Diagnosis not present

## 2022-02-28 DIAGNOSIS — M25661 Stiffness of right knee, not elsewhere classified: Secondary | ICD-10-CM | POA: Diagnosis not present

## 2022-02-28 DIAGNOSIS — M1711 Unilateral primary osteoarthritis, right knee: Secondary | ICD-10-CM | POA: Diagnosis not present

## 2022-02-28 DIAGNOSIS — Z96651 Presence of right artificial knee joint: Secondary | ICD-10-CM | POA: Diagnosis not present

## 2022-02-28 DIAGNOSIS — M6281 Muscle weakness (generalized): Secondary | ICD-10-CM | POA: Diagnosis not present

## 2022-03-01 ENCOUNTER — Other Ambulatory Visit: Payer: Self-pay

## 2022-03-01 ENCOUNTER — Other Ambulatory Visit: Payer: Self-pay | Admitting: Family Medicine

## 2022-03-02 DIAGNOSIS — M1711 Unilateral primary osteoarthritis, right knee: Secondary | ICD-10-CM | POA: Diagnosis not present

## 2022-03-03 DIAGNOSIS — M25661 Stiffness of right knee, not elsewhere classified: Secondary | ICD-10-CM | POA: Diagnosis not present

## 2022-03-03 DIAGNOSIS — M6281 Muscle weakness (generalized): Secondary | ICD-10-CM | POA: Diagnosis not present

## 2022-03-03 DIAGNOSIS — M1711 Unilateral primary osteoarthritis, right knee: Secondary | ICD-10-CM | POA: Diagnosis not present

## 2022-03-03 DIAGNOSIS — Z96651 Presence of right artificial knee joint: Secondary | ICD-10-CM | POA: Diagnosis not present

## 2022-03-03 DIAGNOSIS — M25561 Pain in right knee: Secondary | ICD-10-CM | POA: Diagnosis not present

## 2022-03-06 ENCOUNTER — Other Ambulatory Visit: Payer: Self-pay | Admitting: Family Medicine

## 2022-04-13 DIAGNOSIS — M1711 Unilateral primary osteoarthritis, right knee: Secondary | ICD-10-CM | POA: Diagnosis not present

## 2022-05-01 ENCOUNTER — Ambulatory Visit
Admission: RE | Admit: 2022-05-01 | Discharge: 2022-05-01 | Disposition: A | Discharge status: Home / Self Care | Payer: Medicare PPO | Source: Ambulatory Visit | Attending: Hematology | Admitting: Hematology

## 2022-05-01 DIAGNOSIS — C50911 Malignant neoplasm of unspecified site of right female breast: Secondary | ICD-10-CM

## 2022-05-01 DIAGNOSIS — Z853 Personal history of malignant neoplasm of breast: Secondary | ICD-10-CM | POA: Diagnosis not present

## 2022-05-01 DIAGNOSIS — R928 Other abnormal and inconclusive findings on diagnostic imaging of breast: Secondary | ICD-10-CM | POA: Diagnosis not present

## 2022-06-18 ENCOUNTER — Other Ambulatory Visit: Payer: Self-pay | Admitting: Nurse Practitioner

## 2022-06-18 DIAGNOSIS — C50911 Malignant neoplasm of unspecified site of right female breast: Secondary | ICD-10-CM

## 2022-06-18 NOTE — Progress Notes (Unsigned)
[facility]   [phone] [phone]   Clinic Follow up Note   Patient Care Team: [name], MD as PCP - General (Family Medicine) [name], RN as Oncology Nurse Navigator [name], RN as Oncology Nurse Navigator [name], MD as Consulting Physician (General Surgery) [name], MD as Consulting Physician (Hematology) [name], MD as Consulting Physician (Radiation Oncology) [name], NP as Nurse Practitioner (Nurse Practitioner) 06/18/2022  CHIEF COMPLAINT: Follow up right breast cancer   SUMMARY OF ONCOLOGIC HISTORY: Oncology History Overview Note  Cancer Staging malignant neoplasm right central breast Staging form: Breast, AJCC 8th Edition - Clinical stage from 05/04/2020: Stage IA (cT1, cN0, cM0, G2, ER+, PR+, HER2-) - Signed by [name], NP on 05/24/2020 - Pathologic stage from 06/08/2020: Stage IA (pT1c, pN0, cM0, G2, ER+, PR+, HER2-, Oncotype DX score: 24) - Signed by [name], MD on 08/22/2020    malignant neoplasm right central breast  04/27/2020 Breast US   FINDINGS: Mammogram: Spot compression tomosynthesis views of the right breast were performed. There is persistence of architectural distortion in the lower central to slightly outer right breast. The area of distortion spans approximately 0.9 cm. Ultrasound: Targeted ultrasound is performed in the right breast at 6 o'clock 1 cm from the nipple demonstrating an irregular hypoechoic mass measuring 0.8 x 0.6 x 0.6 cm. The surrounding tissues are distorted. No internal blood flow identified. Targeted ultrasound of the right axilla demonstrates normal-appearing lymph nodes.   IMPRESSION: Right breast mass at 6 o'clock measuring 0.8 cm is suspicious and likely corresponds to the distortion seen mammographically.   05/04/2020 Cancer Staging   Staging form: Breast, AJCC 8th Edition - Clinical stage from 05/04/2020: Stage IA (cT1, cN0, cM0,  G2, ER+, PR+, HER2-) - Signed by [name], NP on 05/24/2020   05/04/2020 Initial Biopsy   FINAL MICROSCOPIC DIAGNOSIS:  A. BREAST, RIGHT/6:00, BIOPSY:  - Invasive ductal carcinoma.  - Ductal carcinoma in situ.  - Complex sclerosing lesion. COMMENT:  The carcinoma appears grade 2.  The greatest linear extent of tumor in any one core is 7 mm.   Immunohistochemical and morphometric analysis performed manually  The tumor cells are NEGATIVE for Her2 (1+).  Estrogen Receptor:       POSITIVE, 90% STRONG STAINING  Progesterone Receptor:   POSITIVE, 90% STRONG STAINING  Proliferation Marker Ki-67:   10%    05/06/2020 Initial Diagnosis   malignant neoplasm right central breast   06/02/2020 Genetic Testing   Negative genetic testing:  No pathogenic variants detected on the [facility] Breast Cancer STAT panel + Common Hereditary Cancers Panel. A variant of uncertain significance (VUS) was detected in the AXIN2 gene called c.1235A>C. The report date is 06/02/2020.  The Breast Cancer STAT Panel offered by [facility] includes sequencing and deletion/duplication analysis for the following 9 genes:  ATM, BRCA1, BRCA2, CDH1, CHEK2, PALB2, PTEN, STK11 and TP53. The Common Hereditary Cancers Panel offered by [facility] includes sequencing and/or deletion duplication testing of the following 48 genes: APC, ATM, AXIN2, BARD1, BMPR1A, BRCA1, BRCA2, BRIP1, CDH1, CDK4, CDKN2A (p14ARF), CDKN2A (p16INK4a), CHEK2, CTNNA1, DICER1, EPCAM (Deletion/duplication testing only), GREM1 (promoter region deletion/duplication testing only), KIT, MEN1, MLH1, MSH2, MSH3, MSH6, MUTYH, NBN, NF1, NHTL1, PALB2, PDGFRA, PMS2, POLD1, POLE, PTEN, RAD50, RAD51C, RAD51D, RNF43, SDHB, SDHC, SDHD, SMAD4, SMARCA4. STK11, TP53, TSC1, TSC2, and VHL.  The following genes were evaluated for sequence changes only: SDHA and HOXB13 c.251G>A variant only.  06/08/2020 Cancer Staging   Staging form: Breast, AJCC 8th Edition - Pathologic stage from  06/08/2020: Stage IA (pT1c, pN0, cM0, G2, ER+, PR+, HER2-, Oncotype DX score: 24) - Signed by [name], MD on 08/22/2020   06/08/2020 Surgery   RIGHT BREAST LUMPECTOMY WITH RADIOACTIVE SEED AND SENTINEL LYMPH NODE MAPPING with Dr Brantley Stage    06/08/2020 Pathology Results   FINAL MICROSCOPIC DIAGNOSIS:   A. BREAST, RIGHT, LUMPECTOMY:  - Invasive ductal carcinoma, grade 2, spanning 1.1 cm.  - High grade ductal carcinoma in situ with necrosis.  - Biopsy site.  - Resection margins negative for invasive carcinoma.  - In situ carcinoma is <0.1 cm to the posterior margin broadly, see  comment.  - See oncology table.   B. LYMPH NODE, RIGHT AXILLARY, SENTINEL, EXCISION:  - One of one lymph nodes negative for carcinoma (0/1).    06/08/2020 Oncotype testing   Recurrence score 24  Distant recurrence risk at 9 years with AI or Tamoxifen alone is 10% There is less than 1% benefit of adjuvant chemotherapy.    07/26/2020 - 08/25/2020 Radiation Therapy   Adjuvant Radiation with Dr [name] 07/26/20-08/25/20   09/2020 -  Anti-estrogen oral therapy   Exemestane 2m once daily starting in 09/2020   11/22/2020 Survivorship   SCP delivered by [name] NP      CURRENT THERAPY: Exemestane, starting 09/2020  INTERVAL HISTORY: Ms. BMcgintyreturns for follow up as scheduled. Last seen by Dr. FBurr Medico1/12/23. She underwent right knee replacement 01/14/22 and was anemic during the admission. She has*** recovered. Mammogram 05/01/22 was negative. She is scheduled for DEXA 08/2022. She continues exemestane.    REVIEW OF SYSTEMS:   Constitutional: Denies fevers, chills or abnormal weight loss Eyes: Denies blurriness of vision Ears, nose, mouth, throat, and face: Denies mucositis or sore throat Respiratory: Denies cough, dyspnea or wheezes Cardiovascular: Denies palpitation, chest discomfort or lower extremity swelling Gastrointestinal:  Denies nausea, heartburn or change in bowel habits Skin: Denies abnormal skin  rashes Lymphatics: Denies new lymphadenopathy or easy bruising Neurological:Denies numbness, tingling or new weaknesses Behavioral/Psych: Mood is stable, no new changes  All other systems were reviewed with the patient and are negative.  MEDICAL HISTORY:  Past Medical History:  Diagnosis Date   Arthritis    Family history of bone cancer    Family history of colon cancer    Family history of leukemia    Family history of lung cancer    Family history of melanoma    Family history of ovarian cancer    Family history of stomach cancer    Family history of uterine cancer    History of kidney stones    Hyperlipemia    Hypertension    [place]    right    radiation only   Lower extremity edema    Obesity    PAC (premature atrial contraction)     SURGICAL HISTORY: Past Surgical History:  Procedure Laterality Date   BREAST BIOPSY Left    BREAST LUMPECTOMY WITH RADIOACTIVE SEED AND SENTINEL LYMPH NODE BIOPSY Right 06/08/2020   Procedure: RIGHT BREAST LUMPECTOMY WITH RADIOACTIVE SEED AND SENTINEL LYMPH NODE MAPPING;  Surgeon: CErroll Luna MD;  [facility]  Service: General;  Laterality: Right;  PEC BLOCK   CHOLECYSTECTOMY     COLONOSCOPY  01/01/2012   Procedure: COLONOSCOPY;  Surgeon: RDaneil Dolin MD;  [facility];  Service: Endoscopy;  Laterality: N/A;  10:00 AM  COLONOSCOPY WITH PROPOFOL N/A 10/07/2020   Procedure: COLONOSCOPY WITH PROPOFOL;  Surgeon: Daneil Dolin, MD;  [facility];  Service: Endoscopy;  Laterality: N/A;  9:45am   JOINT REPLACEMENT  2006   left knee   POLYPECTOMY  10/07/2020   Procedure: POLYPECTOMY;  Surgeon: Daneil Dolin, MD;  [facility];  Service: Endoscopy;;   TOTAL KNEE ARTHROPLASTY Right 01/13/2022   Procedure: TOTAL KNEE ARTHROPLASTY;  Surgeon: Willaim Sheng, MD;  Location: [facility];  Service: Orthopedics;  Laterality: Right;   US ECHOCARDIOGRAPHY  08/21/2011   LA  mild to mod. dilated,mild MR,TR    I have reviewed the social history and family history with the patient and they are unchanged from previous note.  ALLERGIES:  has No Known Allergies.  MEDICATIONS:  Current Outpatient Medications  Medication Sig Dispense Refill   ASPIRIN CHILDRENS 81 MG chewable tablet Chew 81 mg by mouth 2 (two) times daily.     cholecalciferol (VITAMIN D) 25 MCG (1000 UNIT) tablet Take 2,000 Units by mouth daily.     exemestane (AROMASIN) 25 MG tablet TAKE 1 TABLET (25 MG TOTAL) BY MOUTH DAILY AFTER BREAKFAST. 90 tablet 3   simvastatin (ZOCOR) 20 MG tablet TAKE 1 TABLET BY MOUTH DAILY AT 6 PM. 90 tablet 3   triamterene-hydrochlorothiazide (MAXZIDE-25) 37.5-25 MG tablet TAKE 1 TABLET BY MOUTH EVERY DAY 90 tablet 3   No current facility-administered medications for this visit.    PHYSICAL EXAMINATION: ECOG PERFORMANCE STATUS: {CHL ONC ECOG PS:[id number]}  There were no vitals filed for this visit. There were no vitals filed for this visit.  GENERAL:alert, no distress and comfortable SKIN: skin color, texture, turgor are normal, no rashes or significant lesions EYES: normal, Conjunctiva are pink and non-injected, sclera clear OROPHARYNX:no exudate, no erythema and lips, buccal mucosa, and tongue normal  NECK: supple, thyroid normal size, non-tender, without nodularity LYMPH:  no palpable lymphadenopathy in the cervical, axillary or inguinal LUNGS: clear to auscultation and percussion with normal breathing effort HEART: regular rate & rhythm and no murmurs and no lower extremity edema ABDOMEN:abdomen soft, non-tender and normal bowel sounds Musculoskeletal:no cyanosis of digits and no clubbing  NEURO: alert & oriented x 3 with fluent speech, no focal motor/sensory deficits  LABORATORY DATA:  I have reviewed the data as listed    Latest Ref Rng & Units 01/14/2022    3:08 AM 12/22/2021   11:12 AM 12/19/2021   11:59 AM  CBC  WBC 4.0 - 10.5 K/uL 11.0  8.9  9.9    Hemoglobin 12.0 - 15.0 g/dL 10.7  13.4  14.3   Hematocrit 36.0 - 46.0 % 33.0  41.0  42.8   Platelets 150 - 400 K/uL 185  208  252         Latest Ref Rng & Units 01/14/2022    3:08 AM 12/22/2021   11:12 AM 12/19/2021   11:59 AM  CMP  Glucose 70 - 99 mg/dL 145  96  91   BUN 8 - 23 mg/dL _0 Creatinine 0.44 - 1.00 mg/dL 0.85  0.80  0.73   Sodium 135 - 145 mmol/L 133  140  138   Potassium 3.5 - 5.1 mmol/L 3.5  3.7  4.6   Chloride 98 - 111 mmol/L 100  104  100   CO2 22 - 32 mmol/L _1 Calcium 8.9 - 10.3 mg/dL 8.6  10.3  10.5  Total Protein 6.5 - 8.1 g/dL  7.4  7.2   Total Bilirubin 0.3 - 1.2 mg/dL  1.4  1.7   Alkaline Phos 38 - 126 U/L  79    AST 15 - 41 U/L  17  18   ALT 0 - 44 U/L  16  15       RADIOGRAPHIC STUDIES: I have personally reviewed the radiological images as listed and agreed with the findings in the report. No results found.   ASSESSMENT & PLAN:  No problem-specific Assessment & Plan notes found for this encounter.   No orders of the defined types were placed in this encounter.  All questions were answered. The patient knows to call the clinic with any problems, questions or concerns. No barriers to learning was detected. I spent {CHL ONC TIME VISIT - FWBLT:[id number]} counseling the patient face to face. The total time spent in the appointment was {CHL ONC TIME VISIT - AREQJ:[id number]} and more than 50% was on counseling and review of test results     [name], NP 06/18/22

## 2022-06-21 ENCOUNTER — Other Ambulatory Visit: Payer: Self-pay

## 2022-06-21 ENCOUNTER — Inpatient Hospital Stay: Payer: Medicare PPO

## 2022-06-21 ENCOUNTER — Encounter: Payer: Self-pay | Admitting: Nurse Practitioner

## 2022-06-21 ENCOUNTER — Inpatient Hospital Stay: Payer: Medicare PPO | Attending: Nurse Practitioner | Admitting: Nurse Practitioner

## 2022-06-21 VITALS — BP 140/78 | HR 84 | Temp 98.4°F | Resp 17 | Ht 62.0 in | Wt 188.2 lb

## 2022-06-21 DIAGNOSIS — Z170 Estrogen receptor positive status [ER+]: Secondary | ICD-10-CM | POA: Diagnosis not present

## 2022-06-21 DIAGNOSIS — C50111 Malignant neoplasm of central portion of right female breast: Secondary | ICD-10-CM | POA: Insufficient documentation

## 2022-06-21 DIAGNOSIS — C50911 Malignant neoplasm of unspecified site of right female breast: Secondary | ICD-10-CM | POA: Diagnosis not present

## 2022-06-21 LAB — CMP (CANCER CENTER ONLY)
ALT: 15 U/L (ref 0–44)
AST: 18 U/L (ref 15–41)
Albumin: 4.7 g/dL (ref 3.5–5.0)
Alkaline Phosphatase: 77 U/L (ref 38–126)
Anion gap: 7 (ref 5–15)
BUN: 20 mg/dL (ref 8–23)
CO2: 29 mmol/L (ref 22–32)
Calcium: 10.3 mg/dL (ref 8.9–10.3)
Chloride: 103 mmol/L (ref 98–111)
Creatinine: 0.86 mg/dL (ref 0.44–1.00)
GFR, Estimated: >60 mL/min (ref >60)
Glucose, Bld: 115 mg/dL — ABNORMAL HIGH (ref 70–99)
Potassium: 4.3 mmol/L (ref 3.5–5.1)
Sodium: 139 mmol/L (ref 135–145)
Total Bilirubin: 1.8 mg/dL — ABNORMAL HIGH (ref 0.3–1.2)
Total Protein: 7.7 g/dL (ref 6.5–8.1)

## 2022-06-21 LAB — CBC WITH DIFFERENTIAL (CANCER CENTER ONLY)
Abs Immature Granulocytes: 0.04 10*3/uL (ref 0.00–0.07)
Basophils Absolute: 0 10*3/uL (ref 0.0–0.1)
Basophils Relative: 1 %
Eosinophils Absolute: 0.2 10*3/uL (ref 0.0–0.5)
Eosinophils Relative: 2 %
HCT: 41.5 % (ref 36.0–46.0)
Hemoglobin: 13.6 g/dL (ref 12.0–15.0)
Immature Granulocytes: 1 %
Lymphocytes Relative: 31 %
Lymphs Abs: 2.6 10*3/uL (ref 0.7–4.0)
MCH: 26.9 pg (ref 26.0–34.0)
MCHC: 32.8 g/dL (ref 30.0–36.0)
MCV: 82.2 fL (ref 80.0–100.0)
Monocytes Absolute: 0.4 10*3/uL (ref 0.1–1.0)
Monocytes Relative: 5 %
Neutro Abs: 5.1 10*3/uL (ref 1.7–7.7)
Neutrophils Relative %: 60 %
Platelet Count: 196 10*3/uL (ref 150–400)
RBC: 5.05 MIL/uL (ref 3.87–5.11)
RDW: 14.2 % (ref 11.5–15.5)
WBC Count: 8.3 10*3/uL (ref 4.0–10.5)
nRBC: 0 % (ref 0.0–0.2)

## 2022-06-29 ENCOUNTER — Ambulatory Visit (HOSPITAL_COMMUNITY)
Admission: RE | Admit: 2022-06-29 | Discharge: 2022-06-29 | Disposition: A | Discharge status: Home / Self Care | Payer: Medicare PPO | Source: Ambulatory Visit | Attending: Nurse Practitioner | Admitting: Nurse Practitioner

## 2022-06-29 DIAGNOSIS — N281 Cyst of kidney, acquired: Secondary | ICD-10-CM | POA: Diagnosis not present

## 2022-06-29 DIAGNOSIS — Z9049 Acquired absence of other specified parts of digestive tract: Secondary | ICD-10-CM | POA: Diagnosis not present

## 2022-08-17 IMAGING — MG DIGITAL DIAGNOSTIC BILAT W/ TOMO W/ CAD
6 of 9 series · 6 of 25 positions shown · non-contrast
Comparison: Previous exam(s).

CLINICAL DATA: 70-year-old female status post malignant right
lumpectomy in 4049.

EXAM:
DIGITAL DIAGNOSTIC BILATERAL MAMMOGRAM WITH TOMOSYNTHESIS AND CAD
TECHNIQUE: Bilateral digital diagnostic mammography and breast tomosynthesis
was performed. The images were evaluated with computer-aided
detection.

[R CC]
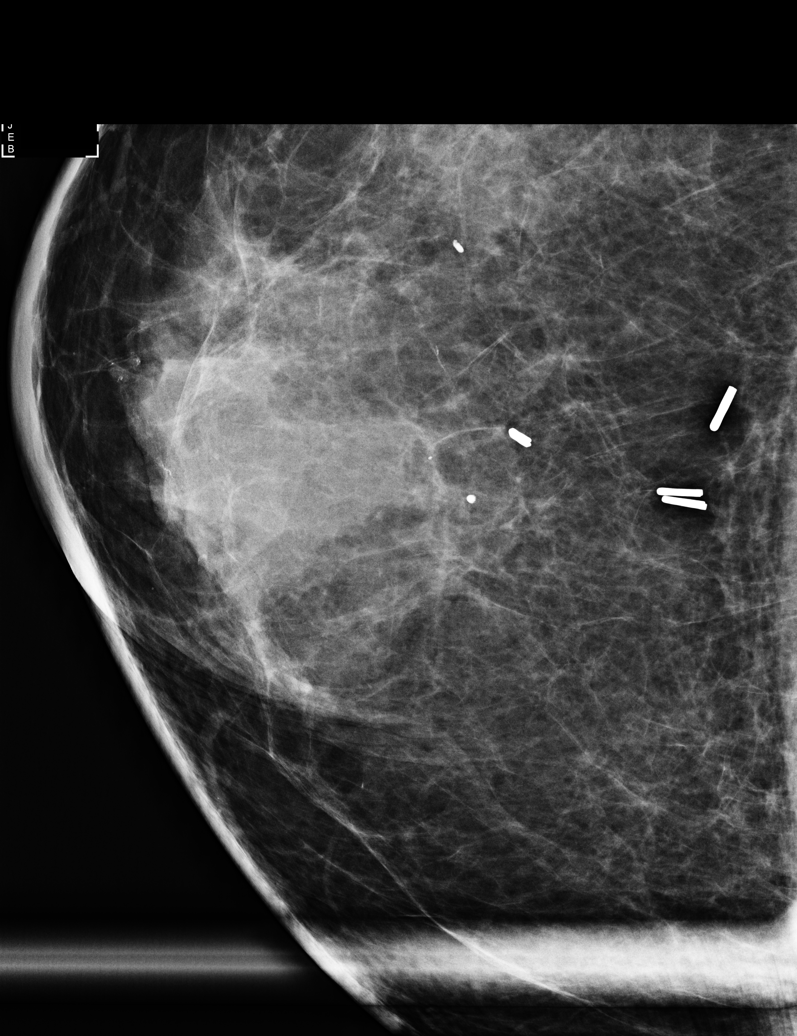

[R MLO synth-2D]
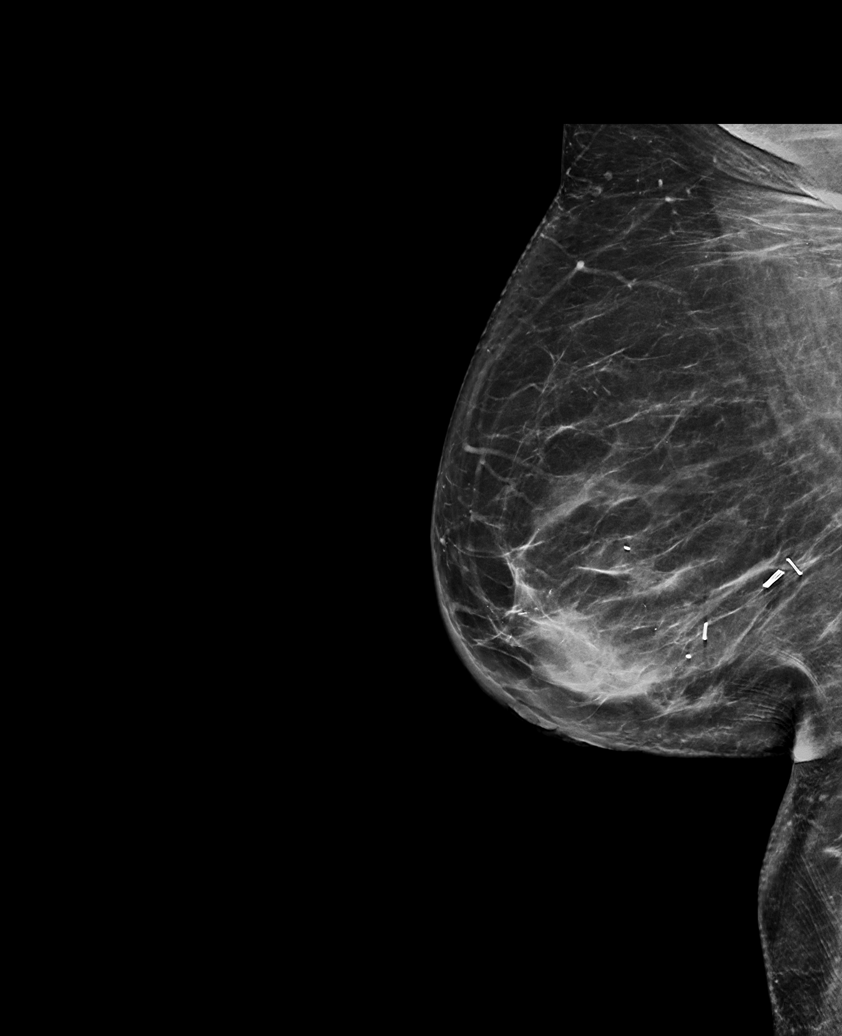

[L MLO synth-2D]
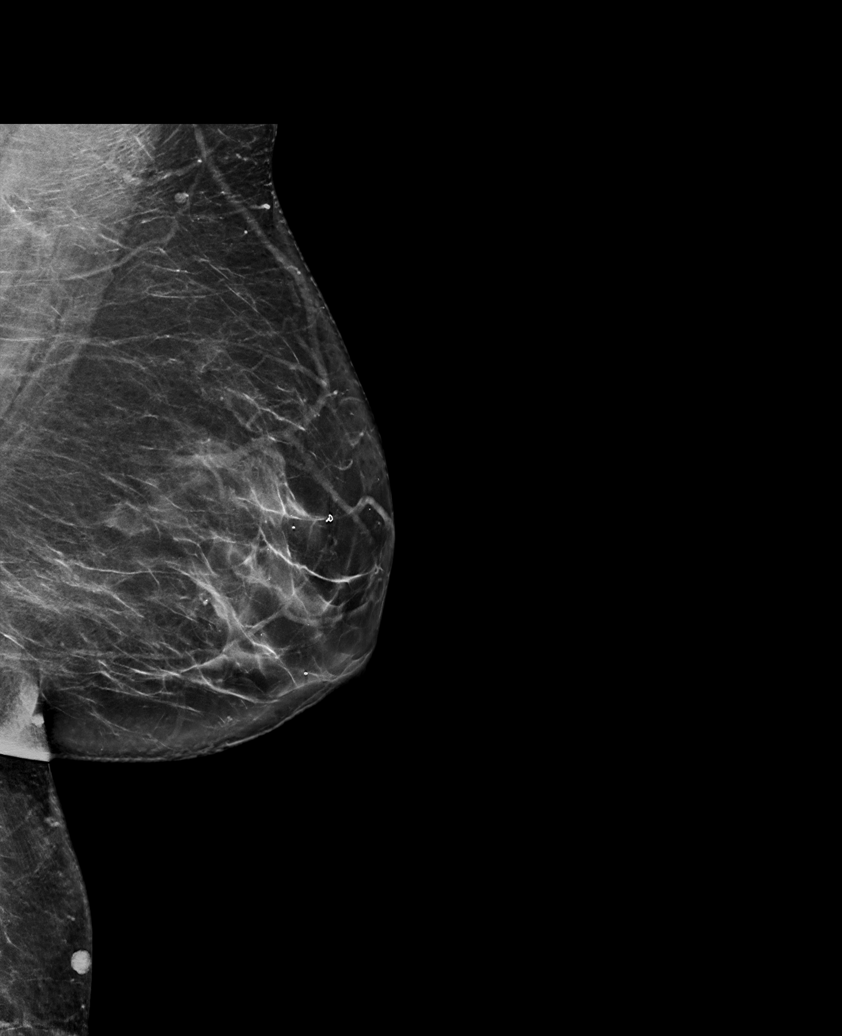

[L CC synth-2D]
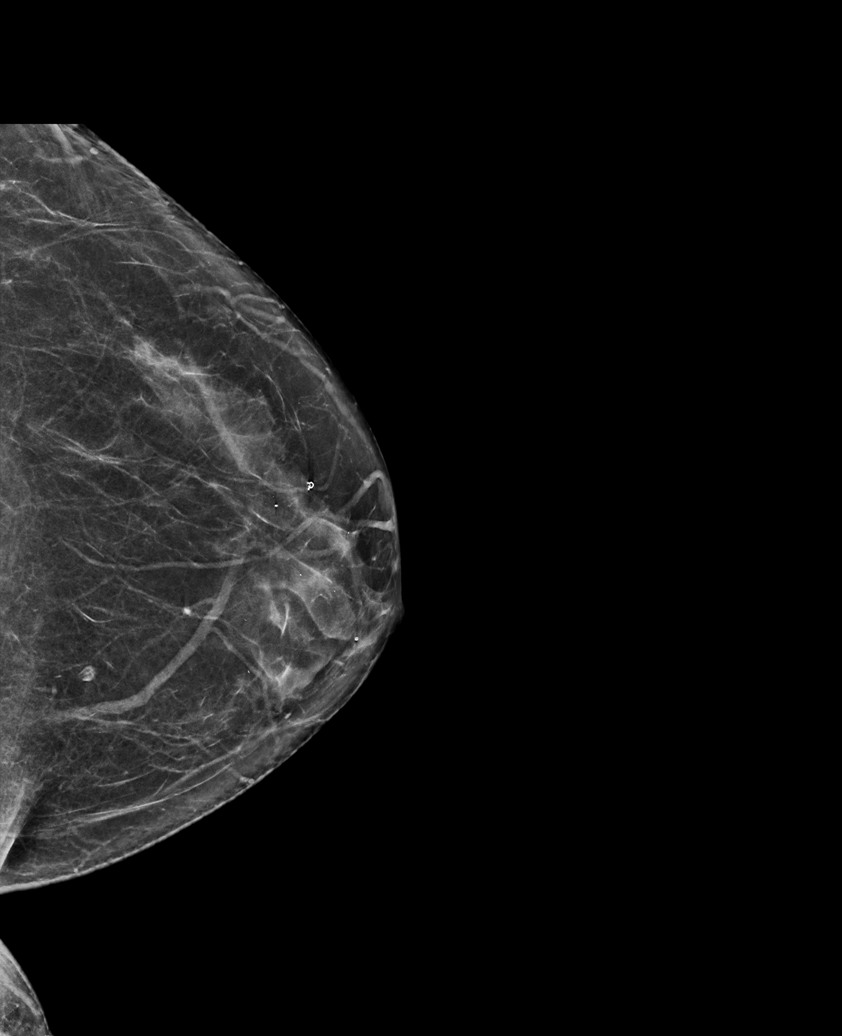

[R CC synth-2D]
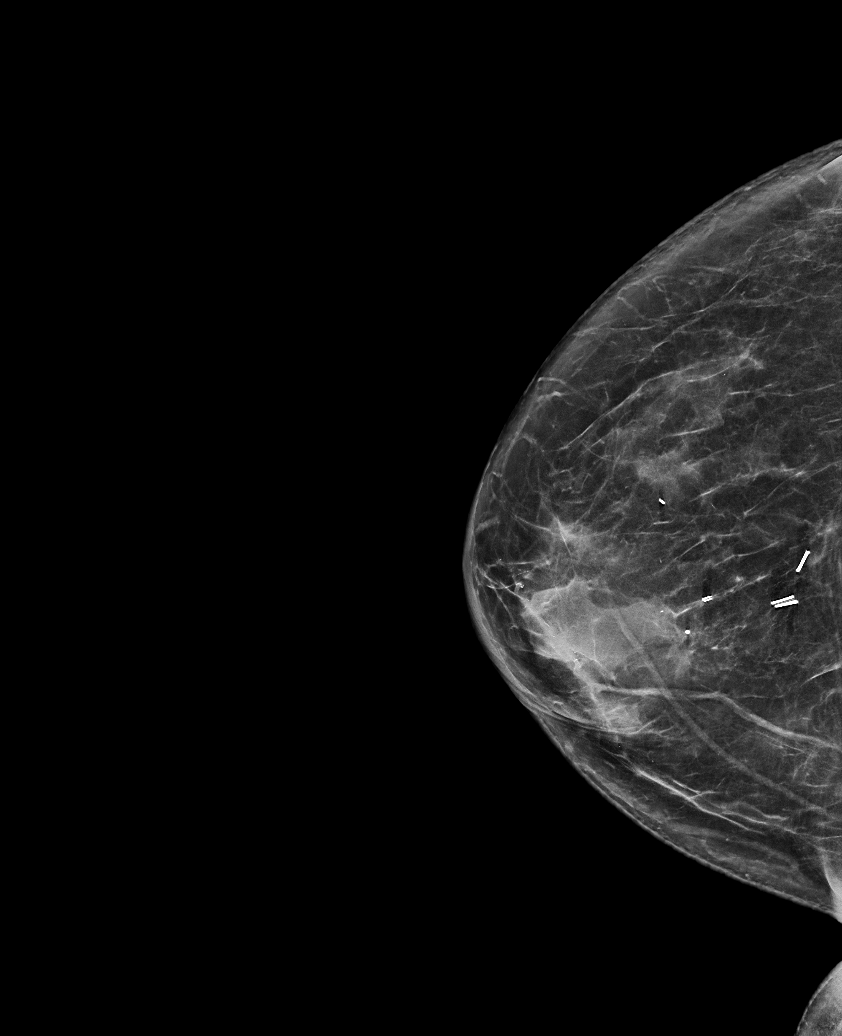

[R CC tomo · tomo slice 35/69.0]
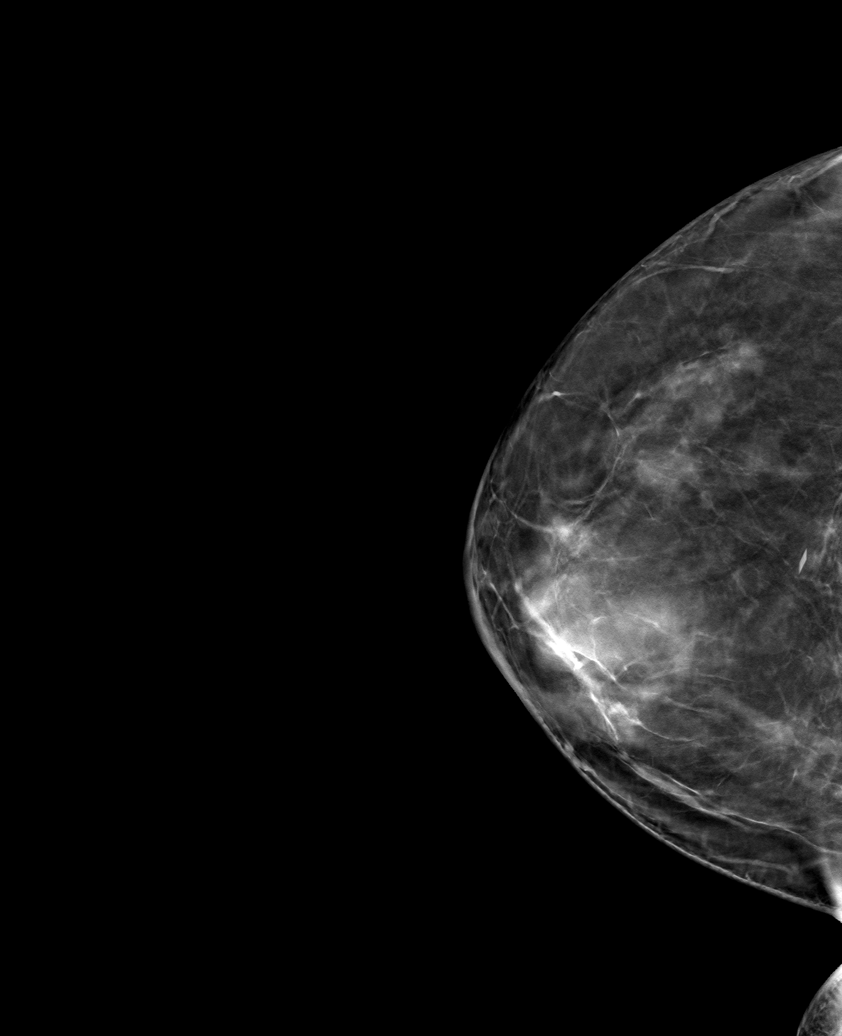

[6 of 25 positions shown; findings below may reference images not displayed]

ACR Breast Density Category b: There are scattered areas of
fibroglandular density.
FINDINGS: Stable postsurgical changes in the deep central right breast.
Otherwise, no new or suspicious findings in either breast. The
parenchymal pattern is stable.
IMPRESSION: 1. No mammographic evidence of malignancy in either breast.
2. Stable right breast posttreatment changes.

RECOMMENDATION:
Per protocol, as the patient is now 2 or more years status post
lumpectomy, she may return to annual screening mammography in 1
year. However, given the history of breast cancer, the patient
remains eligible for annual diagnostic mammography if preferred.
(Code:3U-V-HAT)

I have discussed the findings and recommendations with the patient.
If applicable, a reminder letter will be sent to the patient
regarding the next appointment.

BI-RADS CATEGORY  2: Benign.

## 2022-09-08 ENCOUNTER — Ambulatory Visit
Admission: RE | Admit: 2022-09-08 | Discharge: 2022-09-08 | Disposition: A | Discharge status: Home / Self Care | Payer: Medicare PPO | Source: Ambulatory Visit | Attending: Hematology | Admitting: Hematology

## 2022-09-08 DIAGNOSIS — Z780 Asymptomatic menopausal state: Secondary | ICD-10-CM | POA: Diagnosis not present

## 2022-09-08 DIAGNOSIS — E2839 Other primary ovarian failure: Secondary | ICD-10-CM

## 2022-10-30 ENCOUNTER — Telehealth: Payer: Self-pay | Admitting: Family Medicine

## 2022-10-30 NOTE — Telephone Encounter (Signed)
Left message for patient to call back and schedule Medicare Annual Wellness Visit (AWV) in office.   If not able to come in office, please offer to do virtually or by telephone.   Last AWV:06/10/2021   Please schedule at anytime with [facility]  If any questions, please contact me at [phone].  Thank you ,  [place]letta [place]

## 2022-11-11 ENCOUNTER — Other Ambulatory Visit: Payer: Self-pay | Admitting: Nurse Practitioner

## 2022-11-23 ENCOUNTER — Ambulatory Visit (INDEPENDENT_AMBULATORY_CARE_PROVIDER_SITE_OTHER): Payer: Medicare PPO

## 2022-11-23 VITALS — BP 126/64 | Ht 61.0 in | Wt 188.0 lb

## 2022-11-23 DIAGNOSIS — Z0000 Encounter for general adult medical examination without abnormal findings: Secondary | ICD-10-CM | POA: Diagnosis not present

## 2022-11-23 NOTE — Progress Notes (Signed)
Subjective:   [name] is a 71 y.o. female who presents for Medicare Annual (Subsequent) preventive examination.  Review of Systems     Cardiac Risk Factors include: advanced age (>48mn, >>94women);dyslipidemia;hypertension     Objective:    Today's Vitals   11/23/22 0943  BP: 126/64  Weight: 188 lb (85.3 kg)  Height: '5\' 1"'$  (1.549 m)   Body mass index is 35.52 kg/m.     11/23/2022   10:00 AM 01/13/2022    5:15 PM 01/02/2022   10:35 AM 06/10/2021   11:27 AM 10/07/2020    8:56 AM 06/24/2020   12:45 PM 06/04/2020   10:04 AM  Advanced Directives  Does Patient Have a Medical Advance Directive? No No No No No No No  Would patient like information on creating a medical advance directive? Yes (MAU/Ambulatory/Procedural Areas - Information given) No - Patient declined No - Patient declined No - Patient declined No - Patient declined No - Patient declined No - Patient declined    Current Medications (verified) Outpatient Encounter Medications as of 11/23/2022  Medication Sig   cholecalciferol (VITAMIN D) 25 MCG (1000 UNIT) tablet Take 2,000 Units by mouth daily.   exemestane (AROMASIN) 25 MG tablet TAKE 1 TABLET (25 MG TOTAL) BY MOUTH DAILY AFTER BREAKFAST.   simvastatin (ZOCOR) 20 MG tablet TAKE 1 TABLET BY MOUTH DAILY AT 6 PM.   triamterene-hydrochlorothiazide (MAXZIDE-25) 37.5-25 MG tablet TAKE 1 TABLET BY MOUTH EVERY DAY   [DISCONTINUED] ASPIRIN CHILDRENS 81 MG chewable tablet Chew 81 mg by mouth 2 (two) times daily.   No facility-administered encounter medications on file as of 11/23/2022.    Allergies (verified) Patient has no known allergies.   History: Past Medical History:  Diagnosis Date   Arthritis    Family history of bone cancer    Family history of colon cancer    Family history of leukemia    Family history of lung cancer    Family history of melanoma    Family history of ovarian cancer    Family history of stomach cancer    Family history of  uterine cancer    History of kidney stones    Hyperlipemia    Hypertension    [place]    right    radiation only   Lower extremity edema    Obesity    PAC (premature atrial contraction)    Past Surgical History:  Procedure Laterality Date   BREAST BIOPSY Left    BREAST LUMPECTOMY WITH RADIOACTIVE SEED AND SENTINEL LYMPH NODE BIOPSY Right 06/08/2020   Procedure: RIGHT BREAST LUMPECTOMY WITH RADIOACTIVE SEED AND SENTINEL LYMPH NODE MAPPING;  Surgeon: CErroll Luna MD;  [facility]  Service: General;  Laterality: Right;  PEC BLOCK   CHOLECYSTECTOMY     COLONOSCOPY  01/01/2012   Procedure: COLONOSCOPY;  Surgeon: RDaneil Dolin MD;  [facility];  Service: Endoscopy;  Laterality: N/A;  10:00 AM   COLONOSCOPY WITH PROPOFOL N/A 10/07/2020   Procedure: COLONOSCOPY WITH PROPOFOL;  Surgeon: RDaneil Dolin MD;  [facility];  Service: Endoscopy;  Laterality: N/A;  9:45am   JOINT REPLACEMENT  2006   left knee   POLYPECTOMY  10/07/2020   Procedure: POLYPECTOMY;  Surgeon: RDaneil Dolin MD;  [facility];  Service: Endoscopy;;   TOTAL KNEE ARTHROPLASTY Right 01/13/2022   Procedure: TOTAL KNEE ARTHROPLASTY;  Surgeon: MWillaim Sheng MD;  [facility];  Service: Orthopedics;  Laterality: Right;   US ECHOCARDIOGRAPHY  08/21/2011   LA mild to mod. dilated,mild MR,TR   Family History  Problem Relation Age of Onset   Hypertension Mother    Colon cancer Mother 66   Hypertension Father    Heart attack Father    Melanoma Father        dx. in his early 42s (x3)   Uterine cancer Maternal Aunt        dx. >50   Cancer Daughter 28       ovarian (dysgerminoma)   Leukemia Paternal Uncle        dx. in his late 22s   Stomach cancer Maternal Grandmother        dx. in her 81s   Bone cancer Maternal Aunt        dx. in her early 57s   Lung cancer Maternal Aunt    Testicular cancer Cousin        dx. in his late 30s/early  91s (maternal cousin)   Anesthesia problems Neg Hx    Social History   Socioeconomic History   Marital status: Married    Spouse name: Not on file   Number of children: 2   Years of education: Not on file   Highest education level: Not on file  Occupational History   Not on file  Tobacco Use   Smoking status: Never   Smokeless tobacco: Never  Vaping Use   Vaping Use: Never used  Substance and Sexual Activity   Alcohol use: No   Drug use: No   Sexual activity: Not Currently  Other Topics Concern   Not on file  Social History Narrative   Not on file   Social Determinants of Health   Financial Resource Strain: Low Risk  (11/23/2022)   Overall Financial Resource Strain (CARDIA)    Difficulty of Paying Living Expenses: Not hard at all  Food Insecurity: No Food Insecurity (11/23/2022)   Hunger Vital Sign    Worried About Running Out of Food in the Last Year: Never true    Ran Out of Food in the Last Year: Never true  Transportation Needs: No Transportation Needs (11/23/2022)   PRAPARE - Hydrologist (Medical): No    Lack of Transportation (Non-Medical): No  Physical Activity: Sufficiently Active (11/23/2022)   Exercise Vital Sign    Days of Exercise per Week: 5 days    Minutes of Exercise per Session: 30 min  Stress: No Stress Concern Present (11/23/2022)   [place]    Feeling of Stress : Not at all  Social Connections: Moderately Isolated (11/23/2022)   Social Connection and Isolation Panel [NHANES]    Frequency of Communication with Friends and Family: More than three times a week    Frequency of Social Gatherings with Friends and Family: Three times a week    Attends Religious Services: Never    Active Member of Clubs or Organizations: No    Attends Music therapist: Never    Marital Status: Married    Tobacco Counseling Counseling given: Not  Answered   Clinical Intake:  Pre-visit preparation completed: Yes  Pain : No/denies pain     BMI - recorded: 35.52 Diabetes: No  How often do you need to have someone help you when you read instructions, pamphlets, or other written materials from your doctor or pharmacy?: 1 - Never  Diabetic?No  Interpreter Needed?: No  Information entered by :: Denman George LPN   Activities of Daily Living    11/23/2022   10:00 AM 11/19/2022    8:43 AM  In your present state of health, do you have any difficulty performing the following activities:  Hearing? 0 0  Vision? 0 0  Difficulty concentrating or making decisions? 0 0  Walking or climbing stairs? 0 0  Dressing or bathing? 0 0  Doing errands, shopping? 0 0  Preparing Food and eating ? N N  Using the Toilet? N N  In the past six months, have you accidently leaked urine? N Y  Do you have problems with loss of bowel control? N N  Managing your Medications? N N  Managing your Finances? N N  Housekeeping or managing your Housekeeping? N N    Patient Care Team: [name], MD as PCP - General (Family Medicine) [name], RN as Oncology Nurse Navigator [name], RN as Oncology Nurse Navigator [name], MD as Consulting Physician (General Surgery) [name], MD as Consulting Physician (Hematology) [name], MD as Consulting Physician (Radiation Oncology) [name], NP as Nurse Practitioner (Nurse Practitioner)  Indicate any recent Medical Services you may have received from other than [facility] providers in the past year (date may be approximate).     Assessment:   This is a routine wellness examination for [place].  Hearing/Vision screen Hearing Screening - Comments:: No concerns   Vision Screening - Comments:: Up to date with routine eye exams, no vision problems, plans to establish with new provider in 2024   Dietary issues and exercise activities discussed: Current Exercise  Habits: Home exercise routine, Type of exercise: walking, Time (Minutes): 30, Frequency (Times/Week): 5, Weekly Exercise (Minutes/Week): 150, Intensity: Mild   Goals Addressed             This Visit's Progress    Patient Stated   On track    I am reducing bread, and potatoes in my diet       Depression Screen    11/23/2022    9:47 AM 06/10/2021   11:29 AM 04/02/2020    3:03 PM 12/20/2017    4:18 PM 06/11/2017   10:31 AM  PHQ 2/9 Scores  PHQ - 2 Score 0 0 0 0 0  PHQ- 9 Score     0    Fall Risk    11/23/2022    9:58 AM 11/19/2022    8:43 AM 06/10/2021   11:28 AM 04/02/2020    3:03 PM 12/20/2017    4:18 PM  Fall Risk   Falls in the past year? 0 0 0 0 No  Number falls in past yr: 0  0    Injury with Fall? 0  0    Risk for fall due to : No Fall Risks  No Fall Risks    Follow up Falls evaluation completed;Education provided;Falls prevention discussed  Falls evaluation completed;Falls prevention discussed Falls evaluation completed     FALL RISK PREVENTION PERTAINING TO THE HOME:  Any stairs in or around the home? Yes  If so, are there any without handrails? No  Home free of loose throw rugs in walkways, pet beds, electrical cords, etc? Yes  Adequate lighting in your home to reduce risk of falls? Yes   ASSISTIVE DEVICES UTILIZED TO PREVENT FALLS:  Life alert? No  Use of a cane, walker or w/c? No  Grab bars in the bathroom? Yes  Shower chair or bench in shower? No  Elevated toilet seat or a handicapped toilet? Yes   TIMED UP AND GO:  Was the test performed? Yes .  Length of time to ambulate 10 feet: 7 sec.   Gait steady and fast without use of assistive device  Cognitive Function:        11/23/2022   10:00 AM  6CIT Screen  What Year? 0 points  What month? 0 points  What time? 0 points  Count back from 20 0 points  Months in reverse 0 points  Repeat phrase 0 points  Total Score 0 points    Immunizations Immunization History  Administered Date(s)  Administered   Pneumococcal Conjugate-13 06/11/2017   Pneumococcal Polysaccharide-23 04/02/2020   Tdap 06/11/2017    TDAP status: Up to date  Flu Vaccine status: Declined, Education has been provided regarding the importance of this vaccine but patient still declined. Advised may receive this vaccine at local pharmacy or Health Dept. Aware to provide a copy of the vaccination record if obtained from local pharmacy or Health Dept. Verbalized acceptance and understanding.  Pneumococcal vaccine status: Up to date  Covid-19 vaccine status: Information provided on how to obtain vaccines.   Qualifies for Shingles Vaccine? Yes   Zostavax completed No   Shingrix Completed?: No.    Education has been provided regarding the importance of this vaccine. Patient has been advised to call insurance company to determine out of pocket expense if they have not yet received this vaccine. Advised may also receive vaccine at local pharmacy or Health Dept. Verbalized acceptance and understanding.  Screening Tests Health Maintenance  Topic Date Due   COVID-19 Vaccine (1) Never done   Zoster Vaccines- Shingrix (1 of 2) Never done   Hepatitis C Screening  12/25/2022 (Originally 09/18/1969)   INFLUENZA VACCINE  03/11/2023 (Originally 07/11/2022)   MAMMOGRAM  05/02/2023   Medicare Annual Wellness (AWV)  11/24/2023   DTaP/Tdap/Td (2 - Td or Tdap) 06/12/2027   COLONOSCOPY (Pts 45-99yr Insurance coverage will need to be confirmed)  10/07/2030   Pneumonia Vaccine 71 Years old  Completed   DEXA SCAN  Completed   HPV VACCINES  Aged Out    Health Maintenance  Health Maintenance Due  Topic Date Due   COVID-19 Vaccine (1) Never done   Zoster Vaccines- Shingrix (1 of 2) Never done    Colorectal cancer screening: Type of screening: Colonoscopy. Completed 10/07/20. Repeat every 10 years  Mammogram status: Completed 05/01/22. Repeat every year  Bone Density status: Completed 09/08/22. Results reflect: Bone  density results: NORMAL. Repeat every 7 years.  Lung Cancer Screening: (Low Dose CT Chest recommended if Age 71-80years, 30 pack-year currently smoking OR have quit w/in 15years.) does not qualify.   Lung Cancer Screening Referral: n/a  Additional Screening:  Hepatitis C Screening: does qualify; Completed at next office visit   Vision Screening: Recommended annual ophthalmology exams for early detection of glaucoma and other disorders of the eye. Is the patient up to date with their annual eye exam?  Yes  Who is the provider or what is the name of the office in which the patient attends annual eye exams? Plans to establish with new provider in 2024 If pt is not established with a provider, would they like to be referred to a provider to establish care? No .   Dental Screening: Recommended annual dental exams for proper oral hygiene  Community Resource Referral / Chronic Care Management: CRR required this visit?  No   CCM required this visit?  No      Plan:     I have personally reviewed and noted the following in the patient's chart:   Medical and social history Use of alcohol, tobacco or illicit drugs  Current medications and supplements including opioid prescriptions. Patient is not currently taking opioid prescriptions. Functional ability and status Nutritional status Physical activity Advanced directives List of other physicians Hospitalizations, surgeries, and ER visits in previous 12 months Vitals Screenings to include cognitive, depression, and falls Referrals and appointments  In addition, I have reviewed and discussed with patient certain preventive protocols, quality metrics, and best practice recommendations. A written personalized care plan for preventive services as well as general preventive health recommendations were provided to patient.     Denman George [place], [place]   76/16/0737   Nurse Notes: No concerns

## 2022-11-23 NOTE — Patient Instructions (Signed)
Ms. Carda , Thank you for taking time to come for your Medicare Wellness Visit. I appreciate your ongoing commitment to your health goals. Please review the following plan we discussed and let me know if I can assist you in the future.   These are the goals we discussed:  Goals      Patient Stated     I am reducing bread, and potatoes in my diet      Weight (lb) < 160 lb (72.6 kg)        This is a list of the screening recommended for you and due dates:  Health Maintenance  Topic Date Due   COVID-19 Vaccine (1) Never done   Hepatitis C Screening: USPSTF Recommendation to screen - Ages 81-79 yo.  Never done   Zoster (Shingles) Vaccine (1 of 2) Never done   Medicare Annual Wellness Visit  06/10/2022   Flu Shot  Never done   Mammogram  05/02/2023   DTaP/Tdap/Td vaccine (2 - Td or Tdap) 06/12/2027   Colon Cancer Screening  10/07/2030   Pneumonia Vaccine  Completed   DEXA scan (bone density measurement)  Completed   HPV Vaccine  Aged Out    Advanced directives: Advance directive discussed with you today. I have provided a copy for you to complete at home and have notarized. Once this is complete please bring a copy in to our office so we can scan it into your chart.   Conditions/risks identified: Aim for 30 minutes of exercise or brisk walking, 6-8 glasses of water, and 5 servings of fruits and vegetables each day.   Next appointment: Follow up in one year for your annual wellness visit    Preventive Care 65 Years and Older, Female Preventive care refers to lifestyle choices and visits with your health care provider that can promote health and wellness. What does preventive care include? A yearly physical exam. This is also called an annual well check. Dental exams once or twice a year. Routine eye exams. Ask your health care provider how often you should have your eyes checked. Personal lifestyle choices, including: Daily care of your teeth and gums. Regular physical  activity. Eating a healthy diet. Avoiding tobacco and drug use. Limiting alcohol use. Practicing safe sex. Taking low-dose aspirin every day. Taking vitamin and mineral supplements as recommended by your health care provider. What happens during an annual well check? The services and screenings done by your health care provider during your annual well check will depend on your age, overall health, lifestyle risk factors, and family history of disease. Counseling  Your health care provider may ask you questions about your: Alcohol use. Tobacco use. Drug use. Emotional well-being. Home and relationship well-being. Sexual activity. Eating habits. History of falls. Memory and ability to understand (cognition). Work and work Statistician. Reproductive health. Screening  You may have the following tests or measurements: Height, weight, and BMI. Blood pressure. Lipid and cholesterol levels. These may be checked every 5 years, or more frequently if you are over 83 years old. Skin check. Lung cancer screening. You may have this screening every year starting at age 67 if you have a 30-pack-year history of smoking and currently smoke or have quit within the past 15 years. Fecal occult blood test (FOBT) of the stool. You may have this test every year starting at age 66. Flexible sigmoidoscopy or colonoscopy. You may have a sigmoidoscopy every 5 years or a colonoscopy every 10 years starting at age 34. Hepatitis C  blood test. Hepatitis B blood test. Sexually transmitted disease (STD) testing. Diabetes screening. This is done by checking your blood sugar (glucose) after you have not eaten for a while (fasting). You may have this done every 1-3 years. Bone density scan. This is done to screen for osteoporosis. You may have this done starting at age 53. Mammogram. This may be done every 1-2 years. Talk to your health care provider about how often you should have regular mammograms. Talk with your  health care provider about your test results, treatment options, and if necessary, the need for more tests. Vaccines  Your health care provider may recommend certain vaccines, such as: Influenza vaccine. This is recommended every year. Tetanus, diphtheria, and acellular pertussis (Tdap, Td) vaccine. You may need a Td booster every 10 years. Zoster vaccine. You may need this after age 73. Pneumococcal 13-valent conjugate (PCV13) vaccine. One dose is recommended after age 18. Pneumococcal polysaccharide (PPSV23) vaccine. One dose is recommended after age 72. Talk to your health care provider about which screenings and vaccines you need and how often you need them. This information is not intended to replace advice given to you by your health care provider. Make sure you discuss any questions you have with your health care provider. Document Released: 12/24/2015 Document Revised: 08/16/2016 Document Reviewed: 09/28/2015 Elsevier Interactive Patient Education  2017 [employer] Prevention in the Home Falls can cause injuries. They can happen to people of all ages. There are many things you can do to make your home safe and to help prevent falls. What can I do on the outside of my home? Regularly fix the edges of walkways and driveways and fix any cracks. Remove anything that might make you trip as you walk through a door, such as a raised step or threshold. Trim any bushes or trees on the path to your home. Use bright outdoor lighting. Clear any walking paths of anything that might make someone trip, such as rocks or tools. Regularly check to see if handrails are loose or broken. Make sure that both sides of any steps have handrails. Any raised decks and porches should have guardrails on the edges. Have any leaves, snow, or ice cleared regularly. Use sand or salt on walking paths during winter. Clean up any spills in your garage right away. This includes oil or grease spills. What can I  do in the bathroom? Use night lights. Install grab bars by the toilet and in the tub and shower. Do not use towel bars as grab bars. Use non-skid mats or decals in the tub or shower. If you need to sit down in the shower, use a plastic, non-slip stool. Keep the floor dry. Clean up any water that spills on the floor as soon as it happens. Remove soap buildup in the tub or shower regularly. Attach bath mats securely with double-sided non-slip rug tape. Do not have throw rugs and other things on the floor that can make you trip. What can I do in the bedroom? Use night lights. Make sure that you have a light by your bed that is easy to reach. Do not use any sheets or blankets that are too big for your bed. They should not hang down onto the floor. Have a firm chair that has side arms. You can use this for support while you get dressed. Do not have throw rugs and other things on the floor that can make you trip. What can I do in the kitchen?  Clean up any spills right away. Avoid walking on wet floors. Keep items that you use a lot in easy-to-reach places. If you need to reach something above you, use a strong step stool that has a grab bar. Keep electrical cords out of the way. Do not use floor polish or wax that makes floors slippery. If you must use wax, use non-skid floor wax. Do not have throw rugs and other things on the floor that can make you trip. What can I do with my stairs? Do not leave any items on the stairs. Make sure that there are handrails on both sides of the stairs and use them. Fix handrails that are broken or loose. Make sure that handrails are as long as the stairways. Check any carpeting to make sure that it is firmly attached to the stairs. Fix any carpet that is loose or worn. Avoid having throw rugs at the top or bottom of the stairs. If you do have throw rugs, attach them to the floor with carpet tape. Make sure that you have a light switch at the top of the stairs  and the bottom of the stairs. If you do not have them, ask someone to add them for you. What else can I do to help prevent falls? Wear shoes that: Do not have high heels. Have rubber bottoms. Are comfortable and fit you well. Are closed at the toe. Do not wear sandals. If you use a stepladder: Make sure that it is fully opened. Do not climb a closed stepladder. Make sure that both sides of the stepladder are locked into place. Ask someone to hold it for you, if possible. Clearly mark and make sure that you can see: Any grab bars or handrails. First and last steps. Where the edge of each step is. Use tools that help you move around (mobility aids) if they are needed. These include: Canes. Walkers. Scooters. Crutches. Turn on the lights when you go into a dark area. Replace any light bulbs as soon as they burn out. Set up your furniture so you have a clear path. Avoid moving your furniture around. If any of your floors are uneven, fix them. If there are any pets around you, be aware of where they are. Review your medicines with your doctor. Some medicines can make you feel dizzy. This can increase your chance of falling. Ask your doctor what other things that you can do to help prevent falls. This information is not intended to replace advice given to you by your health care provider. Make sure you discuss any questions you have with your health care provider. Document Released: 09/23/2009 Document Revised: 05/04/2016 Document Reviewed: 01/01/2015 Elsevier Interactive Patient Education  2017 [employer].

## 2022-12-21 ENCOUNTER — Encounter: Payer: Self-pay | Admitting: Nurse Practitioner

## 2022-12-21 ENCOUNTER — Inpatient Hospital Stay (HOSPITAL_BASED_OUTPATIENT_CLINIC_OR_DEPARTMENT_OTHER): Payer: Medicare PPO | Admitting: Nurse Practitioner

## 2022-12-21 ENCOUNTER — Inpatient Hospital Stay: Payer: Medicare PPO | Attending: Hematology

## 2022-12-21 VITALS — BP 134/78 | HR 77 | Temp 98.0°F | Resp 17 | Wt 187.4 lb

## 2022-12-21 DIAGNOSIS — C50911 Malignant neoplasm of unspecified site of right female breast: Secondary | ICD-10-CM | POA: Diagnosis not present

## 2022-12-21 DIAGNOSIS — Z79811 Long term (current) use of aromatase inhibitors: Secondary | ICD-10-CM | POA: Insufficient documentation

## 2022-12-21 DIAGNOSIS — G4700 Insomnia, unspecified: Secondary | ICD-10-CM | POA: Diagnosis not present

## 2022-12-21 DIAGNOSIS — M1990 Unspecified osteoarthritis, unspecified site: Secondary | ICD-10-CM | POA: Diagnosis not present

## 2022-12-21 DIAGNOSIS — C50111 Malignant neoplasm of central portion of right female breast: Secondary | ICD-10-CM | POA: Diagnosis not present

## 2022-12-21 DIAGNOSIS — Z170 Estrogen receptor positive status [ER+]: Secondary | ICD-10-CM | POA: Diagnosis not present

## 2022-12-21 LAB — CBC WITH DIFFERENTIAL (CANCER CENTER ONLY)
Abs Immature Granulocytes: 0.05 10*3/uL (ref 0.00–0.07)
Basophils Absolute: 0.1 10*3/uL (ref 0.0–0.1)
Basophils Relative: 1 %
Eosinophils Absolute: 0.4 10*3/uL (ref 0.0–0.5)
Eosinophils Relative: 4 %
HCT: 39.7 % (ref 36.0–46.0)
Hemoglobin: 13.7 g/dL (ref 12.0–15.0)
Immature Granulocytes: 1 %
Lymphocytes Relative: 30 %
Lymphs Abs: 2.6 10*3/uL (ref 0.7–4.0)
MCH: 28.8 pg (ref 26.0–34.0)
MCHC: 34.5 g/dL (ref 30.0–36.0)
MCV: 83.4 fL (ref 80.0–100.0)
Monocytes Absolute: 0.3 10*3/uL (ref 0.1–1.0)
Monocytes Relative: 4 %
Neutro Abs: 5.2 10*3/uL (ref 1.7–7.7)
Neutrophils Relative %: 60 %
Platelet Count: 201 10*3/uL (ref 150–400)
RBC: 4.76 MIL/uL (ref 3.87–5.11)
RDW: 13.6 % (ref 11.5–15.5)
WBC Count: 8.6 10*3/uL (ref 4.0–10.5)
nRBC: 0 % (ref 0.0–0.2)

## 2022-12-21 LAB — CMP (CANCER CENTER ONLY)
ALT: 15 U/L (ref 0–44)
AST: 17 U/L (ref 15–41)
Albumin: 4.5 g/dL (ref 3.5–5.0)
Alkaline Phosphatase: 74 U/L (ref 38–126)
Anion gap: 8 (ref 5–15)
BUN: 20 mg/dL (ref 8–23)
CO2: 28 mmol/L (ref 22–32)
Calcium: 10.2 mg/dL (ref 8.9–10.3)
Chloride: 104 mmol/L (ref 98–111)
Creatinine: 0.74 mg/dL (ref 0.44–1.00)
GFR, Estimated: >60 mL/min (ref >60)
Glucose, Bld: 91 mg/dL (ref 70–99)
Potassium: 3.9 mmol/L (ref 3.5–5.1)
Sodium: 140 mmol/L (ref 135–145)
Total Bilirubin: 1.5 mg/dL — ABNORMAL HIGH (ref 0.3–1.2)
Total Protein: 7.4 g/dL (ref 6.5–8.1)

## 2022-12-21 NOTE — Progress Notes (Signed)
Patient Care Team: [name], MD as PCP - General (Family Medicine) [name], RN as Oncology Nurse Navigator [name], RN as Oncology Nurse Navigator [name], MD as Consulting Physician (General Surgery) [name], MD as Consulting Physician (Hematology) [name], MD as Consulting Physician (Radiation Oncology) [name], NP as Nurse Practitioner (Nurse Practitioner)   CHIEF COMPLAINT: Follow-up right breast cancer  Oncology History Overview Note  Cancer Staging malignant neoplasm right central breast Staging form: Breast, AJCC 8th Edition - Clinical stage from 05/04/2020: Stage IA (cT1, cN0, cM0, G2, ER+, PR+, HER2-) - Signed by [name], NP on 05/24/2020 - Pathologic stage from 06/08/2020: Stage IA (pT1c, pN0, cM0, G2, ER+, PR+, HER2-, Oncotype DX score: 24) - Signed by [name], MD on 08/22/2020    malignant neoplasm right central breast  04/27/2020 Breast US   FINDINGS: Mammogram: Spot compression tomosynthesis views of the right breast were performed. There is persistence of architectural distortion in the lower central to slightly outer right breast. The area of distortion spans approximately 0.9 cm. Ultrasound: Targeted ultrasound is performed in the right breast at 6 o'clock 1 cm from the nipple demonstrating an irregular hypoechoic mass measuring 0.8 x 0.6 x 0.6 cm. The surrounding tissues are distorted. No internal blood flow identified. Targeted ultrasound of the right axilla demonstrates normal-appearing lymph nodes.   IMPRESSION: Right breast mass at 6 o'clock measuring 0.8 cm is suspicious and likely corresponds to the distortion seen mammographically.   05/04/2020 Cancer Staging   Staging form: Breast, AJCC 8th Edition - Clinical stage from 05/04/2020: Stage IA (cT1, cN0, cM0, G2, ER+, PR+, HER2-) - Signed by [name], NP on 05/24/2020   05/04/2020 Initial Biopsy   FINAL MICROSCOPIC DIAGNOSIS:  A.  BREAST, RIGHT/6:00, BIOPSY:  - Invasive ductal carcinoma.  - Ductal carcinoma in situ.  - Complex sclerosing lesion. COMMENT:  The carcinoma appears grade 2.  The greatest linear extent of tumor in any one core is 7 mm.   Immunohistochemical and morphometric analysis performed manually  The tumor cells are NEGATIVE for Her2 (1+).  Estrogen Receptor:       POSITIVE, 90% STRONG STAINING  Progesterone Receptor:   POSITIVE, 90% STRONG STAINING  Proliferation Marker Ki-67:   10%    05/06/2020 Initial Diagnosis   malignant neoplasm right central breast   06/02/2020 Genetic Testing   Negative genetic testing:  No pathogenic variants detected on the [facility] Breast Cancer STAT panel + Common Hereditary Cancers Panel. A variant of uncertain significance (VUS) was detected in the AXIN2 gene called c.1235A>C. The report date is 06/02/2020.  The Breast Cancer STAT Panel offered by [facility] includes sequencing and deletion/duplication analysis for the following 9 genes:  ATM, BRCA1, BRCA2, CDH1, CHEK2, PALB2, PTEN, STK11 and TP53. The Common Hereditary Cancers Panel offered by [facility] includes sequencing and/or deletion duplication testing of the following 48 genes: APC, ATM, AXIN2, BARD1, BMPR1A, BRCA1, BRCA2, BRIP1, CDH1, CDK4, CDKN2A (p14ARF), CDKN2A (p16INK4a), CHEK2, CTNNA1, DICER1, EPCAM (Deletion/duplication testing only), GREM1 (promoter region deletion/duplication testing only), KIT, MEN1, MLH1, MSH2, MSH3, MSH6, MUTYH, NBN, NF1, NHTL1, PALB2, PDGFRA, PMS2, POLD1, POLE, PTEN, RAD50, RAD51C, RAD51D, RNF43, SDHB, SDHC, SDHD, SMAD4, SMARCA4. STK11, TP53, TSC1, TSC2, and VHL.  The following genes were evaluated for sequence changes only: SDHA and HOXB13 c.251G>A variant only.   06/08/2020 Cancer Staging   Staging form: Breast, AJCC 8th Edition - Pathologic stage from 06/08/2020: Stage IA (pT1c, pN0, cM0, G2,  ER+, PR+, HER2-, Oncotype DX score: 24) - Signed by [name], MD on 08/22/2020   06/08/2020 Surgery    RIGHT BREAST LUMPECTOMY WITH RADIOACTIVE SEED AND SENTINEL LYMPH NODE MAPPING with Dr Brantley Stage    06/08/2020 Pathology Results   FINAL MICROSCOPIC DIAGNOSIS:   A. BREAST, RIGHT, LUMPECTOMY:  - Invasive ductal carcinoma, grade 2, spanning 1.1 cm.  - High grade ductal carcinoma in situ with necrosis.  - Biopsy site.  - Resection margins negative for invasive carcinoma.  - In situ carcinoma is <0.1 cm to the posterior margin broadly, see  comment.  - See oncology table.   B. LYMPH NODE, RIGHT AXILLARY, SENTINEL, EXCISION:  - One of one lymph nodes negative for carcinoma (0/1).    06/08/2020 Oncotype testing   Recurrence score 24  Distant recurrence risk at 9 years with AI or Tamoxifen alone is 10% There is less than 1% benefit of adjuvant chemotherapy.    07/26/2020 - 08/25/2020 Radiation Therapy   Adjuvant Radiation with Dr [name] 07/26/20-08/25/20   09/2020 -  Anti-estrogen oral therapy   Exemestane '25mg'$  once daily starting in 09/2020   11/22/2020 Survivorship   SCP delivered by Cira Rue, NP       CURRENT THERAPY: Exemestane, starting 09/2020  INTERVAL HISTORY [name] returns for follow-up as scheduled, last seen by me 06/21/2022.  She underwent abdominal ultrasound to follow-up elevated bilirubin, which showed mildly increased echogenicity, no lesion.  She also had DEXA scan 09/08/2022 which showed normal bone density.  Continues exemestane, tolerating well with some joint aches in her arms and knees. Very manageable with NSAIDs. No functional difficulties. Denies breast concerns, hot flashes, any pain, or other specific complaints.  ROS  All other systems reviewed and negative  Past Medical History:  Diagnosis Date   Arthritis    Family history of bone cancer    Family history of colon cancer    Family history of leukemia    Family history of lung cancer    Family history of melanoma    Family history of ovarian cancer    Family history of stomach cancer    Family  history of uterine cancer    History of kidney stones    Hyperlipemia    Hypertension    Invasive ductal carcinoma of breast ([place])    right    radiation only   Lower extremity edema    Obesity    PAC (premature atrial contraction)      Past Surgical History:  Procedure Laterality Date   BREAST BIOPSY Left    BREAST LUMPECTOMY WITH RADIOACTIVE SEED AND SENTINEL LYMPH NODE BIOPSY Right 06/08/2020   Procedure: RIGHT BREAST LUMPECTOMY WITH RADIOACTIVE SEED AND SENTINEL LYMPH NODE MAPPING;  Surgeon: [name], MD;  Location: [place];  Service: General;  Laterality: Right;  PEC BLOCK   CHOLECYSTECTOMY     COLONOSCOPY  01/01/2012   Procedure: COLONOSCOPY;  Surgeon: Daneil Dolin, MD;  [facility];  Service: Endoscopy;  Laterality: N/A;  10:00 AM   COLONOSCOPY WITH PROPOFOL N/A 10/07/2020   Procedure: COLONOSCOPY WITH PROPOFOL;  Surgeon: Daneil Dolin, MD;  [facility];  Service: Endoscopy;  Laterality: N/A;  9:45am   JOINT REPLACEMENT  2006   left knee   POLYPECTOMY  10/07/2020   Procedure: POLYPECTOMY;  Surgeon: Daneil Dolin, MD;  [facility];  Service: Endoscopy;;   TOTAL KNEE ARTHROPLASTY Right 01/13/2022   Procedure: TOTAL KNEE ARTHROPLASTY;  Surgeon:  Willaim Sheng, MD;  Location: [facility];  Service: Orthopedics;  Laterality: Right;   US ECHOCARDIOGRAPHY  08/21/2011   LA mild to mod. dilated,mild MR,TR     Outpatient Encounter Medications as of 12/21/2022  Medication Sig   cholecalciferol (VITAMIN D) 25 MCG (1000 UNIT) tablet Take 2,000 Units by mouth daily.   exemestane (AROMASIN) 25 MG tablet TAKE 1 TABLET (25 MG TOTAL) BY MOUTH DAILY AFTER BREAKFAST.   simvastatin (ZOCOR) 20 MG tablet TAKE 1 TABLET BY MOUTH DAILY AT 6 PM.   triamterene-hydrochlorothiazide (MAXZIDE-25) 37.5-25 MG tablet TAKE 1 TABLET BY MOUTH EVERY DAY   No facility-administered encounter medications on file as of 12/21/2022.     Today's Vitals   12/21/22 1033  12/21/22 1041  BP: (!) 162/89 134/78  Pulse: 77   Resp: 17   Temp: 98 F (36.7 C)   TempSrc: Oral   SpO2: 96%   Weight: 187 lb 6.4 oz (85 kg)   PainSc:  0-No pain   Body mass index is 35.41 kg/m.   PHYSICAL EXAM GENERAL:alert, no distress and comfortable SKIN: no rash  EYES: sclera clear NECK: without mass LYMPH:  no palpable cervical or supraclavicular lymphadenopathy  LUNGS:  normal breathing effort HEART: regular rate & rhythm, mild bilateral lower extremity edema ABDOMEN: abdomen soft, non-tender and normal bowel sounds NEURO: alert & oriented x 3 with fluent speech Breast exam: No bilateral nipple discharge or inversion.  S/p right lumpectomy, incisions completely healed.  No palpable mass or nodularity in either breast or axilla that I could appreciate.   CBC    Component Value Date/Time   WBC 8.6 12/21/2022 1001   WBC 11.0 (H) 01/14/2022 0308   RBC 4.76 12/21/2022 1001   HGB 13.7 12/21/2022 1001   HCT 39.7 12/21/2022 1001   PLT 201 12/21/2022 1001   MCV 83.4 12/21/2022 1001   MCH 28.8 12/21/2022 1001   MCHC 34.5 12/21/2022 1001   RDW 13.6 12/21/2022 1001   LYMPHSABS 2.6 12/21/2022 1001   MONOABS 0.3 12/21/2022 1001   EOSABS 0.4 12/21/2022 1001   BASOSABS 0.1 12/21/2022 1001     CMP     Component Value Date/Time   NA 140 12/21/2022 1001   K 3.9 12/21/2022 1001   CL 104 12/21/2022 1001   CO2 28 12/21/2022 1001   GLUCOSE 91 12/21/2022 1001   BUN 20 12/21/2022 1001   CREATININE 0.74 12/21/2022 1001   CREATININE 0.73 12/19/2021 1159   CALCIUM 10.2 12/21/2022 1001   PROT 7.4 12/21/2022 1001   ALBUMIN 4.5 12/21/2022 1001   AST 17 12/21/2022 1001   ALT 15 12/21/2022 1001   ALKPHOS 74 12/21/2022 1001   BILITOT 1.5 (H) 12/21/2022 1001   GFRNONAA >60 12/21/2022 1001   GFRNONAA 65 03/10/2021 1122   GFRAA 76 03/10/2021 1122     ASSESSMENT & PLAN: [name] is a 72 y.o. female with    1. Invasive ductal carcinoma and DCIS of the central right  breast, ER+/PR+/HER2-, Grade 2, pT1cN0M0, Oncotype RS 24 -Diagnosed in 04/2020 with 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 -S/p right breast lumpectomy and SLNB with Dr Brantley Stage on 06/08/20. Her RS was 24.  Adjuvant chemotherapy was not recommended, she completed adjuvant Radiation. -She began antiestrogen therapy with Exemestane in 09/2020.  -[name] is clinically doing well.  Tolerating exemestane with mild joint pain, manageable with NSAIDs.  Breast exam is benign, labs are unremarkable.  Overall no clinical concern for  breast cancer recurrence -Continue surveillance and exemestane -Next mammogram 04/2023 -Follow-up in 6 months, or sooner if needed   2.  Hyperbilirubinemia, RLQ and flank pain -She has had mild hyperbilirubinemia since 2018, highest 1.8 in 06/2022 -For about 1 year she has had intermittent right lower quadrant and flank pain.  She does not use Tylenol or alcohol.   -Only available abdominal imaging is a CT from 12/11/2011 that shows suspected hepatic steatosis, 2 nonobstructing right renal calculi, and s/p cholecystectomy -Abdominal ultrasound 06/29/2022 showed mildly increased echogenicity which may resent hepatic steatosis, no lesions or concern for malignancy  -T. bili 1.5 today, improved. Denies pain   3. Genetic Testing negative for pathogenetic mutations.   4. Bone Health -Normal DEXA is in 06/2017, 09/07/20, most recently 09/08/2022 which I reviewed today -She is taking vitamin D, not taking calcium due to intermittent hypercalcemia -Continue monitoring   5. Arthritis, insomnia  -She has chronic arthritis in her knees, hips and back. -Stable  PLAN: -Recent DEXA and today's labs reviewed -Continue breast cancer surveillance and exemestane -Continue PCP follow-up and age-appropriate health maintenance -Next mammogram 04/2023 -Follow-up in 6 months, or sooner if needed  Orders Placed This Encounter  Procedures   MM DIAG BREAST TOMO BILATERAL     Standing Status:   Future    Standing Expiration Date:   12/22/2023    Order Specific Question:   Reason for Exam (SYMPTOM  OR DIAGNOSIS REQUIRED)    Answer:   h/o R breast cancer 2021    Order Specific Question:   Preferred imaging location?    Answer:   [facility]      All questions were answered. The patient knows to call the clinic with any problems, questions or concerns. No barriers to learning were detected.   Cira Rue, NP-C 12/21/2022

## 2023-01-02 ENCOUNTER — Ambulatory Visit (INDEPENDENT_AMBULATORY_CARE_PROVIDER_SITE_OTHER): Payer: Medicare PPO | Admitting: Family Medicine

## 2023-01-02 ENCOUNTER — Encounter: Payer: Self-pay | Admitting: Family Medicine

## 2023-01-02 VITALS — BP 132/76 | HR 72 | Temp 98.0°F | Ht 61.0 in | Wt 187.0 lb

## 2023-01-02 DIAGNOSIS — I10 Essential (primary) hypertension: Secondary | ICD-10-CM

## 2023-01-02 DIAGNOSIS — Z853 Personal history of malignant neoplasm of breast: Secondary | ICD-10-CM

## 2023-01-02 DIAGNOSIS — E7800 Pure hypercholesterolemia, unspecified: Secondary | ICD-10-CM

## 2023-01-02 NOTE — Progress Notes (Signed)
Subjective:    Patient ID: [name], female    DOB: [birth year], 72 y.o.   MRN: [id number]  HPI Patient is a very pleasant 72 year old Caucasian female with a history of breast cancer in the right breast.  This was treated with lumpectomy and radiation.  She has a repeat mammogram scheduled in May.  She is currently on Aromasin.  Her last colonoscopy was in 2021.  They did find 1 tubular adenoma.  Due to a family history of colon cancer in her mother they recommended a repeat colonoscopy in 5 years.  She had a bone density test last year and her T-score was -0.8.  She is due shot, COVID shot, RSV, and shingles.  I strongly encouraged the patient to consider the flu shot, COVID, and shingles.  She politely defers these at the present time.  Otherwise she is doing well except for some neuropathy in her lower legs.  She complains of some numbness over the lateral part of her shins bilaterally that radiates down her leg into her toes.  It seems to originate around the fibular head bilaterally and stays on the lateral aspect of her feet.  She denies any foot drop or leg weakness.  We discussed a referral for an MRI to evaluate for a pinched nerve but she defers this at the present time. Past Medical History:  Diagnosis Date   Arthritis    Family history of bone cancer    Family history of colon cancer    Family history of leukemia    Family history of lung cancer    Family history of melanoma    Family history of ovarian cancer    Family history of stomach cancer    Family history of uterine cancer    History of kidney stones    Hyperlipemia    Hypertension    Invasive ductal carcinoma of breast ([place])    right    radiation only   Lower extremity edema    Obesity    PAC (premature atrial contraction)    Past Surgical History:  Procedure Laterality Date   BREAST BIOPSY Left    BREAST LUMPECTOMY WITH RADIOACTIVE SEED AND SENTINEL LYMPH NODE BIOPSY Right 06/08/2020   Procedure: RIGHT  BREAST LUMPECTOMY WITH RADIOACTIVE SEED AND SENTINEL LYMPH NODE MAPPING;  Surgeon: [name], MD;  Location: [place];  Service: General;  Laterality: Right;  PEC BLOCK   CHOLECYSTECTOMY     COLONOSCOPY  01/01/2012   Procedure: COLONOSCOPY;  Surgeon: Daneil Dolin, MD;  [facility];  Service: Endoscopy;  Laterality: N/A;  10:00 AM   COLONOSCOPY WITH PROPOFOL N/A 10/07/2020   Procedure: COLONOSCOPY WITH PROPOFOL;  Surgeon: Daneil Dolin, MD;  [facility];  Service: Endoscopy;  Laterality: N/A;  9:45am   JOINT REPLACEMENT  2006   left knee   POLYPECTOMY  10/07/2020   Procedure: POLYPECTOMY;  Surgeon: Daneil Dolin, MD;  [facility];  Service: Endoscopy;;   TOTAL KNEE ARTHROPLASTY Right 01/13/2022   Procedure: TOTAL KNEE ARTHROPLASTY;  Surgeon: Willaim Sheng, MD;  Location[facility];  Service: Orthopedics;  Laterality: Right;   US ECHOCARDIOGRAPHY  08/21/2011   LA mild to mod. dilated,mild MR,TR   Current Outpatient Medications on File Prior to Visit  Medication Sig Dispense Refill   cholecalciferol (VITAMIN D) 25 MCG (1000 UNIT) tablet Take 2,000 Units by mouth daily.     exemestane (AROMASIN) 25 MG tablet TAKE 1 TABLET (  25 MG TOTAL) BY MOUTH DAILY AFTER BREAKFAST. 90 tablet 3   simvastatin (ZOCOR) 20 MG tablet TAKE 1 TABLET BY MOUTH DAILY AT 6 PM. 90 tablet 3   triamterene-hydrochlorothiazide (MAXZIDE-25) 37.5-25 MG tablet TAKE 1 TABLET BY MOUTH EVERY DAY 90 tablet 3   No current facility-administered medications on file prior to visit.   No Known Allergies Social History   Socioeconomic History   Marital status: Married    Spouse name: Not on file   Number of children: 2   Years of education: Not on file   Highest education level: Not on file  Occupational History   Not on file  Tobacco Use   Smoking status: Never   Smokeless tobacco: Never  Vaping Use   Vaping Use: Never used  Substance and Sexual Activity   Alcohol use: No    Drug use: No   Sexual activity: Not Currently  Other Topics Concern   Not on file  Social History Narrative   Not on file   Social Determinants of Health   Financial Resource Strain: Low Risk  (11/23/2022)   Overall Financial Resource Strain (CARDIA)    Difficulty of Paying Living Expenses: Not hard at all  Food Insecurity: No Food Insecurity (11/23/2022)   Hunger Vital Sign    Worried About Running Out of Food in the Last Year: Never true    Ran Out of Food in the Last Year: Never true  Transportation Needs: No Transportation Needs (11/23/2022)   PRAPARE - Hydrologist (Medical): No    Lack of Transportation (Non-Medical): No  Physical Activity: Sufficiently Active (11/23/2022)   Exercise Vital Sign    Days of Exercise per Week: 5 days    Minutes of Exercise per Session: 30 min  Stress: No Stress Concern Present (11/23/2022)   [place]    Feeling of Stress : Not at all  Social Connections: Moderately Isolated (11/23/2022)   Social Connection and Isolation Panel [NHANES]    Frequency of Communication with Friends and Family: More than three times a week    Frequency of Social Gatherings with Friends and Family: Three times a week    Attends Religious Services: Never    Active Member of Clubs or Organizations: No    Attends Archivist Meetings: Never    Marital Status: Married  Human resources officer Violence: Not At Risk (11/23/2022)   Humiliation, Afraid, Rape, and Kick questionnaire    Fear of Current or Ex-Partner: No    Emotionally Abused: No    Physically Abused: No    Sexually Abused: No     Review of Systems  All other systems reviewed and are negative.      Objective:   Physical Exam Vitals reviewed. Exam conducted with a chaperone present.  Constitutional:      General: She is not in acute distress.    Appearance: Normal appearance. She is normal weight.  She is not ill-appearing, toxic-appearing or diaphoretic.  HENT:     Head: Normocephalic and atraumatic.     Right Ear: Tympanic membrane, ear canal and external ear normal. There is no impacted cerumen.     Left Ear: Tympanic membrane, ear canal and external ear normal. There is no impacted cerumen.     Nose: Nose normal. No congestion or rhinorrhea.     Mouth/Throat:     Mouth: Mucous membranes are moist.     Pharynx: Oropharynx is  clear. No oropharyngeal exudate or posterior oropharyngeal erythema.  Eyes:     General: No scleral icterus.       Right eye: No discharge.        Left eye: No discharge.     Extraocular Movements: Extraocular movements intact.     Conjunctiva/sclera: Conjunctivae normal.     Pupils: Pupils are equal, round, and reactive to light.  Neck:     Vascular: No carotid bruit.  Cardiovascular:     Rate and Rhythm: Normal rate and regular rhythm.     Pulses: Normal pulses.     Heart sounds: Normal heart sounds. No murmur heard.    No friction rub. No gallop.  Pulmonary:     Effort: Pulmonary effort is normal. No respiratory distress.     Breath sounds: Normal breath sounds. No stridor. No wheezing, rhonchi or rales.  Chest:     Chest wall: No tenderness.  Abdominal:     General: Abdomen is flat. Bowel sounds are normal. There is no distension.     Palpations: Abdomen is soft. There is no mass.     Tenderness: There is no abdominal tenderness. There is no right CVA tenderness, left CVA tenderness, guarding or rebound.     Hernia: No hernia is present.  Genitourinary:    General: Normal vulva.     Exam position: Lithotomy position.     Labia:        Right: No rash.        Left: No rash.      Vagina: Normal. No erythema or lesions.     Cervix: No cervical motion tenderness, friability, lesion or erythema.     Uterus: Normal.      Adnexa: Right adnexa normal and left adnexa normal.       Right: No mass.         Left: No mass.    Musculoskeletal:      Cervical back: Normal range of motion and neck supple. No rigidity.     Right lower leg: No edema.     Left lower leg: No edema.  Lymphadenopathy:     Cervical: No cervical adenopathy.  Skin:    General: Skin is warm.     Coloration: Skin is not jaundiced or pale.     Findings: No bruising, erythema, lesion or rash.  Neurological:     General: No focal deficit present.     Mental Status: She is alert. Mental status is at baseline.     Cranial Nerves: No cranial nerve deficit.     Sensory: No sensory deficit.     Motor: No weakness.     Coordination: Coordination normal.     Gait: Gait normal.     Deep Tendon Reflexes: Reflexes normal.  Psychiatric:        Mood and Affect: Mood normal.        Behavior: Behavior normal.        Thought Content: Thought content normal.        Judgment: Judgment normal.           Assessment & Plan:  Pure hypercholesterolemia - Plan: Lipid panel  Benign essential HTN  History of breast cancer Mammogram has been scheduled, colonoscopy is up-to-date, bone density is up-to-date.  Recommended shingles vaccine, RSV, and a COVID booster along with a flu shot.  She politely declined these.  Reviewed her CMP and her CBC recently obtained at the cancer center which were normal aside from signs  of Gill Bears syndrome.  Recommended checking a fasting lipid panel.  Blood pressure today is excellent.  I do believe the patient is developing peripheral neuropathy.  I would encourage her to get an MRI of the lumbar spine to evaluate further but at the present time she politely defers this.

## 2023-01-03 LAB — LIPID PANEL
Cholesterol: 191 mg/dL (ref <200)
HDL: 67 mg/dL (ref >=50)
LDL Cholesterol (Calc): 101 mg/dL (calc) — ABNORMAL HIGH
Non-HDL Cholesterol (Calc): 124 mg/dL (calc) (ref <130)
Total CHOL/HDL Ratio: 2.9 (calc) (ref <5.0)
Triglycerides: 124 mg/dL (ref <150)

## 2023-03-07 ENCOUNTER — Other Ambulatory Visit: Payer: Self-pay | Admitting: Family Medicine

## 2023-03-07 NOTE — Telephone Encounter (Signed)
Due to a system glitch the office visits are not shown.   Looking in her chart she had her annual physical 01/02/2023 by Dr. Dennard Schaumann so all medications renewed for a year.  Requested Prescriptions  Pending Prescriptions Disp Refills   simvastatin (ZOCOR) 20 MG tablet [Pharmacy Med Name: SIMVASTATIN 20 MG TABLET] 90 tablet 3    Sig: TAKE 1 TABLET BY MOUTH DAILY AT 6 PM.     Cardiovascular:  Antilipid - Statins Failed - 03/07/2023  2:15 AM      Failed - Valid encounter within last 12 months    Recent Outpatient Visits           1 year ago Benign essential HTN   [place], Cammie Mcgee, MD   1 year ago Pure hypercholesterolemia   [place] Schaumann, Cammie Mcgee, MD   2 years ago Close exposure to COVID-19 virus   [place] Pickard, Cammie Mcgee, MD   2 years ago Encounter for screening mammogram for malignant neoplasm of breast   [place] [name], MD   5 years ago Essential hypertension   [place], Mary B, PA-C              Failed - Lipid Panel in normal range within the last 12 months    Cholesterol  Date Value Ref Range Status  01/02/2023 191 <200 mg/dL Final   LDL Cholesterol (Calc)  Date Value Ref Range Status  01/02/2023 101 (H) mg/dL (calc) Final    Comment:    Reference range: <100 . Desirable range <100 mg/dL for primary prevention;   <70 mg/dL for patients with CHD or diabetic patients  with > or = 2 CHD risk factors. Marland Kitchen LDL-C is now calculated using the Martin-Hopkins  calculation, which is a validated novel method providing  better accuracy than the Friedewald equation in the  estimation of LDL-C.  Cresenciano Genre et al. Annamaria Helling. [id number]): 2061-2068  (http://education.[facility].com/faq/FAQ164)    HDL  Date Value Ref Range Status  01/02/2023 67 > OR = 50 mg/dL Final   Triglycerides  Date Value Ref Range Status  01/02/2023 124 <150 mg/dL Final          Passed - Patient is not pregnant       triamterene-hydrochlorothiazide (MAXZIDE-25) 37.5-25 MG tablet [Pharmacy Med Name: TRIAMTERENE-HCTZ 37.5-25 MG TB] 90 tablet 3    Sig: TAKE 1 TABLET BY MOUTH EVERY DAY     Cardiovascular: Diuretic Combos Failed - 03/07/2023  2:15 AM      Failed - Valid encounter within last 6 months    Recent Outpatient Visits           1 year ago Benign essential HTN   [place] Pickard, Cammie Mcgee, MD   1 year ago Pure hypercholesterolemia   [place] [name], MD   2 years ago Close exposure to COVID-19 virus   [place] Pickard, Cammie Mcgee, MD   2 years ago Encounter for screening mammogram for malignant neoplasm of breast   [place] Schaumann, Cammie Mcgee, MD   5 years ago Essential hypertension   [place], Mary B, PA-C              Passed - K in normal range and within 180 days    Potassium  Date Value Ref Range Status  12/21/2022 3.9 3.5 -  5.1 mmol/L Final         Passed - Na in normal range and within 180 days    Sodium  Date Value Ref Range Status  12/21/2022 140 135 - 145 mmol/L Final         Passed - Cr in normal range and within 180 days    Creatinine  Date Value Ref Range Status  12/21/2022 0.74 0.44 - 1.00 mg/dL Final   Creat  Date Value Ref Range Status  12/19/2021 0.73 0.60 - 1.00 mg/dL Final         Passed - Last BP in normal range    BP Readings from Last 1 Encounters:  01/02/23 132/76

## 2023-05-03 ENCOUNTER — Ambulatory Visit
Admission: RE | Admit: 2023-05-03 | Discharge: 2023-05-03 | Disposition: A | Discharge status: Home / Self Care | Payer: Medicare PPO | Source: Ambulatory Visit | Attending: Nurse Practitioner | Admitting: Nurse Practitioner

## 2023-05-03 DIAGNOSIS — C50911 Malignant neoplasm of unspecified site of right female breast: Secondary | ICD-10-CM

## 2023-05-03 DIAGNOSIS — Z853 Personal history of malignant neoplasm of breast: Secondary | ICD-10-CM | POA: Diagnosis not present

## 2023-05-03 DIAGNOSIS — R92333 Mammographic heterogeneous density, bilateral breasts: Secondary | ICD-10-CM | POA: Diagnosis not present

## 2023-05-03 HISTORY — DX: Malignant neoplasm of unspecified site of unspecified female breast: C50.919

## 2023-06-20 ENCOUNTER — Other Ambulatory Visit: Payer: Self-pay

## 2023-06-20 DIAGNOSIS — C50911 Malignant neoplasm of unspecified site of right female breast: Secondary | ICD-10-CM

## 2023-06-21 ENCOUNTER — Encounter: Payer: Self-pay | Admitting: Nurse Practitioner

## 2023-06-21 ENCOUNTER — Inpatient Hospital Stay (HOSPITAL_BASED_OUTPATIENT_CLINIC_OR_DEPARTMENT_OTHER): Payer: Medicare PPO | Admitting: Nurse Practitioner

## 2023-06-21 ENCOUNTER — Other Ambulatory Visit: Payer: Self-pay

## 2023-06-21 ENCOUNTER — Inpatient Hospital Stay: Payer: Medicare PPO | Attending: Hematology

## 2023-06-21 VITALS — BP 126/73 | HR 70 | Temp 97.9°F | Resp 18 | Wt 181.3 lb

## 2023-06-21 DIAGNOSIS — Z79811 Long term (current) use of aromatase inhibitors: Secondary | ICD-10-CM | POA: Diagnosis not present

## 2023-06-21 DIAGNOSIS — G4700 Insomnia, unspecified: Secondary | ICD-10-CM | POA: Insufficient documentation

## 2023-06-21 DIAGNOSIS — Z170 Estrogen receptor positive status [ER+]: Secondary | ICD-10-CM | POA: Insufficient documentation

## 2023-06-21 DIAGNOSIS — C50911 Malignant neoplasm of unspecified site of right female breast: Secondary | ICD-10-CM

## 2023-06-21 DIAGNOSIS — M1990 Unspecified osteoarthritis, unspecified site: Secondary | ICD-10-CM | POA: Diagnosis not present

## 2023-06-21 DIAGNOSIS — C50111 Malignant neoplasm of central portion of right female breast: Secondary | ICD-10-CM | POA: Diagnosis not present

## 2023-06-21 LAB — CBC WITH DIFFERENTIAL (CANCER CENTER ONLY)
Abs Immature Granulocytes: 0.03 K/uL (ref 0.00–0.07)
Basophils Absolute: 0.1 K/uL (ref 0.0–0.1)
Basophils Relative: 1 %
Eosinophils Absolute: 0.3 K/uL (ref 0.0–0.5)
Eosinophils Relative: 4 %
HCT: 39.1 % (ref 36.0–46.0)
Hemoglobin: 13.2 g/dL (ref 12.0–15.0)
Immature Granulocytes: 0 %
Lymphocytes Relative: 28 %
Lymphs Abs: 2.3 K/uL (ref 0.7–4.0)
MCH: 28.6 pg (ref 26.0–34.0)
MCHC: 33.8 g/dL (ref 30.0–36.0)
MCV: 84.6 fL (ref 80.0–100.0)
Monocytes Absolute: 0.4 K/uL (ref 0.1–1.0)
Monocytes Relative: 5 %
Neutro Abs: 5.1 K/uL (ref 1.7–7.7)
Neutrophils Relative %: 62 %
Platelet Count: 198 K/uL (ref 150–400)
RBC: 4.62 MIL/uL (ref 3.87–5.11)
RDW: 13.2 % (ref 11.5–15.5)
WBC Count: 8.2 K/uL (ref 4.0–10.5)
nRBC: 0 % (ref 0.0–0.2)

## 2023-06-21 LAB — CMP (CANCER CENTER ONLY)
ALT: 16 U/L (ref 0–44)
AST: 19 U/L (ref 15–41)
Albumin: 4.3 g/dL (ref 3.5–5.0)
Alkaline Phosphatase: 71 U/L (ref 38–126)
Anion gap: 7 (ref 5–15)
BUN: 20 mg/dL (ref 8–23)
CO2: 30 mmol/L (ref 22–32)
Calcium: 10 mg/dL (ref 8.9–10.3)
Chloride: 104 mmol/L (ref 98–111)
Creatinine: 0.74 mg/dL (ref 0.44–1.00)
GFR, Estimated: >60 mL/min (ref >60)
Glucose, Bld: 93 mg/dL (ref 70–99)
Potassium: 3.7 mmol/L (ref 3.5–5.1)
Sodium: 141 mmol/L (ref 135–145)
Total Bilirubin: 1.2 mg/dL (ref 0.3–1.2)
Total Protein: 7.3 g/dL (ref 6.5–8.1)

## 2023-06-21 MED ORDER — EXEMESTANE 25 MG PO TABS: 25.0000 mg | ORAL_TABLET | Freq: Every day | ORAL | 3 refills | Status: DC

## 2023-06-21 NOTE — Progress Notes (Signed)
Patient Care Team: Donita Brooks, MD as PCP - General (Family Medicine) Donnelly Angelica, RN as Oncology Nurse Navigator Pershing Proud, RN as Oncology Nurse Navigator Harriette Bouillon, MD as Consulting Physician (General Surgery) Malachy Mood, MD as Consulting Physician (Hematology) Antony Blackbird, MD as Consulting Physician (Radiation Oncology) Pollyann Samples, NP as Nurse Practitioner (Nurse Practitioner)   CHIEF COMPLAINT: Follow up right breast cancer   Oncology History Overview Note  Cancer Staging malignant neoplasm right central breast Staging form: Breast, AJCC 8th Edition - Clinical stage from 05/04/2020: Stage IA (cT1, cN0, cM0, G2, ER+, PR+, HER2-) - Signed by Pollyann Samples, NP on 05/24/2020 - Pathologic stage from 06/08/2020: Stage IA (pT1c, pN0, cM0, G2, ER+, PR+, HER2-, Oncotype DX score: 24) - Signed by Malachy Mood, MD on 08/22/2020    malignant neoplasm right central breast  04/27/2020 Breast US   FINDINGS: Mammogram: Spot compression tomosynthesis views of the right breast were performed. There is persistence of architectural distortion in the lower central to slightly outer right breast. The area of distortion spans approximately 0.9 cm. Ultrasound: Targeted ultrasound is performed in the right breast at 6 o'clock 1 cm from the nipple demonstrating an irregular hypoechoic mass measuring 0.8 x 0.6 x 0.6 cm. The surrounding tissues are distorted. No internal blood flow identified. Targeted ultrasound of the right axilla demonstrates normal-appearing lymph nodes.   IMPRESSION: Right breast mass at 6 o'clock measuring 0.8 cm is suspicious and likely corresponds to the distortion seen mammographically.   05/04/2020 Cancer Staging   Staging form: Breast, AJCC 8th Edition - Clinical stage from 05/04/2020: Stage IA (cT1, cN0, cM0, G2, ER+, PR+, HER2-) - Signed by Pollyann Samples, NP on 05/24/2020   05/04/2020 Initial Biopsy   FINAL MICROSCOPIC DIAGNOSIS:  A.  BREAST, RIGHT/6:00, BIOPSY:  - Invasive ductal carcinoma.  - Ductal carcinoma in situ.  - Complex sclerosing lesion. COMMENT:  The carcinoma appears grade 2.  The greatest linear extent of tumor in any one core is 7 mm.   Immunohistochemical and morphometric analysis performed manually  The tumor cells are NEGATIVE for Her2 (1+).  Estrogen Receptor:       POSITIVE, 90% STRONG STAINING  Progesterone Receptor:   POSITIVE, 90% STRONG STAINING  Proliferation Marker Ki-67:   10%    05/06/2020 Initial Diagnosis   malignant neoplasm right central breast   06/02/2020 Genetic Testing   Negative genetic testing:  No pathogenic variants detected on the [facility] Breast Cancer STAT panel + Common Hereditary Cancers Panel. A variant of uncertain significance (VUS) was detected in the AXIN2 gene called c.1235A>C. The report date is 06/02/2020.  The Breast Cancer STAT Panel offered by [facility] includes sequencing and deletion/duplication analysis for the following 9 genes:  ATM, BRCA1, BRCA2, CDH1, CHEK2, PALB2, PTEN, STK11 and TP53. The Common Hereditary Cancers Panel offered by [facility] includes sequencing and/or deletion duplication testing of the following 48 genes: APC, ATM, AXIN2, BARD1, BMPR1A, BRCA1, BRCA2, BRIP1, CDH1, CDK4, CDKN2A (p14ARF), CDKN2A (p16INK4a), CHEK2, CTNNA1, DICER1, EPCAM (Deletion/duplication testing only), GREM1 (promoter region deletion/duplication testing only), KIT, MEN1, MLH1, MSH2, MSH3, MSH6, MUTYH, NBN, NF1, NHTL1, PALB2, PDGFRA, PMS2, POLD1, POLE, PTEN, RAD50, RAD51C, RAD51D, RNF43, SDHB, SDHC, SDHD, SMAD4, SMARCA4. STK11, TP53, TSC1, TSC2, and VHL.  The following genes were evaluated for sequence changes only: SDHA and HOXB13 c.251G>A variant only.   06/08/2020 Cancer Staging   Staging form: Breast, AJCC 8th Edition - Pathologic stage from 06/08/2020: Stage IA (pT1c, pN0,  cM0, G2, ER+, PR+, HER2-, Oncotype DX score: 24) - Signed by Malachy Mood, MD on 08/22/2020   06/08/2020 Surgery    RIGHT BREAST LUMPECTOMY WITH RADIOACTIVE SEED AND SENTINEL LYMPH NODE MAPPING with Dr Luisa Hart    06/08/2020 Pathology Results   FINAL MICROSCOPIC DIAGNOSIS:   A. BREAST, RIGHT, LUMPECTOMY:  - Invasive ductal carcinoma, grade 2, spanning 1.1 cm.  - High grade ductal carcinoma in situ with necrosis.  - Biopsy site.  - Resection margins negative for invasive carcinoma.  - In situ carcinoma is <0.1 cm to the posterior margin broadly, see  comment.  - See oncology table.   B. LYMPH NODE, RIGHT AXILLARY, SENTINEL, EXCISION:  - One of one lymph nodes negative for carcinoma (0/1).    06/08/2020 Oncotype testing   Recurrence score 24  Distant recurrence risk at 9 years with AI or Tamoxifen alone is 10% There is less than 1% benefit of adjuvant chemotherapy.    07/26/2020 - 08/25/2020 Radiation Therapy   Adjuvant Radiation with Dr Roselind Messier 07/26/20-08/25/20   09/2020 -  Anti-estrogen oral therapy   Exemestane 25mg  once daily starting in 09/2020   11/22/2020 Survivorship   SCP delivered by Santiago Glad, NP       CURRENT THERAPY: Exemestane, starting 09/2020  INTERVAL HISTORY [name] returns for follow up as scheduled. Last seen by me 12/21/22. Mammogram 05/03/23 was negative. She continues exemestane, tolerating well without significant side effects.  She has mild bilateral joint aches in her arms but remains functional.  Denies breast concerns such as new lump/mass, nipple discharge or inversion, or skin change.  ROS  All other systems reviewed and negative  Past Medical History:  Diagnosis Date   Arthritis    Breast cancer (HCC)    Family history of bone cancer    Family history of colon cancer    Family history of leukemia    Family history of lung cancer    Family history of melanoma    Family history of ovarian cancer    Family history of stomach cancer    Family history of uterine cancer    History of kidney stones    Hyperlipemia    Hypertension    Invasive ductal  carcinoma of breast (HCC)    right    radiation only   Lower extremity edema    Obesity    PAC (premature atrial contraction)      Past Surgical History:  Procedure Laterality Date   BREAST BIOPSY Left    BREAST LUMPECTOMY WITH RADIOACTIVE SEED AND SENTINEL LYMPH NODE BIOPSY Right 06/08/2020   Procedure: RIGHT BREAST LUMPECTOMY WITH RADIOACTIVE SEED AND SENTINEL LYMPH NODE MAPPING;  Surgeon: Harriette Bouillon, MD;  Location: [facility] OR;  Service: General;  Laterality: Right;  PEC BLOCK   CHOLECYSTECTOMY     COLONOSCOPY  01/01/2012   Procedure: COLONOSCOPY;  Surgeon: Corbin Ade, MD;  Location: [facility];  Service: Endoscopy;  Laterality: N/A;  10:00 AM   COLONOSCOPY WITH PROPOFOL N/A 10/07/2020   Procedure: COLONOSCOPY WITH PROPOFOL;  Surgeon: Corbin Ade, MD;  Location: [facility];  Service: Endoscopy;  Laterality: N/A;  9:45am   JOINT REPLACEMENT  2006   left knee   POLYPECTOMY  10/07/2020   Procedure: POLYPECTOMY;  Surgeon: Corbin Ade, MD;  Location: [facility];  Service: Endoscopy;;   TOTAL KNEE ARTHROPLASTY Right 01/13/2022   Procedure: TOTAL KNEE ARTHROPLASTY;  Surgeon: Joen Laura, MD;  Location: [facility];  Service:  Orthopedics;  Laterality: Right;   US ECHOCARDIOGRAPHY  08/21/2011   LA mild to mod. dilated,mild MR,TR     Outpatient Encounter Medications as of 06/21/2023  Medication Sig   cholecalciferol (VITAMIN D) 25 MCG (1000 UNIT) tablet Take 2,000 Units by mouth daily.   simvastatin (ZOCOR) 20 MG tablet TAKE 1 TABLET BY MOUTH DAILY AT 6 PM.   triamterene-hydrochlorothiazide (MAXZIDE-25) 37.5-25 MG tablet TAKE 1 TABLET BY MOUTH EVERY DAY   [DISCONTINUED] exemestane (AROMASIN) 25 MG tablet TAKE 1 TABLET (25 MG TOTAL) BY MOUTH DAILY AFTER BREAKFAST.   exemestane (AROMASIN) 25 MG tablet Take 1 tablet (25 mg total) by mouth daily after breakfast.   No facility-administered encounter medications on file as of 06/21/2023.     Today's Vitals   06/21/23 1052   BP: 126/73  Pulse: 70  Resp: 18  Temp: 97.9 F (36.6 C)  SpO2: 98%  Weight: 181 lb 4.8 oz (82.2 kg)   Body mass index is 34.26 kg/m.   PHYSICAL EXAM GENERAL:alert, no distress and comfortable SKIN: no rash  EYES: sclera clear NECK: without mass LYMPH:  no palpable cervical or supraclavicular lymphadenopathy  LUNGS:  normal breathing effort ABDOMEN: abdomen soft, non-tender and normal bowel sounds NEURO: alert & oriented x 3 with fluent speech, no focal motor/sensory deficits Breast exam: No nipple discharge or inversion.  S/p right lumpectomy, incisions completely healed.  No palpable mass or nodularity in either breast or axilla that I could appreciate.   CBC    Component Value Date/Time   WBC 8.2 06/21/2023 1030   WBC 11.0 (H) 01/14/2022 0308   RBC 4.62 06/21/2023 1030   HGB 13.2 06/21/2023 1030   HCT 39.1 06/21/2023 1030   PLT 198 06/21/2023 1030   MCV 84.6 06/21/2023 1030   MCH 28.6 06/21/2023 1030   MCHC 33.8 06/21/2023 1030   RDW 13.2 06/21/2023 1030   LYMPHSABS 2.3 06/21/2023 1030   MONOABS 0.4 06/21/2023 1030   EOSABS 0.3 06/21/2023 1030   BASOSABS 0.1 06/21/2023 1030     CMP     Component Value Date/Time   NA 141 06/21/2023 1030   K 3.7 06/21/2023 1030   CL 104 06/21/2023 1030   CO2 30 06/21/2023 1030   GLUCOSE 93 06/21/2023 1030   BUN 20 06/21/2023 1030   CREATININE 0.74 06/21/2023 1030   CREATININE 0.73 12/19/2021 1159   CALCIUM 10.0 06/21/2023 1030   PROT 7.3 06/21/2023 1030   ALBUMIN 4.3 06/21/2023 1030   AST 19 06/21/2023 1030   ALT 16 06/21/2023 1030   ALKPHOS 71 06/21/2023 1030   BILITOT 1.2 06/21/2023 1030   GFRNONAA >60 06/21/2023 1030   GFRNONAA 65 03/10/2021 1122   GFRAA 76 03/10/2021 1122     ASSESSMENT & PLAN:[name] is a 72 y.o. female with    1. Invasive ductal carcinoma and DCIS of the central right breast, ER+/PR+/HER2-, Grade 2, pT1cN0M0, Oncotype RS 24 -Diagnosed in 04/2020 with grade II invasive ductal carcinoma  and DCIS of right breast. -S/p right breast lumpectomy and SLNB with Dr Luisa Hart on 06/08/20. Her RS was 24.  Adjuvant chemotherapy was not recommended, she completed adjuvant Radiation. -She began antiestrogen therapy with Exemestane in 09/2020, tolerating well overall.  -Ms. Beretta is clinically doing well.  Breast exam is benign, labs are unremarkable.  Most recent mammogram 04/2023 was negative, breast density category C.  We discussed the role of additional screening MRI in certain patients, she declined for now -Continue breast cancer surveillance  and exemestane, refilled -Follow-up in 6 months, or sooner if needed   2.  Hyperbilirubinemia, RLQ and flank pain -She has had mild hyperbilirubinemia since 2018, highest 1.8 in 06/2022 -For about 1 year she has had intermittent right lower quadrant and flank pain.  She does not use Tylenol or alcohol.   -Only available abdominal imaging is a CT from 12/11/2011 that shows suspected hepatic steatosis, 2 nonobstructing right renal calculi, and s/p cholecystectomy -Abdominal ultrasound 06/29/2022 showed mildly increased echogenicity which may resent hepatic steatosis, no lesions or concern for malignancy  -T. bili 1.5 in 12/2022, normal today   3. Genetic Testing negative for pathogenetic mutations.   4. Bone Health -Normal DEXA is in 06/2017, 09/07/20, most recently 09/08/2022 which I reviewed today -She is taking vitamin D, not taking calcium due to intermittent hypercalcemia -Continue monitoring -Repeat 2025    5. Arthritis, insomnia  -She has chronic arthritis in her knees, hips and back. -Stable    PLAN: -Recent mammogram and today's labs reviewed -Continue breast cancer surveillance and exemestane, refilled -Follow-up in 6 months, or sooner if needed    All questions were answered. The patient knows to call the clinic with any problems, questions or concerns. No barriers to learning were detected.   Santiago Glad, NP-C 06/21/2023

## 2023-07-30 DIAGNOSIS — L570 Actinic keratosis: Secondary | ICD-10-CM | POA: Diagnosis not present

## 2023-07-30 DIAGNOSIS — D2271 Melanocytic nevi of right lower limb, including hip: Secondary | ICD-10-CM | POA: Diagnosis not present

## 2023-07-30 DIAGNOSIS — D485 Neoplasm of uncertain behavior of skin: Secondary | ICD-10-CM | POA: Diagnosis not present

## 2023-07-30 DIAGNOSIS — Z1283 Encounter for screening for malignant neoplasm of skin: Secondary | ICD-10-CM | POA: Diagnosis not present

## 2023-07-30 DIAGNOSIS — D225 Melanocytic nevi of trunk: Secondary | ICD-10-CM | POA: Diagnosis not present

## 2023-07-30 DIAGNOSIS — X32XXXD Exposure to sunlight, subsequent encounter: Secondary | ICD-10-CM | POA: Diagnosis not present

## 2023-08-12 ENCOUNTER — Ambulatory Visit
Admission: EM | Admit: 2023-08-12 | Discharge: 2023-08-12 | Disposition: A | Discharge status: Home / Self Care | Payer: Medicare PPO | Attending: Nurse Practitioner | Admitting: Nurse Practitioner

## 2023-08-12 DIAGNOSIS — Z1152 Encounter for screening for COVID-19: Secondary | ICD-10-CM | POA: Insufficient documentation

## 2023-08-12 DIAGNOSIS — B349 Viral infection, unspecified: Secondary | ICD-10-CM | POA: Diagnosis not present

## 2023-08-12 LAB — POCT INFLUENZA A/B
Influenza A, POC: NEGATIVE
Influenza B, POC: NEGATIVE

## 2023-08-12 MED ORDER — ONDANSETRON 4 MG PO TBDP: 4.0000 mg | ORAL_TABLET | Freq: Three times a day (TID) | ORAL | 0 refills | Status: DC | PRN

## 2023-08-12 NOTE — Discharge Instructions (Signed)
The influenza test was negative.  A COVID test is pending.  You will be contacted if the COVID test is positive.  As discussed, if you receive the results via MyChart, and have not been contacted, please call this office to request start of antiviral treatment. Take medication as prescribed. Continue over-the-counter Tylenol as needed for pain, fever, or general discomfort. Increase fluids and allow for plenty of rest.  Try to drink at least 8-10 8 ounce glasses of water while symptoms persist. Recommend a brat diet while nausea and vomiting persist.  This includes bananas, rice, applesauce, and toast. As discussed, if the test results are negative, and symptoms are not improving over the next 5 to 7 days, or if they are worsening, please follow-up in this clinic or with her primary care physician for further evaluation. Follow-up as needed.

## 2023-08-12 NOTE — ED Triage Notes (Signed)
Pt reports she has some N/V, headache and fever x 3 days   Took tylenol and ibuprofen

## 2023-08-12 NOTE — ED Provider Notes (Signed)
[facility]    CSN: [id number] Arrival date & time: 08/12/23  0865      History   Chief Complaint Chief Complaint  Patient presents with   Fever    HPI [name] is a 72 y.o. female.   The history is provided by the patient and a relative (Daughter).   The patient presents for complaints of fever, chills, fatigue, loss of taste, headache, and nausea and vomiting.  Symptoms started 3 days ago.  Tmax 102.4.  Patient has experienced 2 episodes of nausea and vomiting, last episode was this morning.  She denies ear pain, ear drainage, sore throat, nasal congestion, runny nose, cough, abdominal pain, diarrhea, or constipation.  Patient states that she has been taking Tylenol and ibuprofen for her symptoms.  Denies any obvious known sick contacts. Past Medical History:  Diagnosis Date   Arthritis    Breast cancer (HCC)    Family history of bone cancer    Family history of colon cancer    Family history of leukemia    Family history of lung cancer    Family history of melanoma    Family history of ovarian cancer    Family history of stomach cancer    Family history of uterine cancer    History of kidney stones    Hyperlipemia    Hypertension    Invasive ductal carcinoma of breast (HCC)    right    radiation only   Lower extremity edema    Obesity    PAC (premature atrial contraction)     Patient Active Problem List   Diagnosis Date Noted   S/P total knee arthroplasty, right 01/13/2022   Genetic testing 06/04/2020   Family history of ovarian cancer    Family history of colon cancer    Family history of melanoma    Family history of bone cancer    Family history of uterine cancer    Family history of stomach cancer    Family history of leukemia    Family history of lung cancer    malignant neoplasm right central breast    Vitamin D deficiency 06/20/2017   Hyperlipidemia 07/17/2013   HTN (hypertension) 07/17/2013   PAC (premature atrial contractions)  07/17/2013    Past Surgical History:  Procedure Laterality Date   BREAST BIOPSY Left    BREAST LUMPECTOMY WITH RADIOACTIVE SEED AND SENTINEL LYMPH NODE BIOPSY Right 06/08/2020   Procedure: RIGHT BREAST LUMPECTOMY WITH RADIOACTIVE SEED AND SENTINEL LYMPH NODE MAPPING;  Surgeon: Harriette Bouillon, MD;  Location: [facility];  Service: General;  Laterality: Right;  PEC BLOCK   CHOLECYSTECTOMY     COLONOSCOPY  01/01/2012   Procedure: COLONOSCOPY;  Surgeon: Corbin Ade, MD;  Location: [facility];  Service: Endoscopy;  Laterality: N/A;  10:00 AM   COLONOSCOPY WITH PROPOFOL N/A 10/07/2020   Procedure: COLONOSCOPY WITH PROPOFOL;  Surgeon: Corbin Ade, MD;  Location: [facility];  Service: Endoscopy;  Laterality: N/A;  9:45am   JOINT REPLACEMENT  2006   left knee   POLYPECTOMY  10/07/2020   Procedure: POLYPECTOMY;  Surgeon: Corbin Ade, MD;  Location: [facility];  Service: Endoscopy;;   TOTAL KNEE ARTHROPLASTY Right 01/13/2022   Procedure: TOTAL KNEE ARTHROPLASTY;  Surgeon: Joen Laura, MD;  Location: [facility];  Service: Orthopedics;  Laterality: Right;   US ECHOCARDIOGRAPHY  08/21/2011   LA mild to mod. dilated,mild MR,TR    OB History   No obstetric  history on file.      Home Medications    Prior to Admission medications   Medication Sig Start Date End Date Taking? Authorizing Provider  ondansetron (ZOFRAN-ODT) 4 MG disintegrating tablet Take 1 tablet (4 mg total) by mouth every 8 (eight) hours as needed. 08/12/23  Yes Koryn Charlot-Warren, Sadie Haber, NP  cholecalciferol (VITAMIN D) 25 MCG (1000 UNIT) tablet Take 2,000 Units by mouth daily.    [provider]  exemestane (AROMASIN) 25 MG tablet Take 1 tablet (25 mg total) by mouth daily after breakfast. 06/21/23   Pollyann Samples, NP  simvastatin (ZOCOR) 20 MG tablet TAKE 1 TABLET BY MOUTH DAILY AT 6 PM. 03/07/23   Donita Brooks, MD  triamterene-hydrochlorothiazide (MAXZIDE-25) 37.5-25 MG tablet TAKE 1 TABLET BY MOUTH  EVERY DAY 03/07/23   Donita Brooks, MD    Family History Family History  Problem Relation Age of Onset   Hypertension Mother    Colon cancer Mother 8   Hypertension Father    Heart attack Father    Melanoma Father        dx. in his early 36s (x3)   Uterine cancer Maternal Aunt        dx. >50   Cancer Daughter 79       ovarian (dysgerminoma)   Leukemia Paternal Uncle        dx. in his late 67s   Stomach cancer Maternal Grandmother        dx. in her 29s   Bone cancer Maternal Aunt        dx. in her early 19s   Lung cancer Maternal Aunt    Testicular cancer Cousin        dx. in his late 30s/early 79s (maternal cousin)   Anesthesia problems Neg Hx     Social History Social History   Tobacco Use   Smoking status: Never   Smokeless tobacco: Never  Vaping Use   Vaping status: Never Used  Substance Use Topics   Alcohol use: No   Drug use: No     Allergies   Patient has no known allergies.   Review of Systems Review of Systems Per HPI  Physical Exam Triage Vital Signs ED Triage Vitals  Encounter Vitals Group     BP 08/12/23 1001 129/79     Systolic BP Percentile --      Diastolic BP Percentile --      Pulse Rate 08/12/23 1001 90     Resp 08/12/23 1001 20     Temp 08/12/23 1001 100 F (37.8 C)     Temp Source 08/12/23 1001 Oral     SpO2 08/12/23 1001 94 %     Weight --      Height --      Head Circumference --      Peak Flow --      Pain Score 08/12/23 1002 0     Pain Loc --      Pain Education --      Exclude from Growth Chart --    No data found.  Updated Vital Signs BP 129/79 (BP Location: Right Arm)   Pulse 90   Temp 100 F (37.8 C) (Oral)   Resp 20   SpO2 94%   Visual Acuity Right Eye Distance:   Left Eye Distance:   Bilateral Distance:    Right Eye Near:   Left Eye Near:    Bilateral Near:     Physical Exam Vitals  and nursing note reviewed.  Constitutional:      General: She is not in acute distress.    Appearance: Normal  appearance.  HENT:     Head: Normocephalic.     Right Ear: Tympanic membrane, ear canal and external ear normal.     Left Ear: Tympanic membrane, ear canal and external ear normal.     Nose: Nose normal.     Mouth/Throat:     Lips: Pink.     Mouth: Mucous membranes are moist.     Pharynx: Oropharynx is clear. Uvula midline. No pharyngeal swelling, oropharyngeal exudate, posterior oropharyngeal erythema, uvula swelling or postnasal drip.  Eyes:     Extraocular Movements: Extraocular movements intact.     Conjunctiva/sclera: Conjunctivae normal.     Pupils: Pupils are equal, round, and reactive to light.  Cardiovascular:     Rate and Rhythm: Normal rate and regular rhythm.     Pulses: Normal pulses.     Heart sounds: Normal heart sounds.  Pulmonary:     Effort: Pulmonary effort is normal. No respiratory distress.     Breath sounds: Normal breath sounds. No stridor. No wheezing, rhonchi or rales.  Abdominal:     General: Bowel sounds are normal.     Palpations: Abdomen is soft.     Tenderness: There is no abdominal tenderness.  Musculoskeletal:     Cervical back: Normal range of motion.  Lymphadenopathy:     Cervical: No cervical adenopathy.  Skin:    General: Skin is warm and dry.  Neurological:     General: No focal deficit present.     Mental Status: She is alert and oriented to person, place, and time.  Psychiatric:        Mood and Affect: Mood normal.        Behavior: Behavior normal.      UC Treatments / Results  Labs (all labs ordered are listed, but only abnormal results are displayed) Labs Reviewed  SARS CORONAVIRUS 2 (TAT 6-24 HRS)  POCT INFLUENZA A/B    EKG   Radiology No results found.  Procedures Procedures (including critical care time)  Medications Ordered in UC Medications - No data to display  Initial Impression / Assessment and Plan / UC Course  I have reviewed the triage vital signs and the nursing notes.  Pertinent labs & imaging  results that were available during my care of the patient were reviewed by me and considered in my medical decision making (see chart for details).  The patient is well-appearing, she is in no acute distress, vital signs are stable.  Influenza test was negative, COVID test is pending.  Patient is a candidate to receive Paxlovid if her COVID test is positive (patient will need to hold simvastatin for 2 weeks).  Will provide symptomatic treatment for the patient's nausea and vomiting with Zofran 4 mg ODT.  Supportive care recommendations were provided and discussed with the patient to include increasing fluids, allowing for plenty of rest, a brat diet until nausea and vomiting improved, and over-the-counter analgesics for pain or discomfort.  Discussed with patient and her daughter that if all tests were negative, and symptoms worsen or do not improve over the next 5 to 7 days, it is recommended that the patient follow-up in this clinic or with her primary care physician for further evaluation.  Patient's daughter and patient are in agreement with this plan of care and verbalized understanding.  All questions were answered.  Patient stable for  discharge.   Final Clinical Impressions(s) / UC Diagnoses   Final diagnoses:  Viral illness  Encounter for screening for COVID-19     Discharge Instructions      The influenza test was negative.  A COVID test is pending.  You will be contacted if the COVID test is positive.  As discussed, if you receive the results via MyChart, and have not been contacted, please call this office to request start of antiviral treatment. Take medication as prescribed. Continue over-the-counter Tylenol as needed for pain, fever, or general discomfort. Increase fluids and allow for plenty of rest.  Try to drink at least 8-10 8 ounce glasses of water while symptoms persist. Recommend a brat diet while nausea and vomiting persist.  This includes bananas, rice, applesauce, and  toast. As discussed, if the test results are negative, and symptoms are not improving over the next 5 to 7 days, or if they are worsening, please follow-up in this clinic or with her primary care physician for further evaluation. Follow-up as needed.     ED Prescriptions     Medication Sig Dispense Auth. Provider   ondansetron (ZOFRAN-ODT) 4 MG disintegrating tablet Take 1 tablet (4 mg total) by mouth every 8 (eight) hours as needed. 20 tablet Izzie Geers-Warren, Sadie Haber, NP      PDMP not reviewed this encounter.   Abran Cantor, NP 08/12/23 1040

## 2023-08-13 LAB — SARS CORONAVIRUS 2 (TAT 6-24 HRS): SARS Coronavirus 2: NEGATIVE

## 2023-11-01 ENCOUNTER — Encounter: Payer: Self-pay | Admitting: Podiatry

## 2023-11-01 ENCOUNTER — Ambulatory Visit (INDEPENDENT_AMBULATORY_CARE_PROVIDER_SITE_OTHER): Payer: Medicare PPO

## 2023-11-01 ENCOUNTER — Ambulatory Visit: Payer: Medicare PPO | Admitting: Podiatry

## 2023-11-01 DIAGNOSIS — M19072 Primary osteoarthritis, left ankle and foot: Secondary | ICD-10-CM

## 2023-11-01 DIAGNOSIS — M19071 Primary osteoarthritis, right ankle and foot: Secondary | ICD-10-CM | POA: Diagnosis not present

## 2023-11-01 DIAGNOSIS — I872 Venous insufficiency (chronic) (peripheral): Secondary | ICD-10-CM | POA: Diagnosis not present

## 2023-11-01 DIAGNOSIS — M19079 Primary osteoarthritis, unspecified ankle and foot: Secondary | ICD-10-CM

## 2023-11-01 NOTE — Progress Notes (Signed)
Subjective:  Patient ID: [name], female    DOB: Jan 06, 1951,  MRN: [id number] HPI Chief Complaint  Patient presents with   Foot Pain    Dorsal midfoot/toes bilateral (L>R) - swelling, burning, toes are numbness, tightness on left that runs from little toe up the side of the leg, says swelling is much better first thing in the morning, no treatment   New Patient (Initial Visit)    72 y.o. female presents with the above complaint.   ROS: Denies fever chills nausea vomit muscle aches pains calf pain back pain chest pain shortness of breath.  Past Medical History:  Diagnosis Date   Arthritis    Breast cancer (HCC)    Family history of bone cancer    Family history of colon cancer    Family history of leukemia    Family history of lung cancer    Family history of melanoma    Family history of ovarian cancer    Family history of stomach cancer    Family history of uterine cancer    History of kidney stones    Hyperlipemia    Hypertension    Invasive ductal carcinoma of breast (HCC)    right    radiation only   Lower extremity edema    Obesity    PAC (premature atrial contraction)    Past Surgical History:  Procedure Laterality Date   BREAST BIOPSY Left    BREAST LUMPECTOMY WITH RADIOACTIVE SEED AND SENTINEL LYMPH NODE BIOPSY Right 06/08/2020   Procedure: RIGHT BREAST LUMPECTOMY WITH RADIOACTIVE SEED AND SENTINEL LYMPH NODE MAPPING;  Surgeon: Harriette Bouillon, MD;  Location: [facility];  Service: General;  Laterality: Right;  PEC BLOCK   CHOLECYSTECTOMY     COLONOSCOPY  01/01/2012   Procedure: COLONOSCOPY;  Surgeon: Corbin Ade, MD;  Location: [facility];  Service: Endoscopy;  Laterality: N/A;  10:00 AM   COLONOSCOPY WITH PROPOFOL N/A 10/07/2020   Procedure: COLONOSCOPY WITH PROPOFOL;  Surgeon: Corbin Ade, MD;  Location: [facility];  Service: Endoscopy;  Laterality: N/A;  9:45am   JOINT REPLACEMENT  2006   left knee   POLYPECTOMY  10/07/2020   Procedure:  POLYPECTOMY;  Surgeon: Corbin Ade, MD;  Location: [facility];  Service: Endoscopy;;   TOTAL KNEE ARTHROPLASTY Right 01/13/2022   Procedure: TOTAL KNEE ARTHROPLASTY;  Surgeon: Joen Laura, MD;  Location: [facility];  Service: Orthopedics;  Laterality: Right;   US ECHOCARDIOGRAPHY  08/21/2011   LA mild to mod. dilated,mild MR,TR    Current Outpatient Medications:    cholecalciferol (VITAMIN D) 25 MCG (1000 UNIT) tablet, Take 2,000 Units by mouth daily., Disp: , Rfl:    exemestane (AROMASIN) 25 MG tablet, Take 1 tablet (25 mg total) by mouth daily after breakfast., Disp: 90 tablet, Rfl: 3   ondansetron (ZOFRAN-ODT) 4 MG disintegrating tablet, Take 1 tablet (4 mg total) by mouth every 8 (eight) hours as needed., Disp: 20 tablet, Rfl: 0   simvastatin (ZOCOR) 20 MG tablet, TAKE 1 TABLET BY MOUTH DAILY AT 6 PM., Disp: 90 tablet, Rfl: 3   triamterene-hydrochlorothiazide (MAXZIDE-25) 37.5-25 MG tablet, TAKE 1 TABLET BY MOUTH EVERY DAY, Disp: 90 tablet, Rfl: 3  No Known Allergies Review of Systems Objective:  There were no vitals filed for this visit.  General: Well developed, nourished, in no acute distress, alert and oriented x3   Dermatological: Skin is warm, dry and supple bilateral. Nails x 10 are well maintained; remaining integument  appears unremarkable at this time. There are no open sores, no preulcerative lesions, no rash or signs of infection present.  Vascular: Dorsalis Pedis artery and Posterior Tibial artery pedal pulses are 2/4 bilateral with immedate capillary fill time. Pedal hair growth present.  Multiple varicosities bilateral left greater than right pitting edema left lower ankle.  Neruologic: Grossly intact via light touch bilateral. Vibratory intact via tuning fork bilateral. Protective threshold with Semmes Wienstein monofilament intact to all pedal sites bilateral. Patellar and Achilles deep tendon reflexes 2+ bilateral. No Babinski or clonus noted bilateral.    Musculoskeletal: No gross boney pedal deformities bilateral. No pain, crepitus, or limitation noted with foot and ankle range of motion bilateral. Muscular strength 5/5 in all groups tested bilateral.  Severe osteoarthritis with joint mass right defect to the dorsal aspect of the foot lipoma anterolateral ankle area  Gait: Unassisted, Nonantalgic.    Radiographs:  Radiographs taken today bilateral foot demonstrate severe osteoarthritis of the midfoot talonavicular joint and through the midfoot on the navicular joint but there is still some partial metatarsal joints.  In  Assessment & Plan:   Assessment: Severe osteoarthritis with venous insufficiency left of the right  Plan: Will schedule her for vascular evaluation for venous insufficiency.     Teyanna Thielman T. [place], [place]

## 2023-11-11 DIAGNOSIS — N201 Calculus of ureter: Secondary | ICD-10-CM | POA: Diagnosis not present

## 2023-11-13 ENCOUNTER — Telehealth: Payer: Self-pay

## 2023-11-13 NOTE — Telephone Encounter (Signed)
Patient called and left a message. She has not heard from vascular and is calling to let you know No order in chart - brief mention of vascular referral in last office note -please advise thanks

## 2023-11-14 ENCOUNTER — Other Ambulatory Visit: Payer: Self-pay

## 2023-11-14 DIAGNOSIS — I872 Venous insufficiency (chronic) (peripheral): Secondary | ICD-10-CM

## 2023-11-14 DIAGNOSIS — M19079 Primary osteoarthritis, unspecified ankle and foot: Secondary | ICD-10-CM

## 2023-11-16 ENCOUNTER — Other Ambulatory Visit: Payer: Self-pay | Admitting: *Deleted

## 2023-11-16 DIAGNOSIS — I872 Venous insufficiency (chronic) (peripheral): Secondary | ICD-10-CM

## 2023-11-19 ENCOUNTER — Ambulatory Visit (HOSPITAL_COMMUNITY)
Admission: RE | Admit: 2023-11-19 | Discharge: 2023-11-19 | Disposition: A | Discharge status: Home / Self Care | Payer: Medicare PPO | Source: Ambulatory Visit | Attending: Surgery | Admitting: Surgery

## 2023-11-19 DIAGNOSIS — I872 Venous insufficiency (chronic) (peripheral): Secondary | ICD-10-CM | POA: Diagnosis not present

## 2023-11-28 DIAGNOSIS — I4891 Unspecified atrial fibrillation: Secondary | ICD-10-CM | POA: Diagnosis not present

## 2023-11-28 DIAGNOSIS — M1990 Unspecified osteoarthritis, unspecified site: Secondary | ICD-10-CM | POA: Diagnosis not present

## 2023-11-28 DIAGNOSIS — Z809 Family history of malignant neoplasm, unspecified: Secondary | ICD-10-CM | POA: Diagnosis not present

## 2023-11-28 DIAGNOSIS — Z8249 Family history of ischemic heart disease and other diseases of the circulatory system: Secondary | ICD-10-CM | POA: Diagnosis not present

## 2023-11-28 DIAGNOSIS — D84821 Immunodeficiency due to drugs: Secondary | ICD-10-CM | POA: Diagnosis not present

## 2023-11-28 DIAGNOSIS — I10 Essential (primary) hypertension: Secondary | ICD-10-CM | POA: Diagnosis not present

## 2023-11-28 DIAGNOSIS — E669 Obesity, unspecified: Secondary | ICD-10-CM | POA: Diagnosis not present

## 2023-11-28 DIAGNOSIS — C50919 Malignant neoplasm of unspecified site of unspecified female breast: Secondary | ICD-10-CM | POA: Diagnosis not present

## 2023-11-28 DIAGNOSIS — E785 Hyperlipidemia, unspecified: Secondary | ICD-10-CM | POA: Diagnosis not present

## 2023-11-29 ENCOUNTER — Ambulatory Visit: Payer: Medicare PPO | Admitting: *Deleted

## 2023-11-29 DIAGNOSIS — Z0000 Encounter for general adult medical examination without abnormal findings: Secondary | ICD-10-CM

## 2023-11-29 NOTE — Progress Notes (Signed)
Subjective:   [name] is a 72 y.o. female who presents for Medicare Annual (Subsequent) preventive examination.  Visit Complete: Virtual I connected with  Theodora Blow on 11/29/23 by a audio enabled telemedicine application and verified that I am speaking with the correct person using two identifiers.  Patient Location: Home  Provider Location: Home Office  I discussed the limitations of evaluation and management by telemedicine. The patient expressed understanding and agreed to proceed.  Vital Signs: Because this visit was a virtual/telehealth visit, some criteria may be missing or patient reported. Any vitals not documented were not able to be obtained and vitals that have been documented are patient reported.  Patient Medicare AWV questionnaire was completed by the patient on 11-25-2023; I have confirmed that all information answered by patient is correct and no changes since this date.  Cardiac Risk Factors include: advanced age (>52men, >35 women);hypertension     Objective:    There were no vitals filed for this visit. There is no height or weight on file to calculate BMI.     11/29/2023    9:33 AM 11/23/2022   10:00 AM 01/13/2022    5:15 PM 01/02/2022   10:35 AM 06/10/2021   11:27 AM 10/07/2020    8:56 AM 06/24/2020   12:45 PM  Advanced Directives  Does Patient Have a Medical Advance Directive? No No No No No No No  Would patient like information on creating a medical advance directive? No - Patient declined Yes (MAU/Ambulatory/Procedural Areas - Information given) No - Patient declined No - Patient declined No - Patient declined No - Patient declined No - Patient declined    Current Medications (verified) Outpatient Encounter Medications as of 11/29/2023  Medication Sig   cholecalciferol (VITAMIN D) 25 MCG (1000 UNIT) tablet Take 2,000 Units by mouth daily.   exemestane (AROMASIN) 25 MG tablet Take 1 tablet (25 mg total) by mouth daily after breakfast.    ondansetron (ZOFRAN-ODT) 4 MG disintegrating tablet Take 1 tablet (4 mg total) by mouth every 8 (eight) hours as needed.   simvastatin (ZOCOR) 20 MG tablet TAKE 1 TABLET BY MOUTH DAILY AT 6 PM.   triamterene-hydrochlorothiazide (MAXZIDE-25) 37.5-25 MG tablet TAKE 1 TABLET BY MOUTH EVERY DAY   No facility-administered encounter medications on file as of 11/29/2023.    Allergies (verified) Patient has no known allergies.   History: Past Medical History:  Diagnosis Date   Arthritis    Breast cancer (HCC)    Family history of bone cancer    Family history of colon cancer    Family history of leukemia    Family history of lung cancer    Family history of melanoma    Family history of ovarian cancer    Family history of stomach cancer    Family history of uterine cancer    History of kidney stones    Hyperlipemia    Hypertension    Invasive ductal carcinoma of breast (HCC)    right    radiation only   Lower extremity edema    Obesity    PAC (premature atrial contraction)    Past Surgical History:  Procedure Laterality Date   BREAST BIOPSY Left    BREAST LUMPECTOMY WITH RADIOACTIVE SEED AND SENTINEL LYMPH NODE BIOPSY Right 06/08/2020   Procedure: RIGHT BREAST LUMPECTOMY WITH RADIOACTIVE SEED AND SENTINEL LYMPH NODE MAPPING;  Surgeon: Harriette Bouillon, MD;  Location: [facility];  Service: General;  Laterality: Right;  PEC BLOCK  CHOLECYSTECTOMY     COLONOSCOPY  01/01/2012   Procedure: COLONOSCOPY;  Surgeon: Corbin Ade, MD;  Location: [facility];  Service: Endoscopy;  Laterality: N/A;  10:00 AM   COLONOSCOPY WITH PROPOFOL N/A 10/07/2020   Procedure: COLONOSCOPY WITH PROPOFOL;  Surgeon: Corbin Ade, MD;  Location: [facility];  Service: Endoscopy;  Laterality: N/A;  9:45am   JOINT REPLACEMENT  2006   left knee   POLYPECTOMY  10/07/2020   Procedure: POLYPECTOMY;  Surgeon: Corbin Ade, MD;  Location: [facility];  Service: Endoscopy;;   TOTAL KNEE ARTHROPLASTY Right  01/13/2022   Procedure: TOTAL KNEE ARTHROPLASTY;  Surgeon: Joen Laura, MD;  Location: [facility];  Service: Orthopedics;  Laterality: Right;   US ECHOCARDIOGRAPHY  08/21/2011   LA mild to mod. dilated,mild MR,TR   Family History  Problem Relation Age of Onset   Hypertension Mother    Colon cancer Mother 9   Hypertension Father    Heart attack Father    Melanoma Father        dx. in his early 53s (x3)   Uterine cancer Maternal Aunt        dx. >50   Cancer Daughter 10       ovarian (dysgerminoma)   Leukemia Paternal Uncle        dx. in his late 45s   Stomach cancer Maternal Grandmother        dx. in her 57s   Bone cancer Maternal Aunt        dx. in her early 51s   Lung cancer Maternal Aunt    Testicular cancer Cousin        dx. in his late 30s/early 81s (maternal cousin)   Anesthesia problems Neg Hx    Social History   Socioeconomic History   Marital status: Married    Spouse name: Not on file   Number of children: 2   Years of education: Not on file   Highest education level: Not on file  Occupational History   Not on file  Tobacco Use   Smoking status: Never   Smokeless tobacco: Never  Vaping Use   Vaping status: Never Used  Substance and Sexual Activity   Alcohol use: No   Drug use: No   Sexual activity: Not Currently  Other Topics Concern   Not on file  Social History Narrative   Not on file   Social Drivers of Health   Financial Resource Strain: Low Risk  (11/29/2023)   Overall Financial Resource Strain (CARDIA)    Difficulty of Paying Living Expenses: Not hard at all  Food Insecurity: No Food Insecurity (11/29/2023)   Hunger Vital Sign    Worried About Running Out of Food in the Last Year: Never true    Ran Out of Food in the Last Year: Never true  Transportation Needs: No Transportation Needs (11/29/2023)   PRAPARE - Administrator, Civil Service (Medical): No    Lack of Transportation (Non-Medical): No  Physical Activity:  Insufficiently Active (11/29/2023)   Exercise Vital Sign    Days of Exercise per Week: 3 days    Minutes of Exercise per Session: 20 min  Stress: No Stress Concern Present (11/29/2023)   [employer] of Occupational Health - Occupational Stress Questionnaire    Feeling of Stress : Not at all  Social Connections: Unknown (11/29/2023)   Social Connection and Isolation Panel [NHANES]    Frequency of Communication with Friends and  Family: Once a week    Frequency of Social Gatherings with Friends and Family: Patient declined    Attends Religious Services: Never    Database administrator or Organizations: No    Attends Engineer, structural: Never    Marital Status: Married    Tobacco Counseling Counseling given: Not Answered   Clinical Intake:  Pre-visit preparation completed: Yes  Pain : No/denies pain     Diabetes: No  How often do you need to have someone help you when you read instructions, pamphlets, or other written materials from your doctor or pharmacy?: 1 - Never  Interpreter Needed?: No  Information entered by :: Remi Haggard LPN   Activities of Daily Living    11/29/2023    9:44 AM 11/25/2023    8:47 AM  In your present state of health, do you have any difficulty performing the following activities:  Hearing? 0 0  Vision? 0 0  Difficulty concentrating or making decisions? 0 0  Walking or climbing stairs? 0 0  Dressing or bathing? 0 0  Doing errands, shopping? 0 0  Preparing Food and eating ? N N  Using the Toilet? N N  In the past six months, have you accidently leaked urine? Y Y  Do you have problems with loss of bowel control? N N  Managing your Medications? N N  Managing your Finances? N N  Housekeeping or managing your Housekeeping? N N    Patient Care Team: Donita Brooks, MD as PCP - General (Family Medicine) Donnelly Angelica, RN as Oncology Nurse Navigator Pershing Proud, RN as Oncology Nurse Navigator Harriette Bouillon, MD as  Consulting Physician (General Surgery) Malachy Mood, MD as Consulting Physician (Hematology) Antony Blackbird, MD as Consulting Physician (Radiation Oncology) Pollyann Samples, NP as Nurse Practitioner (Nurse Practitioner)  Indicate any recent Medical Services you may have received from other than [facility] providers in the past year (date may be approximate).     Assessment:   This is a routine wellness examination for Knierim.  Hearing/Vision screen Hearing Screening - Comments:: No trouble hearing Vision Screening - Comments:: Not up to date    Goals Addressed             This Visit's Progress    Weight (lb) < 200 lb (90.7 kg)         Depression Screen    11/29/2023    9:47 AM 11/29/2023    9:38 AM 01/02/2023    8:25 AM 11/23/2022    9:47 AM 06/10/2021   11:29 AM 04/02/2020    3:03 PM 12/20/2017    4:18 PM  PHQ 2/9 Scores  PHQ - 2 Score 0 0 0 0 0 0 0  PHQ- 9 Score 0          Fall Risk    11/29/2023    9:34 AM 11/25/2023    8:47 AM 01/02/2023    8:25 AM 11/23/2022    9:58 AM 11/19/2022    8:43 AM  Fall Risk   Falls in the past year? 0 0 0 0 0  Number falls in past yr: 0 0 0 0   Injury with Fall? 0  0 0   Risk for fall due to :   No Fall Risks No Fall Risks   Follow up Falls evaluation completed;Education provided;Falls prevention discussed  Falls prevention discussed Falls evaluation completed;Education provided;Falls prevention discussed     MEDICARE RISK AT HOME: Medicare Risk at  Home Any stairs in or around the home?: (Patient-Rptd) (P) Yes If so, are there any without handrails?: (Patient-Rptd) (P) No Home free of loose throw rugs in walkways, pet beds, electrical cords, etc?: (Patient-Rptd) (P) No Adequate lighting in your home to reduce risk of falls?: (Patient-Rptd) (P) Yes Life alert?: (Patient-Rptd) (P) No Use of a cane, walker or w/c?: (Patient-Rptd) (P) No Grab bars in the bathroom?: (Patient-Rptd) (P) No Shower chair or bench in shower?: (Patient-Rptd) (P)  No Elevated toilet seat or a handicapped toilet?: (Patient-Rptd) (P) No  TIMED UP AND GO:  Was the test performed?  No    Cognitive Function:        11/29/2023    9:33 AM 11/23/2022   10:00 AM  6CIT Screen  What Year? 0 points 0 points  What month? 0 points 0 points  What time? 0 points 0 points  Count back from 20 0 points 0 points  Months in reverse 0 points 0 points  Repeat phrase 0 points 0 points  Total Score 0 points 0 points    Immunizations Immunization History  Administered Date(s) Administered   Pneumococcal Conjugate-13 06/11/2017   Pneumococcal Polysaccharide-23 04/02/2020   Tdap 06/11/2017    TDAP status: Up to date  Flu Vaccine status: Declined, Education has been provided regarding the importance of this vaccine but patient still declined. Advised may receive this vaccine at local pharmacy or Health Dept. Aware to provide a copy of the vaccination record if obtained from local pharmacy or Health Dept. Verbalized acceptance and understanding.  Pneumococcal vaccine status: Up to date  Covid-19 vaccine status: Declined, Education has been provided regarding the importance of this vaccine but patient still declined. Advised may receive this vaccine at local pharmacy or Health Dept.or vaccine clinic. Aware to provide a copy of the vaccination record if obtained from local pharmacy or Health Dept. Verbalized acceptance and understanding.  Qualifies for Shingles Vaccine? Yes   Zostavax completed No   Shingrix Completed?: No.    Education has been provided regarding the importance of this vaccine. Patient has been advised to call insurance company to determine out of pocket expense if they have not yet received this vaccine. Advised may also receive vaccine at local pharmacy or Health Dept. Verbalized acceptance and understanding.  Screening Tests Health Maintenance  Topic Date Due   COVID-19 Vaccine (1) 12/15/2023 (Originally 09/18/1956)   Zoster Vaccines-  Shingrix (1 of 2) 02/27/2024 (Originally 09/18/1970)   INFLUENZA VACCINE  03/10/2024 (Originally 07/12/2023)   Hepatitis C Screening  11/28/2024 (Originally 09/18/1969)   MAMMOGRAM  05/02/2024   Medicare Annual Wellness (AWV)  11/28/2024   DTaP/Tdap/Td (2 - Td or Tdap) 06/12/2027   Colonoscopy  10/07/2030   Pneumonia Vaccine 69+ Years old  Completed   DEXA SCAN  Completed   HPV VACCINES  Aged Out    Health Maintenance  There are no preventive care reminders to display for this patient.   Colorectal cancer screening: Type of screening: Colonoscopy. Completed 2021. Repeat every 5 years  Mammogram status: Completed  . Repeat every year  Bone Density status: Completed 2018. Results reflect: Bone density results: NORMAL. Repeat every 0 years.  Lung Cancer Screening: (Low Dose CT Chest recommended if Age 66-80 years, 20 pack-year currently smoking OR have quit w/in 15years.) does not qualify.   Lung Cancer Screening Referral:   Additional Screening:  Hepatitis C Screening: never done  Vision Screening: Recommended annual ophthalmology exams for early detection of glaucoma and other disorders  of the eye. Is the patient up to date with their annual eye exam?  No  Who is the provider or what is the name of the office in which the patient attends annual eye exams?  If pt is not established with a provider, would they like to be referred to a provider to establish care? No .   Dental Screening: Recommended annual dental exams for proper oral hygiene    Community Resource Referral / Chronic Care Management: CRR required this visit?  No   CCM required this visit?  No     Plan:     I have personally reviewed and noted the following in the patient's chart:   Medical and social history Use of alcohol, tobacco or illicit drugs  Current medications and supplements including opioid prescriptions. Patient is not currently taking opioid prescriptions. Functional ability and  status Nutritional status Physical activity Advanced directives List of other physicians Hospitalizations, surgeries, and ER visits in previous 12 months Vitals Screenings to include cognitive, depression, and falls Referrals and appointments  In addition, I have reviewed and discussed with patient certain preventive protocols, quality metrics, and best practice recommendations. A written personalized care plan for preventive services as well as general preventive health recommendations were provided to patient.     Remi Haggard, LPN   [id number]   After Visit Summary: (MyChart) Due to this being a telephonic visit, the after visit summary with patients personalized plan was offered to patient via MyChart   Nurse Notes:

## 2023-11-29 NOTE — Patient Instructions (Signed)
[name] , Thank you for taking time to come for your Medicare Wellness Visit. I appreciate your ongoing commitment to your health goals. Please review the following plan we discussed and let me know if I can assist you in the future.   Screening recommendations/referrals: Colonoscopy: up to date Mammogram: up to date Bone Density: up to date Recommended yearly ophthalmology/optometry visit for glaucoma screening and checkup Recommended yearly dental visit for hygiene and checkup  Vaccinations: Influenza vaccine:  Pneumococcal vaccine: up to date Tdap vaccine: up to date Shingles vaccine:     Advanced directives: Education provided     Preventive Care 65 Years and Older, Female Preventive care refers to lifestyle choices and visits with your health care provider that can promote health and wellness. What does preventive care include? A yearly physical exam. This is also called an annual well check. Dental exams once or twice a year. Routine eye exams. Ask your health care provider how often you should have your eyes checked. Personal lifestyle choices, including: Daily care of your teeth and gums. Regular physical activity. Eating a healthy diet. Avoiding tobacco and drug use. Limiting alcohol use. Practicing safe sex. Taking low-dose aspirin every day. Taking vitamin and mineral supplements as recommended by your health care provider. What happens during an annual well check? The services and screenings done by your health care provider during your annual well check will depend on your age, overall health, lifestyle risk factors, and family history of disease. Counseling  Your health care provider may ask you questions about your: Alcohol use. Tobacco use. Drug use. Emotional well-being. Home and relationship well-being. Sexual activity. Eating habits. History of falls. Memory and ability to understand (cognition). Work and work Astronomer. Reproductive  health. Screening  You may have the following tests or measurements: Height, weight, and BMI. Blood pressure. Lipid and cholesterol levels. These may be checked every 5 years, or more frequently if you are over 26 years old. Skin check. Lung cancer screening. You may have this screening every year starting at age 56 if you have a 30-pack-year history of smoking and currently smoke or have quit within the past 15 years. Fecal occult blood test (FOBT) of the stool. You may have this test every year starting at age 1. Flexible sigmoidoscopy or colonoscopy. You may have a sigmoidoscopy every 5 years or a colonoscopy every 10 years starting at age 66. Hepatitis C blood test. Hepatitis B blood test. Sexually transmitted disease (STD) testing. Diabetes screening. This is done by checking your blood sugar (glucose) after you have not eaten for a while (fasting). You may have this done every 1-3 years. Bone density scan. This is done to screen for osteoporosis. You may have this done starting at age 78. Mammogram. This may be done every 1-2 years. Talk to your health care provider about how often you should have regular mammograms. Talk with your health care provider about your test results, treatment options, and if necessary, the need for more tests. Vaccines  Your health care provider may recommend certain vaccines, such as: Influenza vaccine. This is recommended every year. Tetanus, diphtheria, and acellular pertussis (Tdap, Td) vaccine. You may need a Td booster every 10 years. Zoster vaccine. You may need this after age 36. Pneumococcal 13-valent conjugate (PCV13) vaccine. One dose is recommended after age 28. Pneumococcal polysaccharide (PPSV23) vaccine. One dose is recommended after age 46. Talk to your health care provider about which screenings and vaccines you need and how often you need  them. This information is not intended to replace advice given to you by your health care provider.  Make sure you discuss any questions you have with your health care provider. Document Released: 12/24/2015 Document Revised: 08/16/2016 Document Reviewed: 09/28/2015 Elsevier Interactive Patient Education  2017 [employer].  Fall Prevention in the Home Falls can cause injuries. They can happen to people of all ages. There are many things you can do to make your home safe and to help prevent falls. What can I do on the outside of my home? Regularly fix the edges of walkways and driveways and fix any cracks. Remove anything that might make you trip as you walk through a door, such as a raised step or threshold. Trim any bushes or trees on the path to your home. Use bright outdoor lighting. Clear any walking paths of anything that might make someone trip, such as rocks or tools. Regularly check to see if handrails are loose or broken. Make sure that both sides of any steps have handrails. Any raised decks and porches should have guardrails on the edges. Have any leaves, snow, or ice cleared regularly. Use sand or salt on walking paths during winter. Clean up any spills in your garage right away. This includes oil or grease spills. What can I do in the bathroom? Use night lights. Install grab bars by the toilet and in the tub and shower. Do not use towel bars as grab bars. Use non-skid mats or decals in the tub or shower. If you need to sit down in the shower, use a plastic, non-slip stool. Keep the floor dry. Clean up any water that spills on the floor as soon as it happens. Remove soap buildup in the tub or shower regularly. Attach bath mats securely with double-sided non-slip rug tape. Do not have throw rugs and other things on the floor that can make you trip. What can I do in the bedroom? Use night lights. Make sure that you have a light by your bed that is easy to reach. Do not use any sheets or blankets that are too big for your bed. They should not hang down onto the floor. Have a  firm chair that has side arms. You can use this for support while you get dressed. Do not have throw rugs and other things on the floor that can make you trip. What can I do in the kitchen? Clean up any spills right away. Avoid walking on wet floors. Keep items that you use a lot in easy-to-reach places. If you need to reach something above you, use a strong step stool that has a grab bar. Keep electrical cords out of the way. Do not use floor polish or wax that makes floors slippery. If you must use wax, use non-skid floor wax. Do not have throw rugs and other things on the floor that can make you trip. What can I do with my stairs? Do not leave any items on the stairs. Make sure that there are handrails on both sides of the stairs and use them. Fix handrails that are broken or loose. Make sure that handrails are as long as the stairways. Check any carpeting to make sure that it is firmly attached to the stairs. Fix any carpet that is loose or worn. Avoid having throw rugs at the top or bottom of the stairs. If you do have throw rugs, attach them to the floor with carpet tape. Make sure that you have a light switch at  the top of the stairs and the bottom of the stairs. If you do not have them, ask someone to add them for you. What else can I do to help prevent falls? Wear shoes that: Do not have high heels. Have rubber bottoms. Are comfortable and fit you well. Are closed at the toe. Do not wear sandals. If you use a stepladder: Make sure that it is fully opened. Do not climb a closed stepladder. Make sure that both sides of the stepladder are locked into place. Ask someone to hold it for you, if possible. Clearly mark and make sure that you can see: Any grab bars or handrails. First and last steps. Where the edge of each step is. Use tools that help you move around (mobility aids) if they are needed. These include: Canes. Walkers. Scooters. Crutches. Turn on the lights when you  go into a dark area. Replace any light bulbs as soon as they burn out. Set up your furniture so you have a clear path. Avoid moving your furniture around. If any of your floors are uneven, fix them. If there are any pets around you, be aware of where they are. Review your medicines with your doctor. Some medicines can make you feel dizzy. This can increase your chance of falling. Ask your doctor what other things that you can do to help prevent falls. This information is not intended to replace advice given to you by your health care provider. Make sure you discuss any questions you have with your health care provider. Document Released: 09/23/2009 Document Revised: 05/04/2016 Document Reviewed: 01/01/2015 Elsevier Interactive Patient Education  2017 [employer].

## 2023-12-10 ENCOUNTER — Encounter: Payer: Self-pay | Admitting: Physician Assistant

## 2023-12-10 ENCOUNTER — Ambulatory Visit: Payer: Medicare PPO | Admitting: Physician Assistant

## 2023-12-10 VITALS — BP 144/89 | HR 83 | Temp 97.9°F | Wt 182.0 lb

## 2023-12-10 DIAGNOSIS — I781 Nevus, non-neoplastic: Secondary | ICD-10-CM

## 2023-12-10 DIAGNOSIS — I872 Venous insufficiency (chronic) (peripheral): Secondary | ICD-10-CM | POA: Diagnosis not present

## 2023-12-10 DIAGNOSIS — Z1283 Encounter for screening for malignant neoplasm of skin: Secondary | ICD-10-CM | POA: Diagnosis not present

## 2023-12-10 DIAGNOSIS — L570 Actinic keratosis: Secondary | ICD-10-CM | POA: Diagnosis not present

## 2023-12-10 DIAGNOSIS — D2271 Melanocytic nevi of right lower limb, including hip: Secondary | ICD-10-CM | POA: Diagnosis not present

## 2023-12-10 DIAGNOSIS — R200 Anesthesia of skin: Secondary | ICD-10-CM

## 2023-12-10 DIAGNOSIS — X32XXXD Exposure to sunlight, subsequent encounter: Secondary | ICD-10-CM | POA: Diagnosis not present

## 2023-12-10 DIAGNOSIS — D225 Melanocytic nevi of trunk: Secondary | ICD-10-CM | POA: Diagnosis not present

## 2023-12-10 DIAGNOSIS — D485 Neoplasm of uncertain behavior of skin: Secondary | ICD-10-CM | POA: Diagnosis not present

## 2023-12-10 NOTE — Progress Notes (Signed)
Office Note     CC:  follow up Requesting Provider:  Elinor Parkinson, DPM  HPI: [name] is a 72 y.o. ([birth year]) female who presents for evaluation of bilateral lower extremity edema right worse than left.  She also complains of numbness in both feet.  She has noticed edema in both legs over the years however believes it is worsening as she gets older.  She has worn compression in the past however does not currently wear compression.  She does not elevate her legs during the day.  She denies any history of DVT, venous ulceration, trauma, or prior vascular intervention.  She denies tobacco use.  She also denies any claudication of bilateral lower extremities.   Past Medical History:  Diagnosis Date   Arthritis    Breast cancer (HCC)    Family history of bone cancer    Family history of colon cancer    Family history of leukemia    Family history of lung cancer    Family history of melanoma    Family history of ovarian cancer    Family history of stomach cancer    Family history of uterine cancer    History of kidney stones    Hyperlipemia    Hypertension    Invasive ductal carcinoma of breast (HCC)    right    radiation only   Lower extremity edema    Obesity    PAC (premature atrial contraction)     Past Surgical History:  Procedure Laterality Date   BREAST BIOPSY Left    BREAST LUMPECTOMY WITH RADIOACTIVE SEED AND SENTINEL LYMPH NODE BIOPSY Right 06/08/2020   Procedure: RIGHT BREAST LUMPECTOMY WITH RADIOACTIVE SEED AND SENTINEL LYMPH NODE MAPPING;  Surgeon: Harriette Bouillon, MD;  Location: [facility];  Service: General;  Laterality: Right;  PEC BLOCK   CHOLECYSTECTOMY     COLONOSCOPY  01/01/2012   Procedure: COLONOSCOPY;  Surgeon: Corbin Ade, MD;  Location: [facility];  Service: Endoscopy;  Laterality: N/A;  10:00 AM   COLONOSCOPY WITH PROPOFOL N/A 10/07/2020   Procedure: COLONOSCOPY WITH PROPOFOL;  Surgeon: Corbin Ade, MD;  Location: [facility];  Service:  Endoscopy;  Laterality: N/A;  9:45am   JOINT REPLACEMENT  2006   left knee   POLYPECTOMY  10/07/2020   Procedure: POLYPECTOMY;  Surgeon: Corbin Ade, MD;  Location: [facility];  Service: Endoscopy;;   TOTAL KNEE ARTHROPLASTY Right 01/13/2022   Procedure: TOTAL KNEE ARTHROPLASTY;  Surgeon: Joen Laura, MD;  Location: [facility];  Service: Orthopedics;  Laterality: Right;   US ECHOCARDIOGRAPHY  08/21/2011   LA mild to mod. dilated,mild MR,TR    Social History   Socioeconomic History   Marital status: Married    Spouse name: Not on file   Number of children: 2   Years of education: Not on file   Highest education level: Not on file  Occupational History   Not on file  Tobacco Use   Smoking status: Never   Smokeless tobacco: Never  Vaping Use   Vaping status: Never Used  Substance and Sexual Activity   Alcohol use: No   Drug use: No   Sexual activity: Not Currently  Other Topics Concern   Not on file  Social History Narrative   Not on file   Social Drivers of Health   Financial Resource Strain: Low Risk  (11/29/2023)   Overall Financial Resource Strain (CARDIA)    Difficulty of Paying Living Expenses:  Not hard at all  Food Insecurity: No Food Insecurity (11/29/2023)   Hunger Vital Sign    Worried About Running Out of Food in the Last Year: Never true    Ran Out of Food in the Last Year: Never true  Transportation Needs: No Transportation Needs (11/29/2023)   PRAPARE - Administrator, Civil Service (Medical): No    Lack of Transportation (Non-Medical): No  Physical Activity: Insufficiently Active (11/29/2023)   Exercise Vital Sign    Days of Exercise per Week: 3 days    Minutes of Exercise per Session: 20 min  Stress: No Stress Concern Present (11/29/2023)   [employer] of Occupational Health - Occupational Stress Questionnaire    Feeling of Stress : Not at all  Social Connections: Unknown (11/29/2023)   Social Connection and Isolation  Panel [NHANES]    Frequency of Communication with Friends and Family: Once a week    Frequency of Social Gatherings with Friends and Family: Patient declined    Attends Religious Services: Never    Database administrator or Organizations: No    Attends Banker Meetings: Never    Marital Status: Married  Catering manager Violence: Not At Risk (11/29/2023)   Humiliation, Afraid, Rape, and Kick questionnaire    Fear of Current or Ex-Partner: No    Emotionally Abused: No    Physically Abused: No    Sexually Abused: No    Family History  Problem Relation Age of Onset   Hypertension Mother    Colon cancer Mother 2   Hypertension Father    Heart attack Father    Melanoma Father        dx. in his early 81s (x3)   Uterine cancer Maternal Aunt        dx. >50   Cancer Daughter 4       ovarian (dysgerminoma)   Leukemia Paternal Uncle        dx. in his late 39s   Stomach cancer Maternal Grandmother        dx. in her 7s   Bone cancer Maternal Aunt        dx. in her early 91s   Lung cancer Maternal Aunt    Testicular cancer Cousin        dx. in his late 30s/early 59s (maternal cousin)   Anesthesia problems Neg Hx     Current Outpatient Medications  Medication Sig Dispense Refill   cholecalciferol (VITAMIN D) 25 MCG (1000 UNIT) tablet Take 2,000 Units by mouth daily.     exemestane (AROMASIN) 25 MG tablet Take 1 tablet (25 mg total) by mouth daily after breakfast. 90 tablet 3   ondansetron (ZOFRAN-ODT) 4 MG disintegrating tablet Take 1 tablet (4 mg total) by mouth every 8 (eight) hours as needed. 20 tablet 0   simvastatin (ZOCOR) 20 MG tablet TAKE 1 TABLET BY MOUTH DAILY AT 6 PM. 90 tablet 3   triamterene-hydrochlorothiazide (MAXZIDE-25) 37.5-25 MG tablet TAKE 1 TABLET BY MOUTH EVERY DAY 90 tablet 3   No current facility-administered medications for this visit.    No Known Allergies   REVIEW OF SYSTEMS:   [X]  denotes positive finding, [ ]  denotes negative  finding Cardiac  Comments:  Chest pain or chest pressure:    Shortness of breath upon exertion:    Short of breath when lying flat:    Irregular heart rhythm:        Vascular    Pain in calf, thigh,  or hip brought on by ambulation:    Pain in feet at night that wakes you up from your sleep:     Blood clot in your veins:    Leg swelling:         Pulmonary    Oxygen at home:    Productive cough:     Wheezing:         Neurologic    Sudden weakness in arms or legs:     Sudden numbness in arms or legs:     Sudden onset of difficulty speaking or slurred speech:    Temporary loss of vision in one eye:     Problems with dizziness:         Gastrointestinal    Blood in stool:     Vomited blood:         Genitourinary    Burning when urinating:     Blood in urine:        Psychiatric    Major depression:         Hematologic    Bleeding problems:    Problems with blood clotting too easily:        Skin    Rashes or ulcers:        Constitutional    Fever or chills:      PHYSICAL EXAMINATION:  Vitals:   12/10/23 0933  BP: (!) 144/89  Pulse: 83  Temp: 97.9 F (36.6 C)  TempSrc: Temporal  SpO2: 95%  Weight: 182 lb (82.6 kg)    General:  WDWN in NAD; vital signs documented above Gait: Not observed HENT: WNL, normocephalic Pulmonary: normal non-labored breathing , without Rales, rhonchi,  wheezing Cardiac: regular HR Abdomen: soft, NT, no masses Skin: without rashes Vascular Exam/Pulses: Palpable DP pulses bilaterally Extremities: without ischemic changes, without Gangrene , without cellulitis; without open wounds; scattered spider veins both lower legs; no ropey varicosities; no pigmentation changes Musculoskeletal: no muscle wasting or atrophy  Neurologic: A&O X 3 Psychiatric:  The pt has Normal affect.   Non-Invasive Vascular Imaging:   Right lower extremity venous reflux study negative for DVT  Incompetent GSV throughout the thigh And negative for deep  venous reflux   ASSESSMENT/PLAN:: 72 y.o. female here for evaluation of bilateral lower extremity edema and spider veins  Patient was referred to office for evaluation of bilateral lower extremity edema.  She is not bothered by the edema or her spider veins.  Right lower extremity venous reflux study was negative for DVT.  Study was also negative for deep venous reflux.  She does have an incompetent GSV throughout the thigh however given lack of symptoms there is no indication for intervention at this time.  Recommendations included knee-high 15 to 20 mmHg compression socks to wear on a daily basis.  We also discussed proper leg elevation which should be performed periodically during the day.  She will also try to avoid prolonged sitting and standing.  Patient also complained of numbness in both feet.  She has no history of low back pain or lumbar spine stenosis.  Patient also has palpable DP pulses bilaterally which rules out arterial insufficiency.  Defer further workup to her PCP.  For now the patient will follow-up on an as-needed basis.  Should she develop worsening edema we can repeat reflux study however would not repeat right lower extremity reflux study for at least a calendar year.  We also discussed sclerotherapy for spider veins however this is purely for cosmetic purposes.  She is not currently interested.   Emilie Rutter, PA-C [facility] [phone]  Clinic MD:   Hetty Blend on call

## 2023-12-11 ENCOUNTER — Observation Stay (HOSPITAL_COMMUNITY)
Admission: EM | Admit: 2023-12-11 | Discharge: 2023-12-12 | Disposition: A | Discharge status: Home / Self Care | Payer: Medicare PPO | Attending: Urology | Admitting: Urology

## 2023-12-11 ENCOUNTER — Emergency Department (HOSPITAL_COMMUNITY): Payer: Medicare PPO | Admitting: Anesthesiology

## 2023-12-11 ENCOUNTER — Emergency Department (HOSPITAL_BASED_OUTPATIENT_CLINIC_OR_DEPARTMENT_OTHER): Payer: Medicare PPO | Admitting: Anesthesiology

## 2023-12-11 ENCOUNTER — Encounter (HOSPITAL_COMMUNITY): Payer: Self-pay

## 2023-12-11 ENCOUNTER — Emergency Department (HOSPITAL_COMMUNITY): Payer: Medicare PPO

## 2023-12-11 ENCOUNTER — Encounter (HOSPITAL_COMMUNITY): Admission: EM | Disposition: A | Discharge status: Home / Self Care | Payer: Self-pay | Source: Home / Self Care

## 2023-12-11 ENCOUNTER — Other Ambulatory Visit: Payer: Self-pay

## 2023-12-11 DIAGNOSIS — N23 Unspecified renal colic: Secondary | ICD-10-CM | POA: Diagnosis not present

## 2023-12-11 DIAGNOSIS — N3000 Acute cystitis without hematuria: Secondary | ICD-10-CM

## 2023-12-11 DIAGNOSIS — R109 Unspecified abdominal pain: Secondary | ICD-10-CM

## 2023-12-11 DIAGNOSIS — N201 Calculus of ureter: Secondary | ICD-10-CM | POA: Diagnosis not present

## 2023-12-11 DIAGNOSIS — K449 Diaphragmatic hernia without obstruction or gangrene: Secondary | ICD-10-CM | POA: Diagnosis not present

## 2023-12-11 DIAGNOSIS — I10 Essential (primary) hypertension: Secondary | ICD-10-CM | POA: Insufficient documentation

## 2023-12-11 DIAGNOSIS — N132 Hydronephrosis with renal and ureteral calculous obstruction: Secondary | ICD-10-CM | POA: Diagnosis not present

## 2023-12-11 DIAGNOSIS — N390 Urinary tract infection, site not specified: Secondary | ICD-10-CM | POA: Diagnosis not present

## 2023-12-11 DIAGNOSIS — Z853 Personal history of malignant neoplasm of breast: Secondary | ICD-10-CM | POA: Diagnosis not present

## 2023-12-11 DIAGNOSIS — Z79899 Other long term (current) drug therapy: Secondary | ICD-10-CM | POA: Insufficient documentation

## 2023-12-11 DIAGNOSIS — K5750 Diverticulosis of both small and large intestine without perforation or abscess without bleeding: Secondary | ICD-10-CM | POA: Diagnosis not present

## 2023-12-11 DIAGNOSIS — R1110 Vomiting, unspecified: Secondary | ICD-10-CM | POA: Diagnosis not present

## 2023-12-11 DIAGNOSIS — I959 Hypotension, unspecified: Secondary | ICD-10-CM | POA: Diagnosis not present

## 2023-12-11 DIAGNOSIS — N3001 Acute cystitis with hematuria: Principal | ICD-10-CM | POA: Insufficient documentation

## 2023-12-11 DIAGNOSIS — Z96653 Presence of artificial knee joint, bilateral: Secondary | ICD-10-CM | POA: Insufficient documentation

## 2023-12-11 DIAGNOSIS — M549 Dorsalgia, unspecified: Secondary | ICD-10-CM | POA: Diagnosis not present

## 2023-12-11 HISTORY — PX: CYSTOSCOPY WITH RETROGRADE PYELOGRAM, URETEROSCOPY AND STENT PLACEMENT: SHX5789

## 2023-12-11 LAB — CBC WITH DIFFERENTIAL/PLATELET
Abs Immature Granulocytes: 0.04 10*3/uL (ref 0.00–0.07)
Basophils Absolute: 0.1 10*3/uL (ref 0.0–0.1)
Basophils Relative: 1 %
Eosinophils Absolute: 0.3 10*3/uL (ref 0.0–0.5)
Eosinophils Relative: 3 %
HCT: 42.4 % (ref 36.0–46.0)
Hemoglobin: 13.9 g/dL (ref 12.0–15.0)
Immature Granulocytes: 0 %
Lymphocytes Relative: 35 %
Lymphs Abs: 3.2 10*3/uL (ref 0.7–4.0)
MCH: 27.9 pg (ref 26.0–34.0)
MCHC: 32.8 g/dL (ref 30.0–36.0)
MCV: 85.1 fL (ref 80.0–100.0)
Monocytes Absolute: 0.5 10*3/uL (ref 0.1–1.0)
Monocytes Relative: 5 %
Neutro Abs: 5.1 10*3/uL (ref 1.7–7.7)
Neutrophils Relative %: 56 %
Platelets: 194 10*3/uL (ref 150–400)
RBC: 4.98 MIL/uL (ref 3.87–5.11)
RDW: 13.5 % (ref 11.5–15.5)
WBC: 9.1 10*3/uL (ref 4.0–10.5)
nRBC: 0 % (ref 0.0–0.2)

## 2023-12-11 LAB — URINALYSIS, ROUTINE W REFLEX MICROSCOPIC
Bilirubin Urine: NEGATIVE
Glucose, UA: NEGATIVE mg/dL
Ketones, ur: NEGATIVE mg/dL
Nitrite: POSITIVE — AB
Protein, ur: 100 mg/dL — AB
Specific Gravity, Urine: 1.015 (ref 1.005–1.030)
WBC, UA: >50 WBC/hpf (ref 0–5)
pH: 6 (ref 5.0–8.0)

## 2023-12-11 LAB — BASIC METABOLIC PANEL
Anion gap: 12 (ref 5–15)
BUN: 27 mg/dL — ABNORMAL HIGH (ref 8–23)
CO2: 23 mmol/L (ref 22–32)
Calcium: 9.6 mg/dL (ref 8.9–10.3)
Chloride: 104 mmol/L (ref 98–111)
Creatinine, Ser: 0.84 mg/dL (ref 0.44–1.00)
GFR, Estimated: >60 mL/min (ref >60)
Glucose, Bld: 128 mg/dL — ABNORMAL HIGH (ref 70–99)
Potassium: 3.5 mmol/L (ref 3.5–5.1)
Sodium: 139 mmol/L (ref 135–145)

## 2023-12-11 SURGERY — CYSTOURETEROSCOPY, WITH RETROGRADE PYELOGRAM AND STENT INSERTION
Anesthesia: General | Laterality: Left

## 2023-12-11 MED ORDER — DIPHENHYDRAMINE HCL 12.5 MG/5ML PO ELIX: 12.5000 mg | ORAL_SOLUTION | Freq: Four times a day (QID) | ORAL | Status: DC | PRN

## 2023-12-11 MED ORDER — ONDANSETRON HCL 4 MG/2ML IJ SOLN
INTRAMUSCULAR | Status: AC
  Filled 2023-12-11: qty 2

## 2023-12-11 MED ORDER — ONDANSETRON HCL 4 MG/2ML IJ SOLN
4.0000 mg | Freq: Once | INTRAMUSCULAR | Status: AC
  Administered 2023-12-11: 4 mg via INTRAVENOUS
  Filled 2023-12-11: qty 2

## 2023-12-11 MED ORDER — EPHEDRINE SULFATE (PRESSORS) 50 MG/ML IJ SOLN
INTRAMUSCULAR | Status: DC | PRN
  Administered 2023-12-11: 10 mg via INTRAVENOUS
  Administered 2023-12-11: 5 mg via INTRAVENOUS

## 2023-12-11 MED ORDER — PROCHLORPERAZINE EDISYLATE 10 MG/2ML IJ SOLN
5.0000 mg | Freq: Once | INTRAMUSCULAR | Status: AC
  Administered 2023-12-11: 5 mg via INTRAVENOUS
  Filled 2023-12-11: qty 2

## 2023-12-11 MED ORDER — OXYBUTYNIN CHLORIDE 5 MG PO TABS: 5.0000 mg | ORAL_TABLET | Freq: Three times a day (TID) | ORAL | Status: DC | PRN

## 2023-12-11 MED ORDER — DIPHENHYDRAMINE HCL 50 MG/ML IJ SOLN: 12.5000 mg | Freq: Four times a day (QID) | INTRAMUSCULAR | Status: DC | PRN

## 2023-12-11 MED ORDER — SODIUM CHLORIDE 0.9 % IR SOLN
Status: DC | PRN
  Administered 2023-12-11: 3000 mL via INTRAVESICAL

## 2023-12-11 MED ORDER — FENTANYL CITRATE PF 50 MCG/ML IJ SOSY: 50.0000 ug | PREFILLED_SYRINGE | Freq: Once | INTRAMUSCULAR | Status: DC

## 2023-12-11 MED ORDER — SUCCINYLCHOLINE CHLORIDE 200 MG/10ML IV SOSY
PREFILLED_SYRINGE | INTRAVENOUS | Status: AC
Start: 2023-12-11 — End: ?
  Filled 2023-12-11: qty 10

## 2023-12-11 MED ORDER — MORPHINE SULFATE (PF) 2 MG/ML IV SOLN
2.0000 mg | Freq: Once | INTRAVENOUS | Status: AC
  Administered 2023-12-11: 2 mg via INTRAVENOUS
  Filled 2023-12-11: qty 1

## 2023-12-11 MED ORDER — LIDOCAINE HCL (CARDIAC) PF 100 MG/5ML IV SOSY
PREFILLED_SYRINGE | INTRAVENOUS | Status: DC | PRN
  Administered 2023-12-11: 80 mg via INTRAVENOUS

## 2023-12-11 MED ORDER — ZOLPIDEM TARTRATE 5 MG PO TABS: 5.0000 mg | ORAL_TABLET | Freq: Every evening | ORAL | Status: DC | PRN

## 2023-12-11 MED ORDER — PROPOFOL 10 MG/ML IV BOLUS
INTRAVENOUS | Status: DC | PRN
  Administered 2023-12-11: 120 mg via INTRAVENOUS

## 2023-12-11 MED ORDER — LIDOCAINE HCL (PF) 2 % IJ SOLN
INTRAMUSCULAR | Status: AC
  Filled 2023-12-11: qty 5

## 2023-12-11 MED ORDER — SIMVASTATIN 20 MG PO TABS: 20.0000 mg | ORAL_TABLET | Freq: Every day | ORAL | Status: DC

## 2023-12-11 MED ORDER — OXYCODONE HCL 5 MG PO TABS
5.0000 mg | ORAL_TABLET | ORAL | Status: DC | PRN
  Administered 2023-12-12: 5 mg via ORAL
  Filled 2023-12-11: qty 1

## 2023-12-11 MED ORDER — ONDANSETRON 4 MG PO TBDP: 4.0000 mg | ORAL_TABLET | Freq: Three times a day (TID) | ORAL | 0 refills | Status: DC | PRN

## 2023-12-11 MED ORDER — FENTANYL CITRATE PF 50 MCG/ML IJ SOSY: 25.0000 ug | PREFILLED_SYRINGE | INTRAMUSCULAR | Status: DC | PRN

## 2023-12-11 MED ORDER — CEPHALEXIN 500 MG PO CAPS: 500.0000 mg | ORAL_CAPSULE | Freq: Three times a day (TID) | ORAL | 0 refills | Status: DC

## 2023-12-11 MED ORDER — IOHEXOL 300 MG/ML  SOLN
INTRAMUSCULAR | Status: DC | PRN
  Administered 2023-12-11: 6 mL via URETHRAL

## 2023-12-11 MED ORDER — PHENYLEPHRINE HCL (PRESSORS) 10 MG/ML IV SOLN
INTRAVENOUS | Status: AC
Start: 2023-12-11 — End: ?
  Filled 2023-12-11: qty 1

## 2023-12-11 MED ORDER — DOCUSATE SODIUM 100 MG PO CAPS
100.0000 mg | ORAL_CAPSULE | Freq: Two times a day (BID) | ORAL | Status: DC
  Administered 2023-12-11 – 2023-12-12 (×2): 100 mg via ORAL
  Filled 2023-12-11 (×2): qty 1

## 2023-12-11 MED ORDER — ACETAMINOPHEN 325 MG PO TABS
650.0000 mg | ORAL_TABLET | ORAL | Status: DC | PRN
Start: 2023-12-11 — End: 2023-12-12

## 2023-12-11 MED ORDER — ACETAMINOPHEN 10 MG/ML IV SOLN
1000.0000 mg | Freq: Once | INTRAVENOUS | Status: DC | PRN
Start: 2023-12-11 — End: 2023-12-11

## 2023-12-11 MED ORDER — ONDANSETRON HCL 4 MG/2ML IJ SOLN
4.0000 mg | INTRAMUSCULAR | Status: DC | PRN
Start: 2023-12-11 — End: 2023-12-12

## 2023-12-11 MED ORDER — SODIUM CHLORIDE 0.9 % IV SOLN
2.0000 g | INTRAVENOUS | Status: DC
  Administered 2023-12-12: 2 g via INTRAVENOUS
  Filled 2023-12-11: qty 20

## 2023-12-11 MED ORDER — SUCCINYLCHOLINE CHLORIDE 200 MG/10ML IV SOSY
PREFILLED_SYRINGE | INTRAVENOUS | Status: DC | PRN
  Administered 2023-12-11: 120 mg via INTRAVENOUS

## 2023-12-11 MED ORDER — OXYCODONE HCL 5 MG PO TABS: 5.0000 mg | ORAL_TABLET | Freq: Once | ORAL | Status: DC | PRN

## 2023-12-11 MED ORDER — ACETAMINOPHEN 160 MG/5ML PO SOLN
325.0000 mg | ORAL | Status: DC | PRN
Start: 2023-12-11 — End: 2023-12-11

## 2023-12-11 MED ORDER — SODIUM CHLORIDE 0.9 % IV SOLN
1.0000 g | Freq: Once | INTRAVENOUS | Status: AC
  Administered 2023-12-11: 1 g via INTRAVENOUS
  Filled 2023-12-11: qty 10

## 2023-12-11 MED ORDER — PHENYLEPHRINE HCL (PRESSORS) 10 MG/ML IV SOLN
INTRAVENOUS | Status: AC
  Filled 2023-12-11: qty 1

## 2023-12-11 MED ORDER — SODIUM CHLORIDE 0.9 % IV BOLUS
1000.0000 mL | Freq: Once | INTRAVENOUS | Status: AC
  Administered 2023-12-11: 1000 mL via INTRAVENOUS

## 2023-12-11 MED ORDER — PHENYLEPHRINE HCL (PRESSORS) 10 MG/ML IV SOLN
INTRAVENOUS | Status: DC | PRN
  Administered 2023-12-11 (×5): 160 ug via INTRAVENOUS

## 2023-12-11 MED ORDER — FENTANYL CITRATE PF 50 MCG/ML IJ SOSY
50.0000 ug | PREFILLED_SYRINGE | Freq: Once | INTRAMUSCULAR | Status: AC
  Administered 2023-12-11: 50 ug via INTRAVENOUS
  Filled 2023-12-11: qty 1

## 2023-12-11 MED ORDER — TRIAMTERENE-HCTZ 37.5-25 MG PO TABS
1.0000 | ORAL_TABLET | Freq: Every day | ORAL | Status: DC
  Administered 2023-12-11 – 2023-12-12 (×2): 1 via ORAL
  Filled 2023-12-11 (×2): qty 1

## 2023-12-11 MED ORDER — EXEMESTANE 25 MG PO TABS
25.0000 mg | ORAL_TABLET | Freq: Every day | ORAL | Status: DC
  Administered 2023-12-12: 25 mg via ORAL
  Filled 2023-12-11: qty 1

## 2023-12-11 MED ORDER — TRIPLE ANTIBIOTIC 3.5-400-5000 EX OINT: 1.0000 | TOPICAL_OINTMENT | Freq: Three times a day (TID) | CUTANEOUS | Status: DC | PRN

## 2023-12-11 MED ORDER — DEXTROSE-SODIUM CHLORIDE 5-0.45 % IV SOLN: INTRAVENOUS | Status: DC

## 2023-12-11 MED ORDER — CHLORHEXIDINE GLUCONATE 0.12 % MT SOLN
15.0000 mL | Freq: Once | OROMUCOSAL | Status: AC
  Administered 2023-12-11: 15 mL via OROMUCOSAL

## 2023-12-11 MED ORDER — LACTATED RINGERS IV SOLN: INTRAVENOUS | Status: DC

## 2023-12-11 MED ORDER — FENTANYL CITRATE (PF) 100 MCG/2ML IJ SOLN
INTRAMUSCULAR | Status: AC
  Filled 2023-12-11: qty 2

## 2023-12-11 MED ORDER — SENNA 8.6 MG PO TABS
1.0000 | ORAL_TABLET | Freq: Two times a day (BID) | ORAL | Status: DC
  Administered 2023-12-11 – 2023-12-12 (×2): 8.6 mg via ORAL
  Filled 2023-12-11 (×2): qty 1

## 2023-12-11 MED ORDER — MORPHINE SULFATE (PF) 2 MG/ML IV SOLN: 2.0000 mg | INTRAVENOUS | Status: DC | PRN

## 2023-12-11 MED ORDER — ONDANSETRON HCL 4 MG/2ML IJ SOLN
INTRAMUSCULAR | Status: DC | PRN
  Administered 2023-12-11: 4 mg via INTRAVENOUS

## 2023-12-11 MED ORDER — PROPOFOL 10 MG/ML IV BOLUS
INTRAVENOUS | Status: AC
Start: 2023-12-11 — End: ?
  Filled 2023-12-11: qty 20

## 2023-12-11 MED ORDER — PHENYLEPHRINE 80 MCG/ML (10ML) SYRINGE FOR IV PUSH (FOR BLOOD PRESSURE SUPPORT)
PREFILLED_SYRINGE | INTRAVENOUS | Status: AC
  Filled 2023-12-11: qty 10

## 2023-12-11 MED ORDER — OXYCODONE HCL 5 MG/5ML PO SOLN: 5.0000 mg | Freq: Once | ORAL | Status: DC | PRN

## 2023-12-11 MED ORDER — KETOROLAC TROMETHAMINE 15 MG/ML IJ SOLN
15.0000 mg | Freq: Once | INTRAMUSCULAR | Status: AC
  Administered 2023-12-11: 15 mg via INTRAVENOUS
  Filled 2023-12-11: qty 1

## 2023-12-11 MED ORDER — ACETAMINOPHEN 325 MG PO TABS: 325.0000 mg | ORAL_TABLET | ORAL | Status: DC | PRN

## 2023-12-11 MED ORDER — HYDROCODONE-ACETAMINOPHEN 5-325 MG PO TABS
1.0000 | ORAL_TABLET | Freq: Four times a day (QID) | ORAL | 0 refills | Status: DC | PRN
Start: 2023-12-11 — End: 2023-12-20

## 2023-12-11 MED ORDER — DROPERIDOL 2.5 MG/ML IJ SOLN: 0.6250 mg | Freq: Once | INTRAMUSCULAR | Status: DC | PRN

## 2023-12-11 MED ORDER — FENTANYL CITRATE (PF) 100 MCG/2ML IJ SOLN
INTRAMUSCULAR | Status: DC | PRN
  Administered 2023-12-11 (×2): 25 ug via INTRAVENOUS
  Administered 2023-12-11: 50 ug via INTRAVENOUS

## 2023-12-11 SURGICAL SUPPLY — 21 items
BAG URO CATCHER STRL LF (MISCELLANEOUS) ×1 IMPLANT
BASKET LASER NITINOL 1.9FR (BASKET) IMPLANT
BASKET ZERO TIP NITINOL 2.4FR (BASKET) IMPLANT
CATH URETERAL DUAL LUMEN 10F (MISCELLANEOUS) IMPLANT
CATH URETL OPEN END 6FR 70 (CATHETERS) ×1 IMPLANT
CLOTH BEACON ORANGE TIMEOUT ST (SAFETY) ×1 IMPLANT
EXTRACTOR STONE 1.7FRX115CM (UROLOGICAL SUPPLIES) IMPLANT
GLOVE BIO SURGEON STRL SZ7.5 (GLOVE) ×1 IMPLANT
GOWN STRL REUS W/ TWL XL LVL3 (GOWN DISPOSABLE) ×1 IMPLANT
GUIDEWIRE ANG ZIPWIRE 038X150 (WIRE) IMPLANT
GUIDEWIRE STR DUAL SENSOR (WIRE) ×1 IMPLANT
KIT TURNOVER KIT A (KITS) IMPLANT
LASER FIB FLEXIVA PULSE ID 365 (Laser) IMPLANT
MANIFOLD NEPTUNE II (INSTRUMENTS) ×1 IMPLANT
PACK CYSTO (CUSTOM PROCEDURE TRAY) ×1 IMPLANT
SHEATH NAVIGATOR HD 11/13X28 (SHEATH) IMPLANT
SHEATH NAVIGATOR HD 11/13X36 (SHEATH) IMPLANT
STENT URET 6FRX26 CONTOUR (STENTS) IMPLANT
TRACTIP FLEXIVA PULS ID 200XHI (Laser) IMPLANT
TUBING CONNECTING 10 (TUBING) ×1 IMPLANT
TUBING UROLOGY SET (TUBING) ×1 IMPLANT

## 2023-12-11 NOTE — Anesthesia Procedure Notes (Signed)
 Procedure Name: Intubation Date/Time: 12/11/2023 4:16 PM  Performed by: Vincenzo Show, CRNAPre-anesthesia Checklist: Patient identified, Emergency Drugs available, Suction available, Patient being monitored and Timeout performed Patient Re-evaluated:Patient Re-evaluated prior to induction Oxygen Delivery Method: Circle system utilized Preoxygenation: Pre-oxygenation with 100% oxygen Induction Type: IV induction Ventilation: Mask ventilation without difficulty Laryngoscope Size: Mac and 3 Grade View: Grade I Tube type: Oral Tube size: 7.0 mm Number of attempts: 1 Airway Equipment and Method: Stylet Placement Confirmation: ETT inserted through vocal cords under direct vision, positive ETCO2, CO2 detector and breath sounds checked- equal and bilateral Secured at: 21 cm Tube secured with: Tape Dental Injury: Teeth and Oropharynx as per pre-operative assessment

## 2023-12-11 NOTE — ED Provider Notes (Signed)
 Received handoff from overnight team; disposition pending antiemetics and reevaluation in the setting of 12 mm mildly obstructing stone with UTI.  Patient continues to have nausea and vomiting and pain has returned. Physical Exam  BP (!) 130/92   Pulse 100   Temp 97.8 F (36.6 C) (Oral)   Resp 13   Ht 5' 3 (1.6 m)   Wt 81.6 kg   SpO2 100%   BMI 31.89 kg/m   Physical Exam Vitals reviewed.  Constitutional:      Comments: Uncomfortable appearing  Eyes:     Conjunctiva/sclera: Conjunctivae normal.  Cardiovascular:     Rate and Rhythm: Normal rate and regular rhythm.  Pulmonary:     Effort: Pulmonary effort is normal.  Skin:    General: Skin is warm.  Neurological:     Mental Status: She is alert and oriented to person, place, and time.  Psychiatric:        Mood and Affect: Mood normal.        Behavior: Behavior normal.     Procedures  Procedures  ED Course / MDM   Clinical Course as of 12/11/23 0831  Tue Dec 11, 2023  0534 Patient actively vomiting, will give another round of Zofran  and Toradol  [DW]  0628 Patient now feeling improved.  She is in no acute distress.  Renal function is appropriate. Patient does have evidence of a UTI, IV Rocephin  has been ordered [DW]  [phone] Initial plan was to d/c with urology followup Pt is now vomiting again Will give one more round of zofran , if no improvement may need admission [DW]  0800 Spoke with Ugene bell with urology; no coverage here at [facility]. Recommending transfer to [facility] so he evaluate for possible intervention. Spoke with Dr. Haze, ED provider, who will accept patient to [facility]. Will transfer in stable condition.  [TY]    Clinical Course User Index [DW] Midge Golas, MD [TY] Neysa Caron PARAS, DO   Medical Decision Making 72 year old female present emergency department with flank pain.  Seen primarily by overnight team.  See their note for full HPI.  Unfortunately patient failed to improve despite repeat dose of Zofran .   Will transfer to [facility] for urology intervention with Dr. Carolee.  Dr. Nanine at [facility] emergency department has accepted patient.  See ED course for further MDM/decision making.  Amount and/or Complexity of Data Reviewed Labs: ordered. Radiology: ordered.  Risk Prescription drug management.         Neysa Caron PARAS, DO 12/11/23 [phone]

## 2023-12-11 NOTE — Discharge Instructions (Addendum)

## 2023-12-11 NOTE — ED Triage Notes (Signed)
 The pt was bib [facility] from [facility]. The pt was transferred here to be consulted for urology. She was given Morphine before leaving. VS: B/P 95/52, P 107, O2 Sats 95%RA.

## 2023-12-11 NOTE — Transfer of Care (Signed)
 Immediate Anesthesia Transfer of Care Note  Patient: [name]  Procedure(s) Performed: Procedure(s): CYSTOSCOPY WITH RETROGRADE PYELOGRAM, URETEROSCOPY AND STENT PLACEMENT (Left)  Patient Location: PACU  Anesthesia Type:General  Level of Consciousness:  sedated, patient cooperative and responds to stimulation  Airway & Oxygen Therapy:Patient Spontanous Breathing and Patient connected to face mask oxgen  Post-op Assessment:  Report given to PACU RN and Post -op Vital signs reviewed and stable  Post vital signs:  Reviewed and stable  Last Vitals:  Vitals:   12/11/23 1245 12/11/23 1518  BP: (!) 100/54 (!) 124/58  Pulse: (!) 106 (!) 101  Resp: (!) 24 16  Temp:  37.7 C  SpO2: 97% 94%    Complications: No apparent anesthesia complications

## 2023-12-11 NOTE — Anesthesia Preprocedure Evaluation (Addendum)
 Anesthesia Evaluation  Patient identified by MRN, date of birth, ID band Patient awake    Reviewed: Allergy & Precautions, NPO status , Patient's Chart, lab work & pertinent test results  Airway Mallampati: I  TM Distance: >3 FB Neck ROM: Full    Dental  (+) Teeth Intact, Dental Advisory Given   Pulmonary neg pulmonary ROS   breath sounds clear to auscultation       Cardiovascular hypertension, Pt. on medications  Rhythm:Regular Rate:Normal     Neuro/Psych negative neurological ROS  negative psych ROS   GI/Hepatic negative GI ROS, Neg liver ROS,,,  Endo/Other  negative endocrine ROS    Renal/GU negative Renal ROS     Musculoskeletal  (+) Arthritis ,    Abdominal   Peds  Hematology negative hematology ROS (+)   Anesthesia Other Findings   Reproductive/Obstetrics                             Anesthesia Physical Anesthesia Plan  ASA: 2  Anesthesia Plan: General   Post-op Pain Management:    Induction: Intravenous, Cricoid pressure planned and Rapid sequence  PONV Risk Score and Plan: 4 or greater and Ondansetron  and Treatment may vary due to age or medical condition  Airway Management Planned: Oral ETT  Additional Equipment: None  Intra-op Plan:   Post-operative Plan: Extubation in OR  Informed Consent: I have reviewed the patients History and Physical, chart, labs and discussed the procedure including the risks, benefits and alternatives for the proposed anesthesia with the patient or authorized representative who has indicated his/her understanding and acceptance.     Dental advisory given  Plan Discussed with: CRNA  Anesthesia Plan Comments:        Anesthesia Quick Evaluation

## 2023-12-11 NOTE — ED Notes (Signed)
Pt had two silver rings on L ring finger--pt took both rings off and gave to daughter at bedside, Marcelino Duster, to hold onto

## 2023-12-11 NOTE — ED Provider Notes (Signed)
 [place] EMERGENCY DEPARTMENT AT [facility] Provider Note   CSN: [id number] Arrival date & time: 12/11/23  0435     History  Chief Complaint  Patient presents with   Flank Pain    [name] is a 72 y.o. female.  The history is provided by the patient.  Flank Pain This is a new problem. The current episode started 1 to 2 hours ago. The problem occurs constantly. The problem has been rapidly worsening. Associated symptoms include abdominal pain. Pertinent negatives include no chest pain and no shortness of breath. Nothing relieves the symptoms.  Patient history of breast cancer presents with sudden onset of left flank pain.  Patient reports she went to bed in her usual state of health.  She woke up tonight with left flank and left lower quadrant abdominal pain with associated nausea and vomiting. Reports distant history of kidney stones, but no recent episodes.  Never required surgical intervention    Past Medical History:  Diagnosis Date   Arthritis    Breast cancer (HCC)    Family history of bone cancer    Family history of colon cancer    Family history of leukemia    Family history of lung cancer    Family history of melanoma    Family history of ovarian cancer    Family history of stomach cancer    Family history of uterine cancer    History of kidney stones    Hyperlipemia    Hypertension    Invasive ductal carcinoma of breast (HCC)    right    radiation only   Lower extremity edema    Obesity    PAC (premature atrial contraction)     Home Medications Prior to Admission medications   Medication Sig Start Date End Date Taking? Authorizing Provider  cephALEXin  (KEFLEX ) 500 MG capsule Take 1 capsule (500 mg total) by mouth 3 (three) times daily. 12/11/23  Yes Midge Golas, MD  HYDROcodone -acetaminophen  (NORCO/VICODIN) 5-325 MG tablet Take 1 tablet by mouth every 6 (six) hours as needed for severe pain (pain score 7-10). 12/11/23  Yes  Midge Golas, MD  ondansetron  (ZOFRAN -ODT) 4 MG disintegrating tablet Take 1 tablet (4 mg total) by mouth every 8 (eight) hours as needed. 12/11/23  Yes Midge Golas, MD  cholecalciferol  (VITAMIN D ) 25 MCG (1000 UNIT) tablet Take 2,000 Units by mouth daily.    [provider]  exemestane  (AROMASIN ) 25 MG tablet Take 1 tablet (25 mg total) by mouth daily after breakfast. 06/21/23   Burton, Lacie K, NP  simvastatin  (ZOCOR ) 20 MG tablet TAKE 1 TABLET BY MOUTH DAILY AT 6 PM. 03/07/23   Duanne Butler DASEN, MD  triamterene -hydrochlorothiazide  (MAXZIDE -25) 37.5-25 MG tablet TAKE 1 TABLET BY MOUTH EVERY DAY 03/07/23   Duanne Butler DASEN, MD      Allergies    Patient has no known allergies.    Review of Systems   Review of Systems  Respiratory:  Negative for shortness of breath.   Cardiovascular:  Negative for chest pain.  Gastrointestinal:  Positive for abdominal pain.  Genitourinary:  Positive for flank pain.    Physical Exam Updated Vital Signs BP 119/67   Pulse 72   Temp 97.6 F (36.4 C) (Oral)   Resp 12   Ht 1.6 m (5' 3)   Wt 81.6 kg   SpO2 99%   BMI 31.89 kg/m  Physical Exam CONSTITUTIONAL: Elderly, uncomfortable appearing HEAD: Normocephalic/atraumatic ENMT: Mucous membranes moist NECK: supple no  meningeal signs CV: S1/S2 noted, no murmurs/rubs/gallops noted LUNGS: Lungs are clear to auscultation bilaterally, no apparent distress ABDOMEN: soft, nontender, no rebound or guarding, bowel sounds noted throughout abdomen GU:no cva tenderness NEURO: Pt is awake/alert/appropriate, moves all extremitiesx4.  No facial droop.   EXTREMITIES: pulses normal/equal, full ROM SKIN: warm, color normal   ED Results / Procedures / Treatments   Labs (all labs ordered are listed, but only abnormal results are displayed) Labs Reviewed  BASIC METABOLIC PANEL - Abnormal; Notable for the following components:      Result Value   Glucose, Bld 128 (*)    BUN 27 (*)    All other  components within normal limits  URINALYSIS, ROUTINE W REFLEX MICROSCOPIC - Abnormal; Notable for the following components:   APPearance HAZY (*)    Hgb urine dipstick MODERATE (*)    Protein, ur 100 (*)    Nitrite POSITIVE (*)    Leukocytes,Ua LARGE (*)    Bacteria, UA FEW (*)    Non Squamous Epithelial 0-5 (*)    All other components within normal limits  URINE CULTURE  CBC WITH DIFFERENTIAL/PLATELET    EKG None  Radiology CT Renal Stone Study Result Date: 12/11/2023 CLINICAL DATA:  Left flank pain. Nausea vomiting. History of kidney stones. EXAM: CT ABDOMEN AND PELVIS WITHOUT CONTRAST TECHNIQUE: Multidetector CT imaging of the abdomen and pelvis was performed following the standard protocol without IV contrast. RADIATION DOSE REDUCTION: This exam was performed according to the departmental dose-optimization program which includes automated exposure control, adjustment of the mA and/or kV according to patient size and/or use of iterative reconstruction technique. COMPARISON:  12/11/2011 FINDINGS: Lower chest: No acute findings. Hepatobiliary: No suspicious focal abnormality in the liver on this study without intravenous contrast. Cholecystectomy. No intrahepatic or extrahepatic biliary dilation. Pancreas: No focal mass lesion. No dilatation of the main duct. No intraparenchymal cyst. No peripancreatic edema. Spleen: No splenomegaly. No suspicious focal mass lesion. Adrenals/Urinary Tract: No adrenal nodule or mass. 3.1 cm water  density lesion upper pole right kidney is compatible with a cyst. No followup imaging is recommended. 2 nonobstructing stones are seen in the right kidney with dominant stone measuring 7 mm in the lower pole. Right ureter unremarkable. There is mild left hydronephrosis with nonobstructing 7 mm stone in the lower pole. 12 x 7 x 14 mm stone is identified at the left UPJ. There is associated left parapelvic edema. Left ureter unremarkable. The urinary bladder appears normal  for the degree of distention. Stomach/Bowel: Tiny hiatal hernia. Stomach otherwise unremarkable. Duodenum is normally positioned as is the ligament of Treitz. Duodenal diverticulum noted. No small bowel wall thickening. No small bowel dilatation. The terminal ileum is normal. The appendix is normal. No gross colonic mass. No colonic wall thickening. Diverticuli are seen scattered along the entire length of the colon without CT findings of diverticulitis. Vascular/Lymphatic: There is mild atherosclerotic calcification of the abdominal aorta without aneurysm. There is no gastrohepatic or hepatoduodenal ligament lymphadenopathy. No retroperitoneal or mesenteric lymphadenopathy. No pelvic sidewall lymphadenopathy. Reproductive: Unremarkable. Other: No intraperitoneal free fluid. Musculoskeletal: No worrisome lytic or sclerotic osseous abnormality. IMPRESSION: 1. 12 x 7 x 14 mm stone at the left UPJ with mild left hydronephrosis and associated left parapelvic edema. 2. Additional nonobstructing bilateral renal stones. 3. Tiny hiatal hernia. 4. Colonic diverticulosis without diverticulitis. 5.  Aortic Atherosclerosis (ICD10-I70.0). Electronically Signed   By: Camellia Candle M.D.   On: 12/11/2023 06:52    Procedures Procedures  Medications Ordered in ED Medications  ondansetron  (ZOFRAN ) injection 4 mg (has no administration in time range)  sodium chloride  0.9 % bolus 1,000 mL (has no administration in time range)  ondansetron  (ZOFRAN ) injection 4 mg (4 mg Intravenous Given 12/11/23 0452)  fentaNYL  (SUBLIMAZE ) injection 50 mcg (50 mcg Intravenous Given 12/11/23 0452)  ketorolac  (TORADOL ) 15 MG/ML injection 15 mg (15 mg Intravenous Given 12/11/23 0534)  ondansetron  (ZOFRAN ) injection 4 mg (4 mg Intravenous Given 12/11/23 0534)  cefTRIAXone  (ROCEPHIN ) 1 g in sodium chloride  0.9 % 100 mL IVPB (0 g Intravenous Stopped 12/11/23 0645)    ED Course/ Medical Decision Making/ A&P Clinical Course as of 12/11/23  0701  Tue Dec 11, 2023  0534 Patient actively vomiting, will give another round of Zofran  and Toradol  [DW]  0628 Patient now feeling improved.  She is in no acute distress.  Renal function is appropriate. Patient does have evidence of a UTI, IV Rocephin  has been ordered [DW]  [phone] Initial plan was to d/c with urology followup Pt is now vomiting again Will give one more round of zofran , if no improvement may need admission [DW]    Clinical Course User Index [DW] Midge Golas, MD                                 Medical Decision Making Amount and/or Complexity of Data Reviewed Labs: ordered. Radiology: ordered.  Risk Prescription drug management.   This patient presents to the ED for concern of flank pain, this involves an extensive number of treatment options, and is a complaint that carries with it a high risk of complications and morbidity.  The differential diagnosis includes but is not limited to: Pyelonephritis, ureteral stone, muscle strain, AAA, epidural abscess, psoas abscess  Comorbidities that complicate the patient evaluation: Patient's presentation is complicated by their history of breast cancer   Additional history obtained: Records reviewed  outpatient records reviewed  Lab Tests: I Ordered, and personally interpreted labs.  The pertinent results include: UTI  Imaging Studies ordered: I ordered imaging studies including CT scan renal   I independently visualized and interpreted imaging which showed ureteral stone I agree with the radiologist interpretation  Medicines ordered and prescription drug management: I ordered medication including fentanyl  for pain Reevaluation of the patient after these medicines showed that the patient    stayed the same  Critical Interventions:   pain management, IV Rocephin   Consultations Obtained: I requested consultation with the consultant urology , and discussed  findings as well as pertinent plan - they recommend: if  pain controlled she can f/u as outpatient  Reevaluation: After the interventions noted above, I reevaluated the patient and found that they have :worsened  Complexity of problems addressed: Patient's presentation is most consistent with  acute presentation with potential threat to life or bodily function          Final Clinical Impression(s) / ED Diagnoses Final diagnoses:  Acute cystitis with hematuria  Left flank pain  Ureteral colic    Rx / DC Orders ED Discharge Orders          Ordered    cephALEXin  (KEFLEX ) 500 MG capsule  3 times daily        12/11/23 0630    ondansetron  (ZOFRAN -ODT) 4 MG disintegrating tablet  Every 8 hours PRN        12/11/23 0630    HYDROcodone -acetaminophen  (NORCO/VICODIN) 5-325 MG tablet  Every 6  hours PRN        12/11/23 0630              Midge Golas, MD 12/11/23 [phone]

## 2023-12-11 NOTE — ED Notes (Signed)
ED Provider at bedside. 

## 2023-12-11 NOTE — ED Triage Notes (Signed)
 Pt c/o left side flank pain starting about an hour ago. Pt has hx of kidney stones and states "kinda feels the same but different." Pt states nausea and vomiting this morning. Pt states one episode of continuous vomiting otw to the hospital.

## 2023-12-11 NOTE — ED Provider Notes (Signed)
  Physical Exam  BP (!) 100/54   Pulse (!) 106   Temp 98 F (36.7 C) (Oral)   Resp (!) 24   Ht 5' 3 (1.6 m)   Wt 81.6 kg   SpO2 97%   BMI 31.89 kg/m   Physical Exam  Procedures  Procedures  ED Course / MDM   Clinical Course as of 12/11/23 1423  Tue Dec 11, 2023  0534 Patient actively vomiting, will give another round of Zofran  and Toradol  [DW]  0628 Patient now feeling improved.  She is in no acute distress.  Renal function is appropriate. Patient does have evidence of a UTI, IV Rocephin  has been ordered [DW]  [phone] Initial plan was to d/c with urology followup Pt is now vomiting again Will give one more round of zofran , if no improvement may need admission [DW]  0800 Spoke with Ugene bell with urology; no coverage here at [facility]. Recommending transfer to [facility] so he evaluate for possible intervention. Spoke with Dr. Haze, ED provider, who will accept patient to [facility]. Will transfer in stable condition.  [TY]    Clinical Course User Index [DW] Midge Golas, MD [TY] Neysa Caron PARAS, DO   Medical Decision Making Amount and/or Complexity of Data Reviewed Labs: ordered. Radiology: ordered.  Risk Prescription drug management.   [facility] to see [facility]rology.  Does have some hypotension with infected obstructed stone.  Normal white count.  Sent to see urology with OR planned.  I discussed with Ole Bourdon NP from urology will see patient.  No immediate plans for admission but he will see her.       Patsey Lot, MD 12/11/23 [phone]

## 2023-12-11 NOTE — Op Note (Signed)
 Operative Note  Preoperative diagnosis:  1.  Left ureteral calculus  Post operative diagnosis: 1.  Left ureteral calculus  Procedure(s): 1.  Cystoscopy with left retrograde pyelogram and left ureteral stent placement  Surgeon: Sherwood Edison, MD  Assistants: None  Anesthesia: General  Complications: None immediate  EBL: Minimal  Specimens: 1.  None  Drains/Catheters: 1.  6 X 24 double-J ureteral stent  Intraoperative findings: 1.  Normal urethra and bladder 2.  Left retrograde pyelogram revealed a filling defect at the level of the stone with upstream hydroureteronephrosis  Indication: 72 year old female with a large left obstructing ureteral calculus and low-grade fever of 99.8 and mildly elevated heart rate.  Presents for urgent ureteral stent placement after being transferred from [facility].  Description of procedure:  The patient was identified and consent was obtained.  The patient was taken to the operating room and placed in the supine position.  The patient was placed under general anesthesia.  Perioperative antibiotics were administered.  The patient was placed in dorsal lithotomy.  Patient was prepped and draped in a standard sterile fashion and a timeout was performed.  A 21 French rigid cystoscope was advanced into the urethra and into the bladder.  The left distal most portion of the ureter was cannulated with an open-ended ureteral catheter.  Retrograde pyelogram was performed with the findings noted above.  A sensor wire was then advanced up to the kidney under fluoroscopic guidance.  A 6 X 24 double-J ureteral stent was advanced up to the kidney under fluoroscopic guidance.  The wire was withdrawn and fluoroscopy confirmed good proximal placement and direct visualization confirmed a good coil within the bladder.  The bladder was drained and the scope withdrawn.  This concluded the operation.  Patient tolerated procedure well and was stable postoperatively.  Plan: We  will admit overnight for observation.  Continue Rocephin .

## 2023-12-11 NOTE — Consult Note (Signed)
 Urology Consult Note   Requesting Attending Physician:  Patsey Lot, MD Service Providing Consult: Urology  Consulting Attending: Dr. Carolee   Reason for Consult:    HPI: [name] is seen in consultation for reasons noted above at the request of Patsey Lot, MD. Patient initially presented to [facility] for left side flank pain, nausea, and vomiting.  CT A/P revealed obstructing left-sided 12 mm UPJ stone.  However, patient had no signs of systemic infectious process with preserved renal function.  The plan was to discharge her home with close follow-up with urology.  Unfortunately she began vomiting again which could not be temporized with oral medications.  Patient was transferred to [facility] for ureteral stenting with Dr. Carolee.  I met with patient for the first time in the emergency department.  She was alert, oriented, no distress.  Despite some persistent nausea, she was stable.  ------------------  Assessment:  72 y.o. female with 12 mm left UPJ stone   Recommendations: # 12 mm left UPJ stone To the OR for ureteral stent placement with Dr. Carolee. Keep patient n.p.o. Normal white count, normothermic, hemodynamically stable. Definitive stone management on an outpatient basis  #UTI Urinalysis suggestive of nitrite positive UTI. Agree with broad-spectrum ABX  Reasonable to discharge on 2 weeks of empiric ABX with close follow-up in clinic.   Case and plan discussed with Dr. Carolee  Past Medical History: Past Medical History:  Diagnosis Date   Arthritis    Breast cancer [facility])    Family history of bone cancer    Family history of colon cancer    Family history of leukemia    Family history of lung cancer    Family history of melanoma    Family history of ovarian cancer    Family history of stomach cancer    Family history of uterine cancer    History of kidney stones    Hyperlipemia    Hypertension    Invasive ductal carcinoma  of breast (HCC)    right    radiation only   Lower extremity edema    Obesity    PAC (premature atrial contraction)     Past Surgical History:  Past Surgical History:  Procedure Laterality Date   BREAST BIOPSY Left    BREAST LUMPECTOMY WITH RADIOACTIVE SEED AND SENTINEL LYMPH NODE BIOPSY Right 06/08/2020   Procedure: RIGHT BREAST LUMPECTOMY WITH RADIOACTIVE SEED AND SENTINEL LYMPH NODE MAPPING;  Surgeon: Vanderbilt Ned, MD;  Location: [facility] OR;  Service: General;  Laterality: Right;  PEC BLOCK   CHOLECYSTECTOMY     COLONOSCOPY  01/01/2012   Procedure: COLONOSCOPY;  Surgeon: Lamar CHRISTELLA Hollingshead, MD;  Location: [facility] ENDO SUITE;  Service: Endoscopy;  Laterality: N/A;  10:00 AM   COLONOSCOPY WITH PROPOFOL  N/A 10/07/2020   Procedure: COLONOSCOPY WITH PROPOFOL ;  Surgeon: Hollingshead Lamar CHRISTELLA, MD;  Location: [facility] ENDO SUITE;  Service: Endoscopy;  Laterality: N/A;  9:45am   JOINT REPLACEMENT  2006   left knee   POLYPECTOMY  10/07/2020   Procedure: POLYPECTOMY;  Surgeon: Hollingshead Lamar CHRISTELLA, MD;  Location: [facility] ENDO SUITE;  Service: Endoscopy;;   TOTAL KNEE ARTHROPLASTY Right 01/13/2022   Procedure: TOTAL KNEE ARTHROPLASTY;  Surgeon: Edna Toribio LABOR, MD;  Location: [facility] ORS;  Service: Orthopedics;  Laterality: Right;   US  ECHOCARDIOGRAPHY  08/21/2011   LA mild to mod. dilated,mild MR,TR    Medication: Current Facility-Administered Medications  Medication Dose Route Frequency Provider Last Rate Last Admin  fentaNYL  (SUBLIMAZE ) injection 50 mcg  50 mcg Intravenous Once Patsey Lot, MD       Current Outpatient Medications  Medication Sig Dispense Refill   cephALEXin  (KEFLEX ) 500 MG capsule Take 1 capsule (500 mg total) by mouth 3 (three) times daily. 21 capsule 0   HYDROcodone -acetaminophen  (NORCO/VICODIN) 5-325 MG tablet Take 1 tablet by mouth every 6 (six) hours as needed for severe pain (pain score 7-10). 10 tablet 0   ondansetron  (ZOFRAN -ODT) 4 MG disintegrating tablet Take 1 tablet (4 mg total) by mouth  every 8 (eight) hours as needed. 8 tablet 0   cholecalciferol  (VITAMIN D ) 25 MCG (1000 UNIT) tablet Take 2,000 Units by mouth daily.     exemestane  (AROMASIN ) 25 MG tablet Take 1 tablet (25 mg total) by mouth daily after breakfast. 90 tablet 3   simvastatin  (ZOCOR ) 20 MG tablet TAKE 1 TABLET BY MOUTH DAILY AT 6 PM. 90 tablet 3   triamterene -hydrochlorothiazide  (MAXZIDE -25) 37.5-25 MG tablet TAKE 1 TABLET BY MOUTH EVERY DAY 90 tablet 3    Allergies: No Known Allergies  Social History: Social History   Tobacco Use   Smoking status: Never   Smokeless tobacco: Never  Vaping Use   Vaping status: Never Used  Substance Use Topics   Alcohol  use: No   Drug use: No    Family History Family History  Problem Relation Age of Onset   Hypertension Mother    Colon cancer Mother 18   Hypertension Father    Heart attack Father    Melanoma Father        dx. in his early 6s (x3)   Uterine cancer Maternal Aunt        dx. >50   Cancer Daughter 85       ovarian (dysgerminoma)   Leukemia Paternal Uncle        dx. in his late 75s   Stomach cancer Maternal Grandmother        dx. in her 41s   Bone cancer Maternal Aunt        dx. in her early 50s   Lung cancer Maternal Aunt    Testicular cancer Cousin        dx. in his late 30s/early 74s (maternal cousin)   Anesthesia problems Neg Hx     Review of Systems  Gastrointestinal:  Positive for nausea.  Genitourinary:  Positive for flank pain.     Objective   Vital signs in last 24 hours: BP (!) 100/54   Pulse (!) 106   Temp 98 F (36.7 C) (Oral)   Resp (!) 24   Ht 5' 3 (1.6 m)   Wt 81.6 kg   SpO2 97%   BMI 31.89 kg/m   Physical Exam General: NAD, A&O, resting, appropriate HEENT: [place]/AT Pulmonary: Normal work of breathing Cardiovascular:  no cyanosis Abdomen: Soft, NTTP, nondistended Neuro: Appropriate, no focal neurological deficits  Most Recent Labs: Lab Results  Component Value Date   WBC 9.1 12/11/2023   HGB 13.9  12/11/2023   HCT 42.4 12/11/2023   PLT 194 12/11/2023    Lab Results  Component Value Date   NA 139 12/11/2023   K 3.5 12/11/2023   CL 104 12/11/2023   CO2 23 12/11/2023   BUN 27 (H) 12/11/2023   CREATININE 0.84 12/11/2023   CALCIUM 9.6 12/11/2023    Lab Results  Component Value Date   INR 1.0 01/02/2022   APTT 28 01/02/2022     Urine Culture: @[id number] (laburin,org,[id number])@  IMAGING: CT Renal Stone Study Result Date: 12/11/2023 CLINICAL DATA:  Left flank pain. Nausea vomiting. History of kidney stones. EXAM: CT ABDOMEN AND PELVIS WITHOUT CONTRAST TECHNIQUE: Multidetector CT imaging of the abdomen and pelvis was performed following the standard protocol without IV contrast. RADIATION DOSE REDUCTION: This exam was performed according to the departmental dose-optimization program which includes automated exposure control, adjustment of the mA and/or kV according to patient size and/or use of iterative reconstruction technique. COMPARISON:  12/11/2011 FINDINGS: Lower chest: No acute findings. Hepatobiliary: No suspicious focal abnormality in the liver on this study without intravenous contrast. Cholecystectomy. No intrahepatic or extrahepatic biliary dilation. Pancreas: No focal mass lesion. No dilatation of the main duct. No intraparenchymal cyst. No peripancreatic edema. Spleen: No splenomegaly. No suspicious focal mass lesion. Adrenals/Urinary Tract: No adrenal nodule or mass. 3.1 cm water  density lesion upper pole right kidney is compatible with a cyst. No followup imaging is recommended. 2 nonobstructing stones are seen in the right kidney with dominant stone measuring 7 mm in the lower pole. Right ureter unremarkable. There is mild left hydronephrosis with nonobstructing 7 mm stone in the lower pole. 12 x 7 x 14 mm stone is identified at the left UPJ. There is associated left parapelvic edema. Left ureter unremarkable. The urinary bladder appears normal for the degree of  distention. Stomach/Bowel: Tiny hiatal hernia. Stomach otherwise unremarkable. Duodenum is normally positioned as is the ligament of Treitz. Duodenal diverticulum noted. No small bowel wall thickening. No small bowel dilatation. The terminal ileum is normal. The appendix is normal. No gross colonic mass. No colonic wall thickening. Diverticuli are seen scattered along the entire length of the colon without CT findings of diverticulitis. Vascular/Lymphatic: There is mild atherosclerotic calcification of the abdominal aorta without aneurysm. There is no gastrohepatic or hepatoduodenal ligament lymphadenopathy. No retroperitoneal or mesenteric lymphadenopathy. No pelvic sidewall lymphadenopathy. Reproductive: Unremarkable. Other: No intraperitoneal free fluid. Musculoskeletal: No worrisome lytic or sclerotic osseous abnormality. IMPRESSION: 1. 12 x 7 x 14 mm stone at the left UPJ with mild left hydronephrosis and associated left parapelvic edema. 2. Additional nonobstructing bilateral renal stones. 3. Tiny hiatal hernia. 4. Colonic diverticulosis without diverticulitis. 5.  Aortic Atherosclerosis (ICD10-I70.0). Electronically Signed   By: Camellia Candle M.D.   On: 12/11/2023 06:52    ------  Ole Bourdon, NP Pager: [phone]   Please contact the urology consult pager with any further questions/concerns.

## 2023-12-11 NOTE — ED Notes (Signed)
Pt placed on 2 L of oxygen via [place] due to SpO2 dropping to 88-87-88%. SpO2 now 94%

## 2023-12-11 NOTE — ED Notes (Signed)
Pt transported by [facility] to [facility]

## 2023-12-11 NOTE — H&P (Signed)
Please see my consultation note.  That is my history and physical.  Given that the patient developed low-grade fever and mild tachycardia, I have decided to admit the patient.

## 2023-12-12 LAB — CBC
HCT: 34.3 % — ABNORMAL LOW (ref 36.0–46.0)
Hemoglobin: 11.1 g/dL — ABNORMAL LOW (ref 12.0–15.0)
MCH: 28.5 pg (ref 26.0–34.0)
MCHC: 32.4 g/dL (ref 30.0–36.0)
MCV: 87.9 fL (ref 80.0–100.0)
Platelets: 120 10*3/uL — ABNORMAL LOW (ref 150–400)
RBC: 3.9 MIL/uL (ref 3.87–5.11)
RDW: 14.3 % (ref 11.5–15.5)
WBC: 20.4 10*3/uL — ABNORMAL HIGH (ref 4.0–10.5)
nRBC: 0 % (ref 0.0–0.2)

## 2023-12-12 LAB — BASIC METABOLIC PANEL
Anion gap: 8 (ref 5–15)
BUN: 27 mg/dL — ABNORMAL HIGH (ref 8–23)
CO2: 24 mmol/L (ref 22–32)
Calcium: 8.3 mg/dL — ABNORMAL LOW (ref 8.9–10.3)
Chloride: 104 mmol/L (ref 98–111)
Creatinine, Ser: 1.03 mg/dL — ABNORMAL HIGH (ref 0.44–1.00)
GFR, Estimated: 58 mL/min — ABNORMAL LOW (ref >60)
Glucose, Bld: 138 mg/dL — ABNORMAL HIGH (ref 70–99)
Potassium: 3.3 mmol/L — ABNORMAL LOW (ref 3.5–5.1)
Sodium: 136 mmol/L (ref 135–145)

## 2023-12-12 NOTE — Plan of Care (Signed)
  Problem: Education: Goal: Knowledge of General Education information will improve Description: Including pain rating scale, medication(s)/side effects and non-pharmacologic comfort measures Outcome: Progressing   Problem: Activity: Goal: Risk for activity intolerance will decrease Outcome: Progressing   Problem: Pain Management: Goal: General experience of comfort will improve Outcome: Progressing

## 2023-12-12 NOTE — Discharge Summary (Signed)
 Date of admission: 12/11/2023  Date of discharge: 12/12/2023  Admission diagnosis: Left ureteral stone  Discharge diagnosis: Left ureteral stone  Secondary diagnoses: UTI  History and Physical: For full details, please see admission history and physical. Briefly, [name] is a 73 y.o. year old patient with a left ureteral stone with low grade fever who underwent left stent placement with Dr. Carolee on 12/11/2023.   Hospital Course: The patient recovered in the usual expected fashion.  She had her diet advanced slowly.  Initially managed with IV pain control, then transitioned to PO meds when she was tolerating oral intake.  Her labs were stable throughout the hospital course.  She was discharged to home on POD#1.  At the time of discharge she was tolerating a regular diet, passing flatus, ambulating, had adequate pain control and was agreeable to discharge.  Follow up as scheduled.    Laboratory values:  Recent Labs    12/11/23 0454 12/12/23 0331  HGB 13.9 11.1*  HCT 42.4 34.3*   Recent Labs    12/11/23 0454 12/12/23 0331  CREATININE 0.84 1.03*    Disposition: Home  Discharge instruction: The patient was instructed to be ambulatory but told to refrain from heavy lifting, strenuous activity, or driving.   Discharge medications:  Allergies as of 12/12/2023   No Known Allergies      Medication List     TAKE these medications    Advil 200 MG Caps Generic drug: Ibuprofen Take 800 mg by mouth every 6 (six) hours as needed (for pain or headaches).   cephALEXin  500 MG capsule Commonly known as: KEFLEX  Take 1 capsule (500 mg total) by mouth 3 (three) times daily.   exemestane  25 MG tablet Commonly known as: AROMASIN  Take 1 tablet (25 mg total) by mouth daily after breakfast.   HYDROcodone -acetaminophen  5-325 MG tablet Commonly known as: NORCO/VICODIN Take 1 tablet by mouth every 6 (six) hours as needed for severe pain (pain score 7-10).   ondansetron  4 MG  disintegrating tablet Commonly known as: ZOFRAN -ODT Take 4 mg by mouth every 8 (eight) hours as needed for nausea or vomiting.   ondansetron  4 MG disintegrating tablet Commonly known as: ZOFRAN -ODT Take 1 tablet (4 mg total) by mouth every 8 (eight) hours as needed.   simvastatin  20 MG tablet Commonly known as: ZOCOR  TAKE 1 TABLET BY MOUTH DAILY AT 6 PM. What changed: when to take this   triamterene -hydrochlorothiazide  37.5-25 MG tablet Commonly known as: MAXZIDE -25 TAKE 1 TABLET BY MOUTH EVERY DAY   Vitamin D3 50 MCG (2000 UT) Tabs Take 2,000 Units by mouth daily after breakfast.        Followup:   Follow-up Information     McKenzie, Belvie CROME, MD. Call today.   Specialty: Urology Contact information: [address]  [address] [place] [address] [phone]                 Adina SAUNDERS. Zaylei Mullane MD [facility]  Pager: [phone]

## 2023-12-12 NOTE — Plan of Care (Signed)
  Problem: Coping: Goal: Level of anxiety will decrease Outcome: Progressing   Problem: Elimination: Goal: Will not experience complications related to bowel motility Outcome: Progressing Goal: Will not experience complications related to urinary retention Outcome: Progressing   Problem: Pain Management: Goal: General experience of comfort will improve Outcome: Progressing   Problem: Safety: Goal: Ability to remain free from injury will improve Outcome: Progressing

## 2023-12-13 ENCOUNTER — Encounter (HOSPITAL_COMMUNITY): Payer: Self-pay | Admitting: Urology

## 2023-12-13 LAB — URINE CULTURE: Culture: >=100000 — AB

## 2023-12-13 NOTE — Anesthesia Postprocedure Evaluation (Signed)
 Anesthesia Post Note  Patient: [name]  Procedure(s) Performed: CYSTOSCOPY WITH RETROGRADE PYELOGRAM, URETEROSCOPY AND STENT PLACEMENT (Left)     Patient location during evaluation: PACU Anesthesia Type: General Level of consciousness: awake and alert Pain management: pain level controlled Vital Signs Assessment: post-procedure vital signs reviewed and stable Respiratory status: spontaneous breathing, nonlabored ventilation, respiratory function stable and patient connected to nasal cannula oxygen Cardiovascular status: blood pressure returned to baseline and stable Postop Assessment: no apparent nausea or vomiting Anesthetic complications: no   No notable events documented.               Jaidah Lomax D Benedetto Ryder

## 2023-12-17 ENCOUNTER — Ambulatory Visit: Payer: Medicare PPO | Admitting: Urology

## 2023-12-19 DIAGNOSIS — N201 Calculus of ureter: Secondary | ICD-10-CM | POA: Diagnosis not present

## 2023-12-19 DIAGNOSIS — N200 Calculus of kidney: Secondary | ICD-10-CM | POA: Diagnosis not present

## 2023-12-19 NOTE — Assessment & Plan Note (Addendum)
 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 -S/p right breast lumpectomy and SLNB with Dr Vanderbilt on 06/08/20. Her RS was 24.  Adjuvant chemotherapy was not recommended, she completed adjuvant Radiation. -She began antiestrogen therapy with Exemestane  in 09/2020, tolerating well overall.  -[name] is clinically doing well. Most recent mammogram 04/2023 was negative, breast density category C.  We discussed the role of additional screening MRI in certain patients. In the past she had declined this imaging. She is now interested in this. Will send order and get the scheduled for her in next few weeks.  -Continue breast cancer surveillance and exemestane  was refilled -Follow-up in 6 months, or sooner if needed

## 2023-12-19 NOTE — Progress Notes (Signed)
 Patient Care Team: Duanne Butler DASEN, MD as PCP - General (Family Medicine) Tyree Nanetta SAILOR, RN as Oncology Nurse Navigator Glean Stephane BROCKS, RN as Oncology Nurse Navigator Vanderbilt Ned, MD as Consulting Physician (General Surgery) Lanny Callander, MD as Consulting Physician (Hematology) Shannon Agent, MD as Consulting Physician (Radiation Oncology) Burton, Lacie K, NP as Nurse Practitioner (Nurse Practitioner)  Clinic Day:  12/20/2023  Referring physician: Duanne Butler DASEN, MD  ASSESSMENT & PLAN:   Assessment & Plan: malignant neoplasm right central breast  ER+/PR+/HER2-, Grade 2, pT1cN0M0, Oncotype RS 24 -Diagnosed in 04/2020 with 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 -S/p right breast lumpectomy and SLNB with Dr Vanderbilt on 06/08/20. Her RS was 24.  Adjuvant chemotherapy was not recommended, she completed adjuvant Radiation. -She began antiestrogen therapy with Exemestane  in 09/2020, tolerating well overall.  -Ms. Westhoff is clinically doing well. Most recent mammogram 04/2023 was negative, breast density category C.  We discussed the role of additional screening MRI in certain patients. In the past she had declined this imaging. She is now interested in this. Will send order and get the scheduled for her in next few weeks.  -Continue breast cancer surveillance and exemestane  was refilled -Follow-up in 6 months, or sooner if needed   Plan: Labs reviewed  -CBC showing WBC 15.3; Hgb 13.2; Hct 39.6; Plt 251; Anc 11.4 -CMP - K 4.3; glucose 120; BUN 21; Creatinine 0.96; eGFR >60; Ca 9.9; LFTs normal.   -reviewed most recent mammogram from 04/2023 which was benign. She has category C breasts.  -agreeable to breast MRI due to high risk for recurrence. This was ordered today. Her recurrence score is 24 with risk of 10 year recurrence at 10% while taking exemestane  or other AI or tamoxifen.  -renew keflex  500 mg TID for next 5 days. She does follow up with urology on  Thursday, next week.  -continue exemestane  daily and breast cancer surveillance.  -labs and follow up in 6 months. She can always return sooner if needed   The patient understands the plans discussed today and is in agreement with them.  She knows to contact our office if she develops concerns prior to her next appointment.  I provided 25 minutes of face-to-face time during this encounter and > 50% was spent counseling as documented under my assessment and plan.    Powell FORBES Lessen, NP  [facility] - A DEPT OF [facility] [address] [place] [address] Dept: [phone] Dept Fax: [phone]   Orders Placed This Encounter  Procedures   MR BREAST BILATERAL W WO CONTRAST INC CAD    Standing Status:   Future    Expected Date:   01/03/2024    Expiration Date:   12/19/2024    If indicated for the ordered procedure, I authorize the administration of contrast media per Radiology protocol:   Yes    What is the patient's sedation requirement?:   No Sedation    Does the patient have a pacemaker or implanted devices?:   No    Preferred imaging location?:   [facility] (table limit - 550 lbs)      CHIEF COMPLAINT:  CC: Right breast cancer  Current Treatment: Exemestane  starting 09/2020  INTERVAL HISTORY:  Brittinie is here today for repeat clinical assessment.  She was last seen by Lacie, NP on 06/21/2023.  She had mammogram 05/03/2023 which was negative.  Continues  to take exemestane  without significant negative side effects. Has some manageable joint pain at times. No other negative side effects. Is interested in breast MRI due to high risk for breast cancer recurrence.  She was in ED last week due to kidney stone causing UTI. She finished prescription for keflex  yesterday. Had procedure to remove stone yesterday as well. WBC improved from ED visit, but still elevated. Today, her WBC is 15.3, down from 20.4 in ED last  week. She states that she feels pretty good today. She denies chest pain, chest pressure, or shortness of breath. She denies headaches or visual disturbances. She denies abdominal pain, nausea, vomiting, or changes in bowel or bladder habits.  She denies fevers or chills.  Her appetite is good. Her weight has been stable.     I have reviewed the past medical history, past surgical history, social history and family history with the patient and they are unchanged from previous note.  ALLERGIES:  has no known allergies.  MEDICATIONS:  Current Outpatient Medications  Medication Sig Dispense Refill   ADVIL 200 MG CAPS Take 800 mg by mouth every 6 (six) hours as needed (for pain or headaches).     Cholecalciferol  (VITAMIN D3) 50 MCG (2000 UT) TABS Take 2,000 Units by mouth daily after breakfast.     exemestane  (AROMASIN ) 25 MG tablet Take 1 tablet (25 mg total) by mouth daily after breakfast. 90 tablet 3   ondansetron  (ZOFRAN -ODT) 4 MG disintegrating tablet Take 1 tablet (4 mg total) by mouth every 8 (eight) hours as needed. 8 tablet 0   ondansetron  (ZOFRAN -ODT) 4 MG disintegrating tablet Take 4 mg by mouth every 8 (eight) hours as needed for nausea or vomiting.     simvastatin  (ZOCOR ) 20 MG tablet TAKE 1 TABLET BY MOUTH DAILY AT 6 PM. (Patient taking differently: Take 20 mg by mouth at bedtime.) 90 tablet 3   triamterene -hydrochlorothiazide  (MAXZIDE -25) 37.5-25 MG tablet TAKE 1 TABLET BY MOUTH EVERY DAY (Patient taking differently: Take 1 tablet by mouth daily.) 90 tablet 3   cephALEXin  (KEFLEX ) 500 MG capsule Take 1 capsule (500 mg total) by mouth 3 (three) times daily. 15 capsule 0   No current facility-administered medications for this visit.    HISTORY OF PRESENT ILLNESS:   Oncology History Overview Note  Cancer Staging malignant neoplasm right central breast Staging form: Breast, AJCC 8th Edition - Clinical stage from 05/04/2020: Stage IA (cT1, cN0, cM0, G2, ER+, PR+, HER2-) - Signed by  Burton, Lacie K, NP on 05/24/2020 - Pathologic stage from 06/08/2020: Stage IA (pT1c, pN0, cM0, G2, ER+, PR+, HER2-, Oncotype DX score: 24) - Signed by Lanny Callander, MD on 08/22/2020    malignant neoplasm right central breast  04/27/2020 Breast US    FINDINGS: Mammogram: Spot compression tomosynthesis views of the right breast were performed. There is persistence of architectural distortion in the lower central to slightly outer right breast. The area of distortion spans approximately 0.9 cm. Ultrasound: Targeted ultrasound is performed in the right breast at 6 o'clock 1 cm from the nipple demonstrating an irregular hypoechoic mass measuring 0.8 x 0.6 x 0.6 cm. The surrounding tissues are distorted. No internal blood flow identified. Targeted ultrasound of the right axilla demonstrates normal-appearing lymph nodes.   IMPRESSION: Right breast mass at 6 o'clock measuring 0.8 cm is suspicious and likely corresponds to the distortion seen mammographically.   05/04/2020 Cancer Staging   Staging form: Breast, AJCC 8th Edition - Clinical stage from 05/04/2020: Stage IA (cT1, cN0,  cM0, G2, ER+, PR+, HER2-) - Signed by Burton, Lacie K, NP on 05/24/2020   05/04/2020 Initial Biopsy   FINAL MICROSCOPIC DIAGNOSIS:  A. BREAST, RIGHT/6:00, BIOPSY:  - Invasive ductal carcinoma.  - Ductal carcinoma in situ.  - Complex sclerosing lesion. COMMENT:  The carcinoma appears grade 2.  The greatest linear extent of tumor in any one core is 7 mm.   Immunohistochemical and morphometric analysis performed manually  The tumor cells are NEGATIVE for Her2 (1+).  Estrogen Receptor:       POSITIVE, 90% STRONG STAINING  Progesterone Receptor:   POSITIVE, 90% STRONG STAINING  Proliferation Marker Ki-67:   10%    05/06/2020 Initial Diagnosis   malignant neoplasm right central breast   06/02/2020 Genetic Testing   Negative genetic testing:  No pathogenic variants detected on the [facility] Breast Cancer STAT panel +  Common Hereditary Cancers Panel. A variant of uncertain significance (VUS) was detected in the AXIN2 gene called c.1235A>C. The report date is 06/02/2020.  The Breast Cancer STAT Panel offered by [facility] includes sequencing and deletion/duplication analysis for the following 9 genes:  ATM, BRCA1, BRCA2, CDH1, CHEK2, PALB2, PTEN, STK11 and TP53. The Common Hereditary Cancers Panel offered by [facility] includes sequencing and/or deletion duplication testing of the following 48 genes: APC, ATM, AXIN2, BARD1, BMPR1A, BRCA1, BRCA2, BRIP1, CDH1, CDK4, CDKN2A (p14ARF), CDKN2A (p16INK4a), CHEK2, CTNNA1, DICER1, EPCAM (Deletion/duplication testing only), GREM1 (promoter region deletion/duplication testing only), KIT, MEN1, MLH1, MSH2, MSH3, MSH6, MUTYH, NBN, NF1, NHTL1, PALB2, PDGFRA, PMS2, POLD1, POLE, PTEN, RAD50, RAD51C, RAD51D, RNF43, SDHB, SDHC, SDHD, SMAD4, SMARCA4. STK11, TP53, TSC1, TSC2, and VHL.  The following genes were evaluated for sequence changes only: SDHA and HOXB13 c.251G>A variant only.   06/08/2020 Cancer Staging   Staging form: Breast, AJCC 8th Edition - Pathologic stage from 06/08/2020: Stage IA (pT1c, pN0, cM0, G2, ER+, PR+, HER2-, Oncotype DX score: 24) - Signed by Lanny Callander, MD on 08/22/2020   06/08/2020 Surgery   RIGHT BREAST LUMPECTOMY WITH RADIOACTIVE SEED AND SENTINEL LYMPH NODE MAPPING with Dr Vanderbilt    06/08/2020 Pathology Results   FINAL MICROSCOPIC DIAGNOSIS:   A. BREAST, RIGHT, LUMPECTOMY:  - Invasive ductal carcinoma, grade 2, spanning 1.1 cm.  - High grade ductal carcinoma in situ with necrosis.  - Biopsy site.  - Resection margins negative for invasive carcinoma.  - In situ carcinoma is <0.1 cm to the posterior margin broadly, see  comment.  - See oncology table.   B. LYMPH NODE, RIGHT AXILLARY, SENTINEL, EXCISION:  - One of one lymph nodes negative for carcinoma (0/1).    06/08/2020 Oncotype testing   Recurrence score 24  Distant recurrence risk at 9 years with AI or  Tamoxifen alone is 10% There is less than 1% benefit of adjuvant chemotherapy.    07/26/2020 - 08/25/2020 Radiation Therapy   Adjuvant Radiation with Dr Shannon 07/26/20-08/25/20   09/2020 -  Anti-estrogen oral therapy   Exemestane  25mg  once daily starting in 09/2020   11/22/2020 Survivorship   SCP delivered by Lacie Burton, NP        REVIEW OF SYSTEMS:   Constitutional: Denies fevers, chills or abnormal weight loss Eyes: Denies blurriness of vision Ears, nose, mouth, throat, and face: Denies mucositis or sore throat Respiratory: Denies cough, dyspnea or wheezes Cardiovascular: Denies palpitation, chest discomfort or lower extremity swelling Gastrointestinal:  Denies nausea, heartburn or change in bowel habits Skin: Denies abnormal skin rashes Lymphatics: Denies new lymphadenopathy or easy bruising Neurological:Denies numbness, tingling  or new weaknesses Behavioral/Psych: Mood is stable, no new changes  All other systems were reviewed with the patient and are negative.   VITALS:   Today's Vitals   12/20/23 0921 12/20/23 0922  BP: 136/77   Pulse: 65   Resp: 13   Temp: 98.2 F (36.8 C)   TempSrc: Temporal   SpO2: 97%   Weight: 182 lb (82.6 kg)   PainSc:  0-No pain   Body mass index is 32.24 kg/m.   Wt Readings from Last 3 Encounters:  12/20/23 182 lb (82.6 kg)  12/11/23 179 lb 14.3 oz (81.6 kg)  12/10/23 182 lb (82.6 kg)    Body mass index is 32.24 kg/m.  Performance status (ECOG): 1 - Symptomatic but completely ambulatory  PHYSICAL EXAM:   GENERAL:alert, no distress and comfortable SKIN: skin color, texture, turgor are normal, no rashes or significant lesions EYES: normal, Conjunctiva are pink and non-injected, sclera clear OROPHARYNX:no exudate, no erythema and lips, buccal mucosa, and tongue normal  NECK: supple, thyroid normal size, non-tender, without nodularity LYMPH:  no palpable lymphadenopathy in the cervical, axillary or inguinal LUNGS: clear to  auscultation and percussion with normal breathing effort HEART: regular rate & rhythm and no murmurs and no lower extremity edema ABDOMEN:abdomen soft, non-tender and normal bowel sounds Musculoskeletal:no cyanosis of digits and no clubbing  NEURO: alert & oriented x 3 with fluent speech, no focal motor/sensory deficits BREAST: s/p right breast lumpectomy with well healed scars to inferior aspect or areola and right axillary region. There is no nipple inversion or discharge. No palpable masses or lumps. There is no axillary lymphadenopathy on the right. There is no nipple inversion or discharge palpable on the left side. No palpable masses or lumps. There is no axillary lymphadenopathy on the left.  LABORATORY DATA:  I have reviewed the data as listed    Component Value Date/Time   NA 140 12/20/2023 0856   K 4.3 12/20/2023 0856   CL 105 12/20/2023 0856   CO2 29 12/20/2023 0856   GLUCOSE 120 (H) 12/20/2023 0856   BUN 21 12/20/2023 0856   CREATININE 0.96 12/20/2023 0856   CREATININE 0.73 12/19/2021 1159   CALCIUM 9.9 12/20/2023 0856   PROT 7.4 12/20/2023 0856   ALBUMIN 4.4 12/20/2023 0856   AST 18 12/20/2023 0856   ALT 14 12/20/2023 0856   ALKPHOS 67 12/20/2023 0856   BILITOT 1.1 12/20/2023 0856   GFRNONAA >60 12/20/2023 0856   GFRNONAA 65 03/10/2021 1122   GFRAA 76 03/10/2021 1122    Lab Results  Component Value Date   WBC 15.3 (H) 12/20/2023   NEUTROABS 11.4 (H) 12/20/2023   HGB 13.2 12/20/2023   HCT 39.6 12/20/2023   MCV 84.3 12/20/2023   PLT 251 12/20/2023      Chemistry      Component Value Date/Time   NA 140 12/20/2023 0856   K 4.3 12/20/2023 0856   CL 105 12/20/2023 0856   CO2 29 12/20/2023 0856   BUN 21 12/20/2023 0856   CREATININE 0.96 12/20/2023 0856   CREATININE 0.73 12/19/2021 1159      Component Value Date/Time   CALCIUM 9.9 12/20/2023 0856   ALKPHOS 67 12/20/2023 0856   AST 18 12/20/2023 0856   ALT 14 12/20/2023 0856   BILITOT 1.1 12/20/2023 0856        RADIOGRAPHIC STUDIES: IDG C-Arm 1-60 Min-No Report Result Date: 12/11/2023 Fluoroscopy was utilized by the requesting physician.  No radiographic interpretation.   CT Renal Stone  Study Result Date: 12/11/2023 CLINICAL DATA:  Left flank pain. Nausea vomiting. History of kidney stones. EXAM: CT ABDOMEN AND PELVIS WITHOUT CONTRAST TECHNIQUE: Multidetector CT imaging of the abdomen and pelvis was performed following the standard protocol without IV contrast. RADIATION DOSE REDUCTION: This exam was performed according to the departmental dose-optimization program which includes automated exposure control, adjustment of the mA and/or kV according to patient size and/or use of iterative reconstruction technique. COMPARISON:  12/11/2011 FINDINGS: Lower chest: No acute findings. Hepatobiliary: No suspicious focal abnormality in the liver on this study without intravenous contrast. Cholecystectomy. No intrahepatic or extrahepatic biliary dilation. Pancreas: No focal mass lesion. No dilatation of the main duct. No intraparenchymal cyst. No peripancreatic edema. Spleen: No splenomegaly. No suspicious focal mass lesion. Adrenals/Urinary Tract: No adrenal nodule or mass. 3.1 cm water  density lesion upper pole right kidney is compatible with a cyst. No followup imaging is recommended. 2 nonobstructing stones are seen in the right kidney with dominant stone measuring 7 mm in the lower pole. Right ureter unremarkable. There is mild left hydronephrosis with nonobstructing 7 mm stone in the lower pole. 12 x 7 x 14 mm stone is identified at the left UPJ. There is associated left parapelvic edema. Left ureter unremarkable. The urinary bladder appears normal for the degree of distention. Stomach/Bowel: Tiny hiatal hernia. Stomach otherwise unremarkable. Duodenum is normally positioned as is the ligament of Treitz. Duodenal diverticulum noted. No small bowel wall thickening. No small bowel dilatation. The terminal ileum  is normal. The appendix is normal. No gross colonic mass. No colonic wall thickening. Diverticuli are seen scattered along the entire length of the colon without CT findings of diverticulitis. Vascular/Lymphatic: There is mild atherosclerotic calcification of the abdominal aorta without aneurysm. There is no gastrohepatic or hepatoduodenal ligament lymphadenopathy. No retroperitoneal or mesenteric lymphadenopathy. No pelvic sidewall lymphadenopathy. Reproductive: Unremarkable. Other: No intraperitoneal free fluid. Musculoskeletal: No worrisome lytic or sclerotic osseous abnormality. IMPRESSION: 1. 12 x 7 x 14 mm stone at the left UPJ with mild left hydronephrosis and associated left parapelvic edema. 2. Additional nonobstructing bilateral renal stones. 3. Tiny hiatal hernia. 4. Colonic diverticulosis without diverticulitis. 5.  Aortic Atherosclerosis (ICD10-I70.0). Electronically Signed   By: Camellia Candle M.D.   On: 12/11/2023 06:52

## 2023-12-20 ENCOUNTER — Encounter: Payer: Self-pay | Admitting: Nurse Practitioner

## 2023-12-20 ENCOUNTER — Inpatient Hospital Stay: Payer: Medicare PPO | Attending: Nurse Practitioner | Admitting: Nurse Practitioner

## 2023-12-20 ENCOUNTER — Inpatient Hospital Stay: Payer: Medicare PPO

## 2023-12-20 VITALS — BP 136/77 | HR 65 | Temp 98.2°F | Resp 13 | Wt 182.0 lb

## 2023-12-20 DIAGNOSIS — C50911 Malignant neoplasm of unspecified site of right female breast: Secondary | ICD-10-CM | POA: Insufficient documentation

## 2023-12-20 DIAGNOSIS — Z79811 Long term (current) use of aromatase inhibitors: Secondary | ICD-10-CM | POA: Insufficient documentation

## 2023-12-20 DIAGNOSIS — Z170 Estrogen receptor positive status [ER+]: Secondary | ICD-10-CM | POA: Diagnosis not present

## 2023-12-20 LAB — CBC WITH DIFFERENTIAL (CANCER CENTER ONLY)
Abs Immature Granulocytes: 0.15 10*3/uL — ABNORMAL HIGH (ref 0.00–0.07)
Basophils Absolute: 0.1 10*3/uL (ref 0.0–0.1)
Basophils Relative: 1 %
Eosinophils Absolute: 0 10*3/uL (ref 0.0–0.5)
Eosinophils Relative: 0 %
HCT: 39.6 % (ref 36.0–46.0)
Hemoglobin: 13.2 g/dL (ref 12.0–15.0)
Immature Granulocytes: 1 %
Lymphocytes Relative: 20 %
Lymphs Abs: 3 10*3/uL (ref 0.7–4.0)
MCH: 28.1 pg (ref 26.0–34.0)
MCHC: 33.3 g/dL (ref 30.0–36.0)
MCV: 84.3 fL (ref 80.0–100.0)
Monocytes Absolute: 0.7 10*3/uL (ref 0.1–1.0)
Monocytes Relative: 5 %
Neutro Abs: 11.4 10*3/uL — ABNORMAL HIGH (ref 1.7–7.7)
Neutrophils Relative %: 73 %
Platelet Count: 251 10*3/uL (ref 150–400)
RBC: 4.7 MIL/uL (ref 3.87–5.11)
RDW: 13.1 % (ref 11.5–15.5)
WBC Count: 15.3 10*3/uL — ABNORMAL HIGH (ref 4.0–10.5)
nRBC: 0 % (ref 0.0–0.2)

## 2023-12-20 LAB — CMP (CANCER CENTER ONLY)
ALT: 14 U/L (ref 0–44)
AST: 18 U/L (ref 15–41)
Albumin: 4.4 g/dL (ref 3.5–5.0)
Alkaline Phosphatase: 67 U/L (ref 38–126)
Anion gap: 6 (ref 5–15)
BUN: 21 mg/dL (ref 8–23)
CO2: 29 mmol/L (ref 22–32)
Calcium: 9.9 mg/dL (ref 8.9–10.3)
Chloride: 105 mmol/L (ref 98–111)
Creatinine: 0.96 mg/dL (ref 0.44–1.00)
GFR, Estimated: >60 mL/min (ref >60)
Glucose, Bld: 120 mg/dL — ABNORMAL HIGH (ref 70–99)
Potassium: 4.3 mmol/L (ref 3.5–5.1)
Sodium: 140 mmol/L (ref 135–145)
Total Bilirubin: 1.1 mg/dL (ref 0.0–1.2)
Total Protein: 7.4 g/dL (ref 6.5–8.1)

## 2023-12-20 MED ORDER — CEPHALEXIN 500 MG PO CAPS: 500.0000 mg | ORAL_CAPSULE | Freq: Three times a day (TID) | ORAL | 0 refills | Status: DC

## 2023-12-27 DIAGNOSIS — N200 Calculus of kidney: Secondary | ICD-10-CM | POA: Diagnosis not present

## 2023-12-28 ENCOUNTER — Ambulatory Visit (HOSPITAL_COMMUNITY)
Admission: RE | Admit: 2023-12-28 | Discharge: 2023-12-28 | Disposition: A | Discharge status: Home / Self Care | Payer: Medicare PPO | Source: Ambulatory Visit | Attending: Nurse Practitioner

## 2023-12-28 DIAGNOSIS — C50911 Malignant neoplasm of unspecified site of right female breast: Secondary | ICD-10-CM | POA: Insufficient documentation

## 2023-12-28 MED ORDER — GADOBUTROL 1 MMOL/ML IV SOLN
8.0000 mL | Freq: Once | INTRAVENOUS | Status: AC | PRN
  Administered 2023-12-28: 8 mL via INTRAVENOUS

## 2024-01-28 DIAGNOSIS — N200 Calculus of kidney: Secondary | ICD-10-CM | POA: Diagnosis not present

## 2024-02-14 DIAGNOSIS — N200 Calculus of kidney: Secondary | ICD-10-CM | POA: Diagnosis not present

## 2024-03-05 ENCOUNTER — Other Ambulatory Visit: Payer: Self-pay | Admitting: Family Medicine

## 2024-03-06 NOTE — Telephone Encounter (Signed)
 Requested medication (s) are due for refill today: yes  Requested medication (s) are on the active medication list: yes  Last refill:  both last reordered 03/07/23 #90 3 RF  Future visit scheduled: no  Notes to clinic:  attempted to call pt several times but unable to get through. Sent pt MyChart message to call and make appt- is due OV and labs   Requested Prescriptions  Pending Prescriptions Disp Refills   triamterene-hydrochlorothiazide (MAXZIDE-25) 37.5-25 MG tablet [Pharmacy Med Name: TRIAMTERENE-HCTZ 37.5-25 MG TB] 90 tablet 3    Sig: TAKE 1 TABLET BY MOUTH EVERY DAY     Cardiovascular: Diuretic Combos Passed - 03/06/2024  1:53 PM      Passed - K in normal range and within 180 days    Potassium  Date Value Ref Range Status  12/20/2023 4.3 3.5 - 5.1 mmol/L Final         Passed - Na in normal range and within 180 days    Sodium  Date Value Ref Range Status  12/20/2023 140 135 - 145 mmol/L Final         Passed - Cr in normal range and within 180 days    Creatinine  Date Value Ref Range Status  12/20/2023 0.96 0.44 - 1.00 mg/dL Final   Creat  Date Value Ref Range Status  12/19/2021 0.73 0.60 - 1.00 mg/dL Final         Passed - Last BP in normal range    BP Readings from Last 1 Encounters:  12/20/23 136/77         Passed - Valid encounter within last 6 months    Recent Outpatient Visits           1 year ago Pure hypercholesterolemia   [facility] Pickard, Priscille Heidelberg, MD               simvastatin (ZOCOR) 20 MG tablet [Pharmacy Med Name: SIMVASTATIN 20 MG TABLET] 90 tablet 3    Sig: TAKE 1 TABLET BY MOUTH DAILY AT 6 PM.     Cardiovascular:  Antilipid - Statins Failed - 03/06/2024  1:53 PM      Failed - Lipid Panel in normal range within the last 12 months    Cholesterol  Date Value Ref Range Status  01/02/2023 191 <200 mg/dL Final   LDL Cholesterol (Calc)  Date Value Ref Range Status  01/02/2023 101 (H) mg/dL (calc) Final     Comment:    Reference range: <100 . Desirable range <100 mg/dL for primary prevention;   <70 mg/dL for patients with CHD or diabetic patients  with > or = 2 CHD risk factors. Marland Kitchen LDL-C is now calculated using the Martin-Hopkins  calculation, which is a validated novel method providing  better accuracy than the Friedewald equation in the  estimation of LDL-C.  Horald Pollen et al. Lenox Ahr. 1610;960(45): 2061-2068  (http://education.[facility].com/faq/FAQ164)    HDL  Date Value Ref Range Status  01/02/2023 67 > OR = 50 mg/dL Final   Triglycerides  Date Value Ref Range Status  01/02/2023 124 <150 mg/dL Final         Passed - Patient is not pregnant      Passed - Valid encounter within last 12 months    Recent Outpatient Visits           1 year ago Pure hypercholesterolemia   [facility] Pickard, Priscille Heidelberg, MD

## 2024-03-18 ENCOUNTER — Ambulatory Visit: Admitting: Family Medicine

## 2024-03-18 ENCOUNTER — Encounter: Payer: Self-pay | Admitting: Family Medicine

## 2024-03-18 VITALS — BP 130/80 | HR 73 | Temp 97.8°F | Ht 63.0 in | Wt 182.2 lb

## 2024-03-18 DIAGNOSIS — I10 Essential (primary) hypertension: Secondary | ICD-10-CM

## 2024-03-18 DIAGNOSIS — Z23 Encounter for immunization: Secondary | ICD-10-CM

## 2024-03-18 MED ORDER — SIMVASTATIN 20 MG PO TABS: 20.0000 mg | ORAL_TABLET | Freq: Every day | ORAL | 3 refills | Status: AC

## 2024-03-18 MED ORDER — TRIAMTERENE-HCTZ 37.5-25 MG PO TABS: 1.0000 | ORAL_TABLET | Freq: Every day | ORAL | 3 refills | Status: AC

## 2024-03-18 NOTE — Progress Notes (Signed)
 Subjective:    Patient ID: [name], female    DOB: 1951/12/04, 73 y.o.   MRN: [id number]  HPI Patient is a very pleasant 73 year old Caucasian female with a history of breast cancer in the right breast.  This was treated with lumpectomy and radiation.  Her last colonoscopy was in 2021.  They did find 1 tubular adenoma.  Due to a family history of colon cancer in her mother they recommended a repeat colonoscopy in 5 years.  She had a bone density test last year and her T-score was -0.8.    Patient continues to endorse some numbness and tingling in the toes.  She also has some low back pain that radiates around the right side into her right leg.  She declines a referral for an MRI to evaluate for lumbar radiculopathy.  Her blood pressure today is excellent at 130/80.  Since I last saw the patient, she was admitted to the hospital with a urinary tract infection secondary to a retained kidney stone.  They had to place a stent and then perform lithotripsy.  However she passed all of the stone and the stent has been removed.  Labs in the hospital were significant for an Ellis sugar as well as an elevated white blood cell count.  Therefore we are due to follow this up. Past Medical History:  Diagnosis Date   Arthritis    Breast cancer (HCC)    Family history of bone cancer    Family history of colon cancer    Family history of leukemia    Family history of lung cancer    Family history of melanoma    Family history of ovarian cancer    Family history of stomach cancer    Family history of uterine cancer    History of kidney stones    Hyperlipemia    Hypertension    Invasive ductal carcinoma of breast (HCC)    right    radiation only   Lower extremity edema    Obesity    PAC (premature atrial contraction)    Past Surgical History:  Procedure Laterality Date   BREAST BIOPSY Left    BREAST LUMPECTOMY WITH RADIOACTIVE SEED AND SENTINEL LYMPH NODE BIOPSY Right 06/08/2020   Procedure:  RIGHT BREAST LUMPECTOMY WITH RADIOACTIVE SEED AND SENTINEL LYMPH NODE MAPPING;  Surgeon: Harriette Bouillon, MD;  Location: [facility] OR;  Service: General;  Laterality: Right;  PEC BLOCK   CHOLECYSTECTOMY     COLONOSCOPY  01/01/2012   Procedure: COLONOSCOPY;  Surgeon: Corbin Ade, MD;  Location: [facility];  Service: Endoscopy;  Laterality: N/A;  10:00 AM   COLONOSCOPY WITH PROPOFOL N/A 10/07/2020   Procedure: COLONOSCOPY WITH PROPOFOL;  Surgeon: Corbin Ade, MD;  Location: [facility];  Service: Endoscopy;  Laterality: N/A;  9:45am   CYSTOSCOPY WITH RETROGRADE PYELOGRAM, URETEROSCOPY AND STENT PLACEMENT Left 12/11/2023   Procedure: CYSTOSCOPY WITH RETROGRADE PYELOGRAM, URETEROSCOPY AND STENT PLACEMENT;  Surgeon: Crista Elliot, MD;  Location: [facility];  Service: Urology;  Laterality: Left;   JOINT REPLACEMENT  2006   left knee   POLYPECTOMY  10/07/2020   Procedure: POLYPECTOMY;  Surgeon: Corbin Ade, MD;  Location: [facility];  Service: Endoscopy;;   TOTAL KNEE ARTHROPLASTY Right 01/13/2022   Procedure: TOTAL KNEE ARTHROPLASTY;  Surgeon: Joen Laura, MD;  Location: [facility];  Service: Orthopedics;  Laterality: Right;   US ECHOCARDIOGRAPHY  08/21/2011   LA mild to mod. dilated,mild  MR,TR   Current Outpatient Medications on File Prior to Visit  Medication Sig Dispense Refill   ADVIL 200 MG CAPS Take 800 mg by mouth every 6 (six) hours as needed (for pain or headaches).     Cholecalciferol (VITAMIN D3) 50 MCG (2000 UT) TABS Take 2,000 Units by mouth daily after breakfast.     exemestane (AROMASIN) 25 MG tablet Take 1 tablet (25 mg total) by mouth daily after breakfast. 90 tablet 3   simvastatin (ZOCOR) 20 MG tablet TAKE 1 TABLET BY MOUTH DAILY AT 6 PM. (Patient taking differently: Take 20 mg by mouth at bedtime.) 90 tablet 3   triamterene-hydrochlorothiazide (MAXZIDE-25) 37.5-25 MG tablet TAKE 1 TABLET BY MOUTH EVERY DAY (Patient taking differently: Take 1 tablet by mouth daily.)  90 tablet 3   No current facility-administered medications on file prior to visit.   No Known Allergies Social History   Socioeconomic History   Marital status: Married    Spouse name: Not on file   Number of children: 2   Years of education: Not on file   Highest education level: 12th grade  Occupational History   Not on file  Tobacco Use   Smoking status: Never   Smokeless tobacco: Never  Vaping Use   Vaping status: Never Used  Substance and Sexual Activity   Alcohol use: No   Drug use: No   Sexual activity: Not Currently  Other Topics Concern   Not on file  Social History Narrative   Not on file   Social Drivers of Health   Financial Resource Strain: Low Risk  (03/14/2024)   Overall Financial Resource Strain (CARDIA)    Difficulty of Paying Living Expenses: Not hard at all  Food Insecurity: No Food Insecurity (03/14/2024)   Hunger Vital Sign    Worried About Running Out of Food in the Last Year: Never true    Ran Out of Food in the Last Year: Never true  Transportation Needs: No Transportation Needs (03/14/2024)   PRAPARE - Administrator, Civil Service (Medical): No    Lack of Transportation (Non-Medical): No  Physical Activity: Insufficiently Active (03/14/2024)   Exercise Vital Sign    Days of Exercise per Week: 4 days    Minutes of Exercise per Session: 30 min  Stress: No Stress Concern Present (03/14/2024)   [employer] of Occupational Health - Occupational Stress Questionnaire    Feeling of Stress : Not at all  Social Connections: Unknown (03/14/2024)   Social Connection and Isolation Panel [NHANES]    Frequency of Communication with Friends and Family: Patient declined    Frequency of Social Gatherings with Friends and Family: Patient declined    Attends Religious Services: More than 4 times per year    Active Member of [employer] or Organizations: No    Attends Banker Meetings: Never    Marital Status: Married  Catering manager  Violence: Not At Risk (12/11/2023)   Humiliation, Afraid, Rape, and Kick questionnaire    Fear of Current or Ex-Partner: No    Emotionally Abused: No    Physically Abused: No    Sexually Abused: No     Review of Systems  All other systems reviewed and are negative.      Objective:   Physical Exam Vitals reviewed. Exam conducted with a chaperone present.  Constitutional:      General: She is not in acute distress.    Appearance: Normal appearance. She is normal weight. She is  not ill-appearing, toxic-appearing or diaphoretic.  HENT:     Head: Normocephalic and atraumatic.     Right Ear: Tympanic membrane, ear canal and external ear normal. There is no impacted cerumen.     Left Ear: Tympanic membrane, ear canal and external ear normal. There is no impacted cerumen.     Nose: Nose normal. No congestion or rhinorrhea.     Mouth/Throat:     Mouth: Mucous membranes are moist.     Pharynx: Oropharynx is clear. No oropharyngeal exudate or posterior oropharyngeal erythema.  Eyes:     General: No scleral icterus.       Right eye: No discharge.        Left eye: No discharge.     Extraocular Movements: Extraocular movements intact.     Conjunctiva/sclera: Conjunctivae normal.     Pupils: Pupils are equal, round, and reactive to light.  Neck:     Vascular: No carotid bruit.  Cardiovascular:     Rate and Rhythm: Normal rate and regular rhythm.     Pulses: Normal pulses.     Heart sounds: Normal heart sounds. No murmur heard.    No friction rub. No gallop.  Pulmonary:     Effort: Pulmonary effort is normal. No respiratory distress.     Breath sounds: Normal breath sounds. No stridor. No wheezing, rhonchi or rales.  Chest:     Chest wall: No tenderness.  Abdominal:     General: Abdomen is flat. Bowel sounds are normal. There is no distension.     Palpations: Abdomen is soft. There is no mass.     Tenderness: There is no abdominal tenderness. There is no right CVA tenderness, left  CVA tenderness, guarding or rebound.     Hernia: No hernia is present.  Genitourinary:    General: Normal vulva.     Exam position: Lithotomy position.     Labia:        Right: No rash.        Left: No rash.      Vagina: Normal. No erythema or lesions.     Cervix: No cervical motion tenderness, friability, lesion or erythema.     Uterus: Normal.      Adnexa: Right adnexa normal and left adnexa normal.       Right: No mass.         Left: No mass.    Musculoskeletal:     Cervical back: Normal range of motion and neck supple. No rigidity.     Right lower leg: No edema.     Left lower leg: No edema.  Lymphadenopathy:     Cervical: No cervical adenopathy.  Skin:    General: Skin is warm.     Coloration: Skin is not jaundiced or pale.     Findings: No bruising, erythema, lesion or rash.  Neurological:     General: No focal deficit present.     Mental Status: She is alert. Mental status is at baseline.     Cranial Nerves: No cranial nerve deficit.     Sensory: No sensory deficit.     Motor: No weakness.     Coordination: Coordination normal.     Gait: Gait normal.     Deep Tendon Reflexes: Reflexes normal.  Psychiatric:        Mood and Affect: Mood normal.        Behavior: Behavior normal.        Thought Content: Thought content normal.  Judgment: Judgment normal.           Assessment & Plan:  Benign essential HTN - Plan: CBC with Differential/Platelet, COMPLETE METABOLIC PANEL WITHOUT GFR, Lipid panel Blood pressure is excellent.  Cancer screening is up-to-date.  Recommended the shingles vaccine and the patient agreed to this.  I will check the patient with a CBC a CMP and a fasting lipid panel.  Goal LDL cholesterol is less than 100.  Patient requested the shingles vaccine today.  The remainder of her preventative care is up-to-date.

## 2024-03-18 NOTE — Addendum Note (Signed)
 Addended by: Lynnea Ferrier T on: 03/18/2024 04:04 PM   Modules accepted: Orders

## 2024-03-18 NOTE — Addendum Note (Signed)
 Addended by: Venia Carbon K on: 03/18/2024 05:01 PM   Modules accepted: Orders

## 2024-03-19 LAB — CBC WITH DIFFERENTIAL/PLATELET
Absolute Lymphocytes: 2924 {cells}/uL (ref 850–3900)
Absolute Monocytes: 437 {cells}/uL (ref 200–950)
Basophils Absolute: 49 {cells}/uL (ref 0–200)
Basophils Relative: 0.6 %
Eosinophils Absolute: 219 {cells}/uL (ref 15–500)
Eosinophils Relative: 2.7 %
HCT: 41.9 % (ref 35.0–45.0)
Hemoglobin: 13.6 g/dL (ref 11.7–15.5)
MCH: 27.4 pg (ref 27.0–33.0)
MCHC: 32.5 g/dL (ref 32.0–36.0)
MCV: 84.3 fL (ref 80.0–100.0)
MPV: 9.7 fL (ref 7.5–12.5)
Monocytes Relative: 5.4 %
Neutro Abs: 4471 {cells}/uL (ref 1500–7800)
Neutrophils Relative %: 55.2 %
Platelets: 216 10*3/uL (ref 140–400)
RBC: 4.97 10*6/uL (ref 3.80–5.10)
RDW: 13.2 % (ref 11.0–15.0)
Total Lymphocyte: 36.1 %
WBC: 8.1 10*3/uL (ref 3.8–10.8)

## 2024-03-19 LAB — COMPLETE METABOLIC PANEL WITHOUT GFR
AG Ratio: 1.9 (calc) (ref 1.0–2.5)
ALT: 16 U/L (ref 6–29)
AST: 18 U/L (ref 10–35)
Albumin: 4.8 g/dL (ref 3.6–5.1)
Alkaline phosphatase (APISO): 71 U/L (ref 37–153)
BUN: 17 mg/dL (ref 7–25)
CO2: 28 mmol/L (ref 20–32)
Calcium: 10.1 mg/dL (ref 8.6–10.4)
Chloride: 101 mmol/L (ref 98–110)
Creat: 0.72 mg/dL (ref 0.60–1.00)
Globulin: 2.5 g/dL (ref 1.9–3.7)
Glucose, Bld: 90 mg/dL (ref 65–99)
Potassium: 3.9 mmol/L (ref 3.5–5.3)
Sodium: 139 mmol/L (ref 135–146)
Total Bilirubin: 1.3 mg/dL — ABNORMAL HIGH (ref 0.2–1.2)
Total Protein: 7.3 g/dL (ref 6.1–8.1)

## 2024-03-19 LAB — LIPID PANEL
Cholesterol: 185 mg/dL (ref <200)
HDL: 60 mg/dL (ref >=50)
LDL Cholesterol (Calc): 103 mg/dL — ABNORMAL HIGH
Non-HDL Cholesterol (Calc): 125 mg/dL (ref <130)
Total CHOL/HDL Ratio: 3.1 (calc) (ref <5.0)
Triglycerides: 128 mg/dL (ref <150)

## 2024-03-31 ENCOUNTER — Other Ambulatory Visit: Payer: Self-pay

## 2024-03-31 ENCOUNTER — Emergency Department (HOSPITAL_COMMUNITY)
Admission: EM | Admit: 2024-03-31 | Discharge: 2024-03-31 | Disposition: A | Discharge status: Home / Self Care | Attending: Emergency Medicine | Admitting: Emergency Medicine

## 2024-03-31 ENCOUNTER — Encounter (HOSPITAL_COMMUNITY): Payer: Self-pay | Admitting: Emergency Medicine

## 2024-03-31 ENCOUNTER — Emergency Department (HOSPITAL_COMMUNITY)

## 2024-03-31 DIAGNOSIS — W19XXXA Unspecified fall, initial encounter: Secondary | ICD-10-CM | POA: Diagnosis not present

## 2024-03-31 DIAGNOSIS — M79602 Pain in left arm: Secondary | ICD-10-CM | POA: Diagnosis present

## 2024-03-31 DIAGNOSIS — M19012 Primary osteoarthritis, left shoulder: Secondary | ICD-10-CM | POA: Diagnosis not present

## 2024-03-31 DIAGNOSIS — S42202A Unspecified fracture of upper end of left humerus, initial encounter for closed fracture: Secondary | ICD-10-CM | POA: Diagnosis not present

## 2024-03-31 DIAGNOSIS — S42292A Other displaced fracture of upper end of left humerus, initial encounter for closed fracture: Secondary | ICD-10-CM | POA: Insufficient documentation

## 2024-03-31 MED ORDER — OXYCODONE-ACETAMINOPHEN 5-325 MG PO TABS: 1.0000 | ORAL_TABLET | Freq: Four times a day (QID) | ORAL | 0 refills | Status: DC | PRN

## 2024-03-31 MED ORDER — HYDROMORPHONE HCL 1 MG/ML IJ SOLN
0.5000 mg | Freq: Once | INTRAMUSCULAR | Status: AC
  Administered 2024-03-31: 0.5 mg via INTRAMUSCULAR
  Filled 2024-03-31: qty 0.5

## 2024-03-31 MED ORDER — MORPHINE SULFATE (PF) 4 MG/ML IV SOLN
4.0000 mg | Freq: Once | INTRAVENOUS | Status: AC
  Administered 2024-03-31: 4 mg via INTRAMUSCULAR
  Filled 2024-03-31: qty 1

## 2024-03-31 NOTE — Discharge Instructions (Addendum)
 As discussed, your broken arm requires follow-up with our orthopedic colleague, Dr. Phyllis Breeze.  Please keep the provided sling in place is much as possible until you have follow-up.  Return here for concerning changes in your condition.

## 2024-03-31 NOTE — ED Triage Notes (Signed)
 Pt presents with fall, left shoulder pain, per pt she tripped outside fell face first in dirt and left shoulder went back, denies LOC.

## 2024-03-31 NOTE — ED Provider Notes (Signed)
 [place] EMERGENCY DEPARTMENT AT [facility] Provider Note   CSN: [id number] Arrival date & time: 03/31/24  1133     History  Chief Complaint  Patient presents with   [name]    [name] is a 73 y.o. female.  HPI Patient presents with daughter who assists with the history.  Patient had a fall just prior to ED arrival.  Fall was mechanical, fall details recalled, and the patient denies pain anywhere other than her proximal left arm.  No head trauma, no head pain.  She has preserved sensation in her left wrist, hand, elbow, but pain in the proximal arm with motion.    Home Medications Prior to Admission medications   Medication Sig Start Date End Date Taking? Authorizing Provider  oxyCODONE -acetaminophen  (PERCOCET/ROXICET) 5-325 MG tablet Take 1 tablet by mouth every 6 (six) hours as needed for severe pain (pain score 7-10). 03/31/24  Yes Dorenda Gandy, MD  ADVIL 200 MG CAPS Take 800 mg by mouth every 6 (six) hours as needed (for pain or headaches).    [provider]  Cholecalciferol  (VITAMIN D3) 50 MCG (2000 UT) TABS Take 2,000 Units by mouth daily after breakfast.    [provider]  exemestane  (AROMASIN ) 25 MG tablet Take 1 tablet (25 mg total) by mouth daily after breakfast. 06/21/23   Burton, Lacie K, NP  simvastatin  (ZOCOR ) 20 MG tablet Take 1 tablet (20 mg total) by mouth daily at 6 PM. 03/18/24   Austine Lefort, MD  triamterene -hydrochlorothiazide  (MAXZIDE -25) 37.5-25 MG tablet Take 1 tablet by mouth daily. 03/18/24   Austine Lefort, MD      Allergies    Patient has no known allergies.    Review of Systems   Review of Systems  Physical Exam Updated Vital Signs BP (!) 155/81   Pulse 72   Temp 98.1 F (36.7 C) (Oral)   Resp 20   Ht 5\' 2"  (1.575 m)   Wt 81.6 kg   SpO2 98%   BMI 32.92 kg/m  Physical Exam Vitals and nursing note reviewed.  Constitutional:      General: She is not in acute distress.    Appearance: She is  well-developed.  HENT:     Head: Normocephalic and atraumatic.  Eyes:     Conjunctiva/sclera: Conjunctivae normal.  Cardiovascular:     Rate and Rhythm: Normal rate and regular rhythm.     Pulses: Normal pulses.  Pulmonary:     Effort: Pulmonary effort is normal. No respiratory distress.     Breath sounds: No stridor.  Abdominal:     General: There is no distension.  Musculoskeletal:       Arms:  Skin:    General: Skin is warm and dry.  Neurological:     Mental Status: She is alert and oriented to person, place, and time.     Cranial Nerves: No cranial nerve deficit.  Psychiatric:        Mood and Affect: Mood normal.     ED Results / Procedures / Treatments   Labs (all labs ordered are listed, but only abnormal results are displayed) Labs Reviewed - No data to display  EKG None  Radiology No results found.  Procedures Procedures    Medications Ordered in ED Medications  HYDROmorphone  (DILAUDID ) injection 0.5 mg (has no administration in time range)  morphine  (PF) 4 MG/ML injection 4 mg (4 mg Intramuscular Given 03/31/24 1214)    ED Course/ Medical Decision Making/ A&P  Medical Decision Making Adult female presents after mechanical fall with left arm pain, concern for dislocation versus fracture.  X-rays ordered from triage reviewed by myself demonstrated at bedside with proximal humerus fracture.  I discussed this with Dr. Phyllis Breeze and the patient will follow-up as an outpatient after immobilization with sling here.  Amount and/or Complexity of Data Reviewed Independent Historian:     Details: Daughter at bedside Radiology: independent interpretation performed. Decision-making details documented in ED Course.  Risk Prescription drug management.  Final Clinical Impression(s) / ED Diagnoses Final diagnoses:  Fall, initial encounter  Closed fracture of head of left humerus, initial encounter    Rx / DC Orders ED Discharge  Orders          Ordered    oxyCODONE -acetaminophen  (PERCOCET/ROXICET) 5-325 MG tablet  Every 6 hours PRN        03/31/24 1328              Dorenda Gandy, MD 03/31/24 1329

## 2024-04-01 ENCOUNTER — Ambulatory Visit
Admission: EM | Admit: 2024-04-01 | Discharge: 2024-04-01 | Disposition: A | Discharge status: Home / Self Care | Attending: Family Medicine | Admitting: Family Medicine

## 2024-04-01 DIAGNOSIS — S42202D Unspecified fracture of upper end of left humerus, subsequent encounter for fracture with routine healing: Secondary | ICD-10-CM | POA: Diagnosis not present

## 2024-04-01 DIAGNOSIS — S40022A Contusion of left upper arm, initial encounter: Secondary | ICD-10-CM

## 2024-04-01 MED ORDER — KETOROLAC TROMETHAMINE 30 MG/ML IJ SOLN
30.0000 mg | Freq: Once | INTRAMUSCULAR | Status: AC
  Administered 2024-04-01: 30 mg via INTRAMUSCULAR

## 2024-04-01 NOTE — ED Triage Notes (Signed)
 Pt reports she fell yesterday and broke her left shoulder and now she has developed a large lump on the top part of her left arm today. States the area is warm to the touch

## 2024-04-01 NOTE — ED Provider Notes (Signed)
 [facility]    CSN: [id number] Arrival date & time: 04/01/24  1853      History   Chief Complaint No chief complaint on file.   HPI [name] is a 73 y.o. female.   Patient presenting today with a large area of bruising and swelling to the bicep region following a fracture to the humeral head and neck diagnosed yesterday in the emergency department.  She states she fell onto the shoulder yesterday and was treated for a fracture with immobilization, oxycodone  as needed.  She states her pain is under poor control and they noticed a significant area of bruising, swelling and warmth, up this evening that was not there previously.  Denies numbness, tingling, weakness of the arm or hand or swelling to the forearm and hand.  She states she has follow-up with orthopedics tomorrow.    Past Medical History:  Diagnosis Date   Arthritis    Breast cancer (HCC)    Family history of bone cancer    Family history of colon cancer    Family history of leukemia    Family history of lung cancer    Family history of melanoma    Family history of ovarian cancer    Family history of stomach cancer    Family history of uterine cancer    History of kidney stones    Hyperlipemia    Hypertension    Invasive ductal carcinoma of breast (HCC)    right    radiation only   Lower extremity edema    Obesity    PAC (premature atrial contraction)     Patient Active Problem List   Diagnosis Date Noted   UTI (urinary tract infection) 12/11/2023   S/P total knee arthroplasty, right 01/13/2022   Genetic testing 06/04/2020   Family history of ovarian cancer    Family history of colon cancer    Family history of melanoma    Family history of bone cancer    Family history of uterine cancer    Family history of stomach cancer    Family history of leukemia    Family history of lung cancer    malignant neoplasm right central breast    Vitamin D  deficiency 06/20/2017   Hyperlipidemia  07/17/2013   HTN (hypertension) 07/17/2013   PAC (premature atrial contractions) 07/17/2013    Past Surgical History:  Procedure Laterality Date   BREAST BIOPSY Left    BREAST LUMPECTOMY WITH RADIOACTIVE SEED AND SENTINEL LYMPH NODE BIOPSY Right 06/08/2020   Procedure: RIGHT BREAST LUMPECTOMY WITH RADIOACTIVE SEED AND SENTINEL LYMPH NODE MAPPING;  Surgeon: Sim Dryer, MD;  Location: [facility];  Service: General;  Laterality: Right;  PEC BLOCK   CHOLECYSTECTOMY     COLONOSCOPY  01/01/2012   Procedure: COLONOSCOPY;  Surgeon: Suzette Espy, MD;  [facility];  Service: Endoscopy;  Laterality: N/A;  10:00 AM   COLONOSCOPY WITH PROPOFOL  N/A 10/07/2020   Procedure: COLONOSCOPY WITH PROPOFOL ;  Surgeon: Suzette Espy, MD;  [facility];  Service: Endoscopy;  Laterality: N/A;  9:45am   CYSTOSCOPY WITH RETROGRADE PYELOGRAM, URETEROSCOPY AND STENT PLACEMENT Left 12/11/2023   Procedure: CYSTOSCOPY WITH RETROGRADE PYELOGRAM, URETEROSCOPY AND STENT PLACEMENT;  Surgeon: Samson Croak, MD;  Location: [facility];  Service: Urology;  Laterality: Left;   JOINT REPLACEMENT  2006   left knee   POLYPECTOMY  10/07/2020   Procedure: POLYPECTOMY;  Surgeon: Suzette Espy, MD;  [facility];  Service: Endoscopy;;   TOTAL KNEE ARTHROPLASTY Right 01/13/2022   Procedure: TOTAL KNEE ARTHROPLASTY;  Surgeon: Murleen Arms, MD;  Location: [facility];  Service: Orthopedics;  Laterality: Right;   US  ECHOCARDIOGRAPHY  08/21/2011   LA mild to mod. dilated,mild MR,TR    OB History   No obstetric history on file.      Home Medications    Prior to Admission medications   Medication Sig Start Date End Date Taking? Authorizing Provider  ADVIL 200 MG CAPS Take 800 mg by mouth every 6 (six) hours as needed (for pain or headaches).    [provider]  Cholecalciferol  (VITAMIN D3) 50 MCG (2000 UT) TABS Take 2,000 Units by mouth daily after breakfast.    [provider]   exemestane  (AROMASIN ) 25 MG tablet Take 1 tablet (25 mg total) by mouth daily after breakfast. 06/21/23   Burton, Lacie K, NP  oxyCODONE -acetaminophen  (PERCOCET/ROXICET) 5-325 MG tablet Take 1 tablet by mouth every 6 (six) hours as needed for severe pain (pain score 7-10). 03/31/24   Dorenda Gandy, MD  simvastatin  (ZOCOR ) 20 MG tablet Take 1 tablet (20 mg total) by mouth daily at 6 PM. 03/18/24   Austine Lefort, MD  triamterene -hydrochlorothiazide  (MAXZIDE -25) 37.5-25 MG tablet Take 1 tablet by mouth daily. 03/18/24   Austine Lefort, MD    Family History Family History  Problem Relation Age of Onset   Hypertension Mother    Colon cancer Mother 79   Hypertension Father    Heart attack Father    Melanoma Father        dx. in his early 6s (x3)   Uterine cancer Maternal Aunt        dx. >50   Cancer Daughter 62       ovarian (dysgerminoma)   Leukemia Paternal Uncle        dx. in his late 58s   Stomach cancer Maternal Grandmother        dx. in her 68s   Bone cancer Maternal Aunt        dx. in her early 16s   Lung cancer Maternal Aunt    Testicular cancer Cousin        dx. in his late 30s/early 47s (maternal cousin)   Anesthesia problems Neg Hx     Social History Social History   Tobacco Use   Smoking status: Never   Smokeless tobacco: Never  Vaping Use   Vaping status: Never Used  Substance Use Topics   Alcohol  use: No   Drug use: No     Allergies   Patient has no known allergies.   Review of Systems Review of Systems Per HPI  Physical Exam Triage Vital Signs ED Triage Vitals  Encounter Vitals Group     BP 04/01/24 1923 (!) 153/86     Systolic BP Percentile --      Diastolic BP Percentile --      Pulse Rate 04/01/24 1923 75     Resp 04/01/24 1923 18     Temp 04/01/24 1923 98.6 F (37 C)     Temp Source 04/01/24 1923 Oral     SpO2 04/01/24 1923 94 %     Weight --      Height --      Head Circumference --      Peak Flow --      Pain Score 04/01/24  1922 8     Pain Loc --      Pain  Education --      Exclude from [employer] Chart --    No data found.  Updated Vital Signs BP (!) 153/86 (BP Location: Right Arm)   Pulse 75   Temp 98.6 F (37 C) (Oral)   Resp 18   SpO2 94%   Visual Acuity Right Eye Distance:   Left Eye Distance:   Bilateral Distance:    Right Eye Near:   Left Eye Near:    Bilateral Near:     Physical Exam Vitals and nursing note reviewed.  Constitutional:      Appearance: Normal appearance. She is not ill-appearing.  HENT:     Head: Atraumatic.     Mouth/Throat:     Mouth: Mucous membranes are moist.  Eyes:     Extraocular Movements: Extraocular movements intact.     Conjunctiva/sclera: Conjunctivae normal.  Cardiovascular:     Rate and Rhythm: Normal rate and regular rhythm.     Heart sounds: Normal heart sounds.  Pulmonary:     Effort: Pulmonary effort is normal.     Breath sounds: Normal breath sounds.  Musculoskeletal:     Cervical back: Normal range of motion and neck supple.     Comments: Limited range of motion of the left shoulder due to fracture and severe pain.  Grip strength full and equal bilateral hands  Skin:    General: Skin is warm and dry.     Findings: Bruising present.     Comments: hematoma present to the left upper arm, no palpable masses or deformities in this area, range of motion intact, no appreciable edema to the forearm or hand  Neurological:     Mental Status: She is alert.     Comments: Bilateral upper extremities neurovascularly intact  Psychiatric:        Mood and Affect: Mood normal.        Thought Content: Thought content normal.        Judgment: Judgment normal.      UC Treatments / Results  Labs (all labs ordered are listed, but only abnormal results are displayed) Labs Reviewed - No data to display  EKG   Radiology DG Shoulder Left Result Date: 03/31/2024 CLINICAL DATA:  Left shoulder pain status post fall EXAM: LEFT SHOULDER - 2+ VIEW COMPARISON:   None available FINDINGS: Comminuted left humeral head and neck fracture extending to the glenohumeral joint. Moderate degenerative changes of the glenohumeral and acromioclavicular joint. IMPRESSION: Comminuted left humeral head and neck fracture extending to the glenohumeral joint. Electronically Signed   By: Elester Grim M.D.   On: 03/31/2024 13:45    Procedures Procedures (including critical care time)  Medications Ordered in UC Medications  ketorolac  (TORADOL ) 30 MG/ML injection 30 mg (30 mg Intramuscular Given 04/01/24 1956)    Initial Impression / Assessment and Plan / UC Course  I have reviewed the triage vital signs and the nursing notes.  Pertinent labs & imaging results that were available during my care of the patient were reviewed by me and considered in my medical decision making (see chart for details).     Consistent with hematoma, very low suspicion for upper extremity DVT but offered ultrasound for reassurance and rule out.  They wish to defer this at this time and follow-up with orthopedics as scheduled tomorrow.  Discussed supportive home care for hematoma.  She also states her pain is under poor control with the oxycodone , IM Toradol  given and instructions for supplemental pain care with over-the-counter pain  relievers reviewed. Final Clinical Impressions(s) / UC Diagnoses   Final diagnoses:  Closed fracture of proximal end of left humerus with routine healing, unspecified fracture morphology, subsequent encounter  Contusion of left upper extremity, initial encounter     Discharge Instructions      We have given you a shot of Toradol  today which is an anti-inflammatory medication similar to Advil or ibuprofen.  This should stay in your system for about 48 hours so for the next 48 hours avoid any over-the-counter NSAID medications such as Aleve, ibuprofen, Advil.  You may continue to take your pain medication prescribed by the emergency department as well as some  supplemental Tylenol  to that to exceed no more than 1000 mg of Tylenol  every 6 hours.  Follow-up as scheduled with orthopedics    ED Prescriptions   None    PDMP not reviewed this encounter.   Corbin Dess, [place] 04/01/24 2005

## 2024-04-01 NOTE — Discharge Instructions (Signed)
 We have given you a shot of Toradol  today which is an anti-inflammatory medication similar to Advil or ibuprofen.  This should stay in your system for about 48 hours so for the next 48 hours avoid any over-the-counter NSAID medications such as Aleve, ibuprofen, Advil.  You may continue to take your pain medication prescribed by the emergency department as well as some supplemental Tylenol  to that to exceed no more than 1000 mg of Tylenol  every 6 hours.  Follow-up as scheduled with orthopedics

## 2024-04-02 DIAGNOSIS — M25512 Pain in left shoulder: Secondary | ICD-10-CM | POA: Diagnosis not present

## 2024-04-03 ENCOUNTER — Other Ambulatory Visit: Payer: Self-pay | Admitting: Orthopedic Surgery

## 2024-04-03 DIAGNOSIS — S4292XA Fracture of left shoulder girdle, part unspecified, initial encounter for closed fracture: Secondary | ICD-10-CM

## 2024-04-09 ENCOUNTER — Ambulatory Visit
Admission: RE | Admit: 2024-04-09 | Discharge: 2024-04-09 | Discharge status: Home / Self Care | Source: Ambulatory Visit | Attending: Orthopedic Surgery

## 2024-04-09 DIAGNOSIS — S4292XA Fracture of left shoulder girdle, part unspecified, initial encounter for closed fracture: Secondary | ICD-10-CM

## 2024-04-09 DIAGNOSIS — M19012 Primary osteoarthritis, left shoulder: Secondary | ICD-10-CM | POA: Diagnosis not present

## 2024-04-09 DIAGNOSIS — S42212A Unspecified displaced fracture of surgical neck of left humerus, initial encounter for closed fracture: Secondary | ICD-10-CM | POA: Diagnosis not present

## 2024-04-09 DIAGNOSIS — M62572 Muscle wasting and atrophy, not elsewhere classified, left ankle and foot: Secondary | ICD-10-CM | POA: Diagnosis not present

## 2024-04-14 DIAGNOSIS — S42292A Other displaced fracture of upper end of left humerus, initial encounter for closed fracture: Secondary | ICD-10-CM | POA: Diagnosis not present

## 2024-04-14 DIAGNOSIS — M25512 Pain in left shoulder: Secondary | ICD-10-CM | POA: Diagnosis not present

## 2024-04-17 DIAGNOSIS — S42242A 4-part fracture of surgical neck of left humerus, initial encounter for closed fracture: Secondary | ICD-10-CM | POA: Diagnosis not present

## 2024-04-17 DIAGNOSIS — M19012 Primary osteoarthritis, left shoulder: Secondary | ICD-10-CM | POA: Diagnosis not present

## 2024-04-17 DIAGNOSIS — G8918 Other acute postprocedural pain: Secondary | ICD-10-CM | POA: Diagnosis not present

## 2024-04-30 DIAGNOSIS — M19012 Primary osteoarthritis, left shoulder: Secondary | ICD-10-CM | POA: Diagnosis not present

## 2024-05-13 ENCOUNTER — Other Ambulatory Visit: Payer: Self-pay | Admitting: Nurse Practitioner

## 2024-05-13 DIAGNOSIS — Z9889 Other specified postprocedural states: Secondary | ICD-10-CM

## 2024-06-02 DIAGNOSIS — Z96612 Presence of left artificial shoulder joint: Secondary | ICD-10-CM | POA: Diagnosis not present

## 2024-06-02 DIAGNOSIS — M6281 Muscle weakness (generalized): Secondary | ICD-10-CM | POA: Diagnosis not present

## 2024-06-02 DIAGNOSIS — M25612 Stiffness of left shoulder, not elsewhere classified: Secondary | ICD-10-CM | POA: Diagnosis not present

## 2024-06-02 DIAGNOSIS — S42202D Unspecified fracture of upper end of left humerus, subsequent encounter for fracture with routine healing: Secondary | ICD-10-CM | POA: Diagnosis not present

## 2024-06-03 DIAGNOSIS — N130 Hydronephrosis with ureteropelvic junction obstruction: Secondary | ICD-10-CM | POA: Diagnosis not present

## 2024-06-03 DIAGNOSIS — N200 Calculus of kidney: Secondary | ICD-10-CM | POA: Diagnosis not present

## 2024-06-05 ENCOUNTER — Ambulatory Visit: Payer: Self-pay

## 2024-06-05 NOTE — Telephone Encounter (Signed)
 FYI Only or Action Required?: FYI only for provider.  Patient was last seen in primary care on 03/18/2024 by Duanne Butler DASEN, MD. Called Nurse Triage reporting Rash. Symptoms began several days ago. Interventions attempted: OTC medications: Oral and topical benadryl , Hydrocortisone cream. Symptoms are: gradually worsening. Itchy rash started on the back of her legs, now also on her face since Tuesday.  Triage Disposition: See Physician Within 24 Hours  Patient/caregiver understands and will follow disposition?: Yes                  Copied from CRM [id number]. Topic: Clinical - Red Word Triage >> Jun 05, 2024  8:04 AM Mesmerise C wrote: [facility] that prompted transfer to Nurse Triage: Patient stated she's having a bad rash on the back of both legs and transferred to her face as well and it's been very itchy noticed Monday after the shower hasn't made any progress Reason for Disposition  SEVERE itching (i.e., interferes with sleep, normal activities or school)  Answer Assessment - Initial Assessment Questions 1. APPEARANCE of RASH: Describe the rash. (e.g., spots, blisters, raised areas, skin peeling, scaly)     red 2. SIZE: How big are the spots? (e.g., tip of pen, eraser, coin; inches, centimeters)     Splotches - on leg 3. LOCATION: Where is the rash located?     Back of legs and now face 4. COLOR: What color is the rash? (Note: It is difficult to assess rash color in people with darker-colored skin. When this situation occurs, simply ask the caller to describe what they see.)     red 5. ONSET: When did the rash begin?     Monday 6. FEVER: Do you have a fever? If Yes, ask: What is your temperature, how was it measured, and when did it start?     no 7. ITCHING: Does the rash itch? If Yes, ask: How bad is the itch? (Scale 1-10; or mild, moderate, severe)     Yes 8. CAUSE: What do you think is causing the rash?     No idea 9. MEDICINE FACTORS: Have you  started any new medicines within the last 2 weeks? (e.g., antibiotics)      no 10. OTHER SYMPTOMS: Do you have any other symptoms? (e.g., dizziness, headache, sore throat, joint pain)       no  Protocols used: Rash or Redness - [facility]

## 2024-06-06 ENCOUNTER — Encounter: Payer: Self-pay | Admitting: Family Medicine

## 2024-06-06 ENCOUNTER — Ambulatory Visit: Admitting: Family Medicine

## 2024-06-06 VITALS — BP 138/83 | HR 96 | Temp 98.1°F | Ht 62.0 in | Wt 186.5 lb

## 2024-06-06 DIAGNOSIS — L282 Other prurigo: Secondary | ICD-10-CM | POA: Diagnosis not present

## 2024-06-06 MED ORDER — MOMETASONE FUROATE 0.1 % EX CREA: TOPICAL_CREAM | Freq: Every day | CUTANEOUS | 1 refills | Status: AC

## 2024-06-06 MED ORDER — PREDNISONE 20 MG PO TABS: ORAL_TABLET | ORAL | 0 refills | Status: DC

## 2024-06-06 NOTE — Progress Notes (Signed)
 Subjective:    Patient ID: [name], female    DOB: 07-Apr-1951, 73 y.o.   MRN: [id number]  HPI Patient presents today with a papular rash on both legs distal to the knee.  Each papule is about 2 mm in diameter.  It is erythematous.  It is very itchy.  There is a halo of hypopigmentation surrounding each papule.  She also reports a few papules on her chest where the sun exposure is.  She states that there also are a few papules on her upper medial thighs.  However the rash primarily on her legs distal to her knees.  Extremely itchy but she denies any new exposure.   She denies any new medications.  She denies any fever or chills. Past Medical History:  Diagnosis Date   Arthritis    Breast cancer (HCC)    Family history of bone cancer    Family history of colon cancer    Family history of leukemia    Family history of lung cancer    Family history of melanoma    Family history of ovarian cancer    Family history of stomach cancer    Family history of uterine cancer    History of kidney stones    Hyperlipemia    Hypertension    Invasive ductal carcinoma of breast (HCC)    right    radiation only   Lower extremity edema    Obesity    PAC (premature atrial contraction)    Past Surgical History:  Procedure Laterality Date   BREAST BIOPSY Left    BREAST LUMPECTOMY WITH RADIOACTIVE SEED AND SENTINEL LYMPH NODE BIOPSY Right 06/08/2020   Procedure: RIGHT BREAST LUMPECTOMY WITH RADIOACTIVE SEED AND SENTINEL LYMPH NODE MAPPING;  Surgeon: Vanderbilt Ned, MD;  Location: [facility];  Service: General;  Laterality: Right;  PEC BLOCK   CHOLECYSTECTOMY     COLONOSCOPY  01/01/2012   Procedure: COLONOSCOPY;  Surgeon: Lamar CHRISTELLA Hollingshead, MD;  [facility];  Service: Endoscopy;  Laterality: N/A;  10:00 AM   COLONOSCOPY WITH PROPOFOL  N/A 10/07/2020   Procedure: COLONOSCOPY WITH PROPOFOL ;  Surgeon: Hollingshead Lamar CHRISTELLA, MD;  [facility];  Service: Endoscopy;  Laterality: N/A;  9:45am    CYSTOSCOPY WITH RETROGRADE PYELOGRAM, URETEROSCOPY AND STENT PLACEMENT Left 12/11/2023   Procedure: CYSTOSCOPY WITH RETROGRADE PYELOGRAM, URETEROSCOPY AND STENT PLACEMENT;  Surgeon: Carolee Sherwood JONETTA DOUGLAS, MD;  Location: [facility];  Service: Urology;  Laterality: Left;   JOINT REPLACEMENT  2006   left knee   POLYPECTOMY  10/07/2020   Procedure: POLYPECTOMY;  Surgeon: Hollingshead Lamar CHRISTELLA, MD;  [facility];  Service: Endoscopy;;   TOTAL KNEE ARTHROPLASTY Right 01/13/2022   Procedure: TOTAL KNEE ARTHROPLASTY;  Surgeon: Edna Toribio LABOR, MD;  Location: [facility];  Service: Orthopedics;  Laterality: Right;   US  ECHOCARDIOGRAPHY  08/21/2011   LA mild to mod. dilated,mild MR,TR   Current Outpatient Medications on File Prior to Visit  Medication Sig Dispense Refill   ADVIL 200 MG CAPS Take 800 mg by mouth every 6 (six) hours as needed (for pain or headaches).     Cholecalciferol  (VITAMIN D3) 50 MCG (2000 UT) TABS Take 2,000 Units by mouth daily after breakfast.     exemestane  (AROMASIN ) 25 MG tablet Take 1 tablet (25 mg total) by mouth daily after breakfast. 90 tablet 3   simvastatin  (ZOCOR ) 20 MG tablet Take 1 tablet (20 mg total) by mouth daily at 6 PM.  90 tablet 3   triamterene -hydrochlorothiazide  (MAXZIDE -25) 37.5-25 MG tablet Take 1 tablet by mouth daily. 90 tablet 3   oxyCODONE -acetaminophen  (PERCOCET/ROXICET) 5-325 MG tablet Take 1 tablet by mouth every 6 (six) hours as needed for severe pain (pain score 7-10). (Patient not taking: Reported on 06/06/2024) 10 tablet 0   No current facility-administered medications on file prior to visit.   No Known Allergies Social History   Socioeconomic History   Marital status: Married    Spouse name: Not on file   Number of children: 2   Years of education: Not on file   Highest education level: 12th grade  Occupational History   Not on file  Tobacco Use   Smoking status: Never   Smokeless tobacco: Never  Vaping Use   Vaping status: Never Used   Substance and Sexual Activity   Alcohol  use: No   Drug use: No   Sexual activity: Not Currently  Other Topics Concern   Not on file  Social History Narrative   Not on file   Social Drivers of Health   Financial Resource Strain: Low Risk  (06/05/2024)   Overall Financial Resource Strain (CARDIA)    Difficulty of Paying Living Expenses: Not hard at all  Food Insecurity: No Food Insecurity (06/05/2024)   Hunger Vital Sign    Worried About Running Out of Food in the Last Year: Never true    Ran Out of Food in the Last Year: Never true  Transportation Needs: No Transportation Needs (06/05/2024)   PRAPARE - Administrator, Civil Service (Medical): No    Lack of Transportation (Non-Medical): No  Physical Activity: Inactive (06/05/2024)   Exercise Vital Sign    Days of Exercise per Week: 0 days    Minutes of Exercise per Session: Not on file  Stress: No Stress Concern Present (06/05/2024)   [employer] of Occupational Health - Occupational Stress Questionnaire    Feeling of Stress: Not at all  Social Connections: Moderately Integrated (06/05/2024)   Social Connection and Isolation Panel    Frequency of Communication with Friends and Family: Once a week    Frequency of Social Gatherings with Friends and Family: Once a week    Attends Religious Services: More than 4 times per year    Active Member of [employer] or Organizations: Yes    Attends Banker Meetings: 1 to 4 times per year    Marital Status: Married  Catering manager Violence: Not At Risk (12/11/2023)   Humiliation, Afraid, Rape, and Kick questionnaire    Fear of Current or Ex-Partner: No    Emotionally Abused: No    Physically Abused: No    Sexually Abused: No     Review of Systems  All other systems reviewed and are negative.      Objective:   Physical Exam Vitals reviewed. Exam conducted with a chaperone present.  Constitutional:      General: She is not in acute distress.    Appearance:  Normal appearance. She is normal weight. She is not ill-appearing, toxic-appearing or diaphoretic.  HENT:     Head: Normocephalic and atraumatic.  Neck:     Vascular: No carotid bruit.   Cardiovascular:     Rate and Rhythm: Normal rate and regular rhythm.     Pulses: Normal pulses.     Heart sounds: Normal heart sounds. No murmur heard.    No friction rub. No gallop.  Pulmonary:     Effort: Pulmonary effort is  normal. No respiratory distress.     Breath sounds: Normal breath sounds. No stridor. No wheezing, rhonchi or rales.  Chest:     Chest wall: No tenderness.  Abdominal:     General: Abdomen is flat. Bowel sounds are normal. There is no distension.     Palpations: Abdomen is soft. There is no mass.     Tenderness: There is no abdominal tenderness. There is no right CVA tenderness, left CVA tenderness, guarding or rebound.     Hernia: No hernia is present.  Genitourinary:    General: Normal vulva.     Exam position: Lithotomy position.     Labia:        Right: No rash.        Left: No rash.      Vagina: Normal. No erythema or lesions.     Cervix: No cervical motion tenderness, friability, lesion or erythema.     Uterus: Normal.      Adnexa: Right adnexa normal and left adnexa normal.       Right: No mass.         Left: No mass.     Musculoskeletal:     Cervical back: Normal range of motion and neck supple. No rigidity.     Right lower leg: No edema.     Left lower leg: No edema.  Lymphadenopathy:     Cervical: No cervical adenopathy.   Skin:    General: Skin is warm.     Coloration: Skin is not jaundiced or pale.     Findings: Rash present. No bruising, erythema or lesion. Rash is papular. Rash is not crusting, nodular, purpuric, pustular, scaling or vesicular.   Neurological:     General: No focal deficit present.     Mental Status: She is alert. Mental status is at baseline.     Cranial Nerves: No cranial nerve deficit.     Sensory: No sensory deficit.      Motor: No weakness.     Coordination: Coordination normal.     Gait: Gait normal.     Deep Tendon Reflexes: Reflexes normal.   Psychiatric:        Mood and Affect: Mood normal.        Behavior: Behavior normal.        Thought Content: Thought content normal.        Judgment: Judgment normal.           Assessment & Plan:  Papular urticaria Patient has 100s of the small bite marks.  I believe this is likely due to fleas or chiggers or mosquitoes while walking outside in tall grass.  I do believe is a local allergic reaction most likely to an insect bite.  Begin prednisone taper pack and use topical Elocon for itching

## 2024-06-09 DIAGNOSIS — S42202D Unspecified fracture of upper end of left humerus, subsequent encounter for fracture with routine healing: Secondary | ICD-10-CM | POA: Diagnosis not present

## 2024-06-09 DIAGNOSIS — M25612 Stiffness of left shoulder, not elsewhere classified: Secondary | ICD-10-CM | POA: Diagnosis not present

## 2024-06-09 DIAGNOSIS — M6281 Muscle weakness (generalized): Secondary | ICD-10-CM | POA: Diagnosis not present

## 2024-06-09 DIAGNOSIS — Z96612 Presence of left artificial shoulder joint: Secondary | ICD-10-CM | POA: Diagnosis not present

## 2024-06-12 ENCOUNTER — Telehealth: Payer: Self-pay

## 2024-06-12 NOTE — Telephone Encounter (Signed)
 Copied from CRM [id number]. Topic: Clinical - Medical Advice >> Jun 12, 2024  9:44 AM Sophia H wrote: Reason for CRM: patient states she still has a rash on the back of her leg after being prescribed predniSONE (DELTASONE) 20 MG tablet. Wanting to know what she should do or if anything else can be prescribed, no appointment's in clinic available till July 8th. # [phone]    [id number] - [place], [place] - [address] AT [facility]

## 2024-06-13 ENCOUNTER — Other Ambulatory Visit: Payer: Self-pay | Admitting: Family Medicine

## 2024-06-13 MED ORDER — LEVOCETIRIZINE DIHYDROCHLORIDE 5 MG PO TABS: 5.0000 mg | ORAL_TABLET | Freq: Every evening | ORAL | 0 refills | Status: DC

## 2024-06-13 MED ORDER — TRIAMCINOLONE ACETONIDE 0.1 % EX CREA: 1.0000 | TOPICAL_CREAM | Freq: Three times a day (TID) | CUTANEOUS | 0 refills | Status: DC

## 2024-06-16 ENCOUNTER — Other Ambulatory Visit: Payer: Self-pay | Admitting: Family Medicine

## 2024-06-16 DIAGNOSIS — L958 Other vasculitis limited to the skin: Secondary | ICD-10-CM | POA: Diagnosis not present

## 2024-06-16 DIAGNOSIS — M310 Hypersensitivity angiitis: Secondary | ICD-10-CM | POA: Diagnosis not present

## 2024-06-16 DIAGNOSIS — L718 Other rosacea: Secondary | ICD-10-CM | POA: Diagnosis not present

## 2024-06-17 ENCOUNTER — Ambulatory Visit
Admission: RE | Admit: 2024-06-17 | Discharge: 2024-06-17 | Disposition: A | Discharge status: Home / Self Care | Source: Ambulatory Visit | Attending: Nurse Practitioner

## 2024-06-17 DIAGNOSIS — Z08 Encounter for follow-up examination after completed treatment for malignant neoplasm: Secondary | ICD-10-CM | POA: Diagnosis not present

## 2024-06-17 DIAGNOSIS — Z853 Personal history of malignant neoplasm of breast: Secondary | ICD-10-CM | POA: Diagnosis not present

## 2024-06-17 DIAGNOSIS — Z9889 Other specified postprocedural states: Secondary | ICD-10-CM

## 2024-06-18 ENCOUNTER — Other Ambulatory Visit: Payer: Self-pay | Admitting: Nurse Practitioner

## 2024-06-18 DIAGNOSIS — C50911 Malignant neoplasm of unspecified site of right female breast: Secondary | ICD-10-CM

## 2024-06-18 NOTE — Progress Notes (Signed)
 Patient Care Team: Duanne Butler DASEN, MD as PCP - General (Family Medicine) Tyree Nanetta SAILOR, RN as Oncology Nurse Navigator Glean, Stephane BROCKS, RN (Inactive) as Oncology Nurse Navigator Vanderbilt Ned, MD as Consulting Physician (General Surgery) Lanny Callander, MD as Consulting Physician (Hematology) Shannon Agent, MD as Consulting Physician (Radiation Oncology) Burton, Lacie K, NP as Nurse Practitioner (Nurse Practitioner)  Clinic Day:  06/19/2024  Referring physician: Duanne Butler DASEN, MD  ASSESSMENT & PLAN:   Assessment & Plan: malignant neoplasm right central breast  ER+/PR+/HER2-, Grade 2, pT1cN0M0, Oncotype RS 24 -Diagnosed in 04/2020 with 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 -S/p right breast lumpectomy and SLNB with Dr Vanderbilt on 06/08/20. Her RS was 24.  Adjuvant chemotherapy was not recommended, she completed adjuvant Radiation. -She began antiestrogen therapy with Exemestane  in 09/2020, tolerating well overall.  -[name] is clinically doing well. Most recent mammogram 04/2023 was negative, breast density category C.  We discussed the role of additional screening MRI in certain patients. In the past she had declined this imaging. She is now interested in this. Will send order and get the scheduled for her in next few weeks.  -Continue breast cancer surveillance and exemestane  was refilled -Follow-up in 6 months, or sooner if needed   Vasculitis Patient recently treated for vasculitis of the bilateral lower extremities.  Was initially seen by her primary care and referred to her dermatologist.  Currently on prednisone .  This is gradually improving.  She will follow-up with primary care/dermatology as scheduled.  Plan: Reviewed labs. -CBC and CMP are essentially unremarkable.  Slight elevation of WBC and ANC.  Likely due to vasculitis and current treatment with prednisone . Exam is benign today. Continue exemestane  25 mg daily  Bilateral breast MRI in  12/2024 Labs and follow up 6 months, sooner if needed   The patient understands the plans discussed today and is in agreement with them.  She knows to contact our office if she develops concerns prior to her next appointment.  I provided 25 minutes of face-to-face time during this encounter and > 50% was spent counseling as documented under my assessment and plan.    Powell FORBES Lessen, NP  [facility] - [facility] DEPT OF [facility] [address] [place] [address] Dept: [phone] Dept Fax: [phone]   Orders Placed This Encounter  Procedures   MR BREAST BILATERAL W WO CONTRAST INC CAD    Standing Status:   Future    Expected Date:   12/23/2024    Expiration Date:   06/22/2025    If indicated for the ordered procedure, I authorize the administration of contrast media per Radiology protocol:   Yes    What is the patient's sedation requirement?:   No Sedation    Does the patient have a pacemaker or implanted devices?:   No    Radiology Contrast Protocol - do NOT remove file path:   [email or web address]    Preferred imaging location?:   [facility] (table limit - 500lbs)      CHIEF COMPLAINT:  CC: Infiltrating ductal carcinoma of the right breast  Current Treatment: Exemestane  starting 09/2020  INTERVAL HISTORY:  [name] is here today for repeat clinical assessment.  She was last seen by me on 12/20/2023.  She had a 3D diagnostic mammogram on 06/17/2024.  There was no evidence of malignancy, recurrence, or metastasis.  She will be due to have  bilateral breast MRI in January 2026.  Will order this at today's visit.  States about 2 and half weeks ago, she had to seek primary care for, which she thought was dermatitis.  She was referred to dermatology and diagnosed with vasculitis.  She is now on prednisone .  She states symptoms are gradually improving.  She does have some mild lower  extremity swelling as a result of vasculitis and treated with prednisone .  She is otherwise doing well.  She has noticed no new changes in her breasts.  She denies chest pain, chest pressure, or shortness of breath. She denies headaches or visual disturbances. She denies abdominal pain, nausea, vomiting, or changes in bowel or bladder habits.  She denies fevers or chills. She denies pain. Her appetite is good. Her weight has increased 10 pounds over last 3 months.  I have reviewed the past medical history, past surgical history, social history and family history with the patient and they are unchanged from previous note.  ALLERGIES:  has no known allergies.  MEDICATIONS:  Current Outpatient Medications  Medication Sig Dispense Refill   ADVIL 200 MG CAPS Take 800 mg by mouth every 6 (six) hours as needed (for pain or headaches).     Cholecalciferol  (VITAMIN D3) 50 MCG (2000 UT) TABS Take 2,000 Units by mouth daily after breakfast.     exemestane  (AROMASIN ) 25 MG tablet Take 1 tablet (25 mg total) by mouth daily after breakfast. 90 tablet 3   levocetirizine (XYZAL ) 5 MG tablet TAKE 1 TABLET BY MOUTH EVERY DAY IN THE EVENING 90 tablet 1   mometasone  (ELOCON ) 0.1 % cream Apply topically daily. 45 g 1   oxyCODONE -acetaminophen  (PERCOCET/ROXICET) 5-325 MG tablet Take 1 tablet by mouth every 6 (six) hours as needed for severe pain (pain score 7-10). 10 tablet 0   predniSONE  (DELTASONE ) 20 MG tablet 3 tabs poqday 1-2, 2 tabs poqday 3-4, 1 tab poqday 5-6 12 tablet 0   simvastatin  (ZOCOR ) 20 MG tablet Take 1 tablet (20 mg total) by mouth daily at 6 PM. 90 tablet 3   triamcinolone  cream (KENALOG ) 0.1 % Apply 1 Application topically 3 (three) times daily. 80 g 0   triamterene -hydrochlorothiazide  (MAXZIDE -25) 37.5-25 MG tablet Take 1 tablet by mouth daily. 90 tablet 3   No current facility-administered medications for this visit.    HISTORY OF PRESENT ILLNESS:   Oncology History Overview Note  Cancer  Staging malignant neoplasm right central breast Staging form: Breast, AJCC 8th Edition - Clinical stage from 05/04/2020: Stage IA (cT1, cN0, cM0, G2, ER+, PR+, HER2-) - Signed by Burton, Lacie K, NP on 05/24/2020 - Pathologic stage from 06/08/2020: Stage IA (pT1c, pN0, cM0, G2, ER+, PR+, HER2-, Oncotype DX score: 24) - Signed by Lanny Callander, MD on 08/22/2020    malignant neoplasm right central breast  04/27/2020 Breast US    FINDINGS: Mammogram: Spot compression tomosynthesis views of the right breast were performed. There is persistence of architectural distortion in the lower central to slightly outer right breast. The area of distortion spans approximately 0.9 cm. Ultrasound: Targeted ultrasound is performed in the right breast at 6 o'clock 1 cm from the nipple demonstrating an irregular hypoechoic mass measuring 0.8 x 0.6 x 0.6 cm. The surrounding tissues are distorted. No internal blood flow identified. Targeted ultrasound of the right axilla demonstrates normal-appearing lymph nodes.   IMPRESSION: Right breast mass at 6 o'clock measuring 0.8 cm is suspicious and likely corresponds to the distortion seen mammographically.   05/04/2020 Cancer  Staging   Staging form: Breast, AJCC 8th Edition - Clinical stage from 05/04/2020: Stage IA (cT1, cN0, cM0, G2, ER+, PR+, HER2-) - Signed by Burton, Lacie K, NP on 05/24/2020   05/04/2020 Initial Biopsy   FINAL MICROSCOPIC DIAGNOSIS:  A. BREAST, RIGHT/6:00, BIOPSY:  - Invasive ductal carcinoma.  - Ductal carcinoma in situ.  - Complex sclerosing lesion. COMMENT:  The carcinoma appears grade 2.  The greatest linear extent of tumor in any one core is 7 mm.   Immunohistochemical and morphometric analysis performed manually  The tumor cells are NEGATIVE for Her2 (1+).  Estrogen Receptor:       POSITIVE, 90% STRONG STAINING  Progesterone Receptor:   POSITIVE, 90% STRONG STAINING  Proliferation Marker Ki-67:   10%    05/06/2020 Initial Diagnosis    malignant neoplasm right central breast   06/02/2020 Genetic Testing   Negative genetic testing:  No pathogenic variants detected on the [facility] Breast Cancer STAT panel + Common Hereditary Cancers Panel. A variant of uncertain significance (VUS) was detected in the AXIN2 gene called c.1235A>C. The report date is 06/02/2020.  The Breast Cancer STAT Panel offered by [facility] includes sequencing and deletion/duplication analysis for the following 9 genes:  ATM, BRCA1, BRCA2, CDH1, CHEK2, PALB2, PTEN, STK11 and TP53. The Common Hereditary Cancers Panel offered by [facility] includes sequencing and/or deletion duplication testing of the following 48 genes: APC, ATM, AXIN2, BARD1, BMPR1A, BRCA1, BRCA2, BRIP1, CDH1, CDK4, CDKN2A (p14ARF), CDKN2A (p16INK4a), CHEK2, CTNNA1, DICER1, EPCAM (Deletion/duplication testing only), GREM1 (promoter region deletion/duplication testing only), KIT, MEN1, MLH1, MSH2, MSH3, MSH6, MUTYH, NBN, NF1, NHTL1, PALB2, PDGFRA, PMS2, POLD1, POLE, PTEN, RAD50, RAD51C, RAD51D, RNF43, SDHB, SDHC, SDHD, SMAD4, SMARCA4. STK11, TP53, TSC1, TSC2, and VHL.  The following genes were evaluated for sequence changes only: SDHA and HOXB13 c.251G>A variant only.   06/08/2020 Cancer Staging   Staging form: Breast, AJCC 8th Edition - Pathologic stage from 06/08/2020: Stage IA (pT1c, pN0, cM0, G2, ER+, PR+, HER2-, Oncotype DX score: 24) - Signed by Lanny Callander, MD on 08/22/2020   06/08/2020 Surgery   RIGHT BREAST LUMPECTOMY WITH RADIOACTIVE SEED AND SENTINEL LYMPH NODE MAPPING with Dr Vanderbilt    06/08/2020 Pathology Results   FINAL MICROSCOPIC DIAGNOSIS:   A. BREAST, RIGHT, LUMPECTOMY:  - Invasive ductal carcinoma, grade 2, spanning 1.1 cm.  - High grade ductal carcinoma in situ with necrosis.  - Biopsy site.  - Resection margins negative for invasive carcinoma.  - In situ carcinoma is <0.1 cm to the posterior margin broadly, see  comment.  - See oncology table.   B. LYMPH NODE, RIGHT AXILLARY,  SENTINEL, EXCISION:  - One of one lymph nodes negative for carcinoma (0/1).    06/08/2020 Oncotype testing   Recurrence score 24  Distant recurrence risk at 9 years with AI or Tamoxifen alone is 10% There is less than 1% benefit of adjuvant chemotherapy.    07/26/2020 - 08/25/2020 Radiation Therapy   Adjuvant Radiation with Dr Shannon 07/26/20-08/25/20   09/2020 -  Anti-estrogen oral therapy   Exemestane  25mg  once daily starting in 09/2020   11/22/2020 Survivorship   SCP delivered by Lacie Burton, NP        REVIEW OF SYSTEMS:   Constitutional: Denies fevers, chills or abnormal weight loss Eyes: Denies blurriness of vision Ears, nose, mouth, throat, and face: Denies mucositis or sore throat Respiratory: Denies cough, dyspnea or wheezes Cardiovascular: Denies palpitation or chest discomfort.  She has had mild swelling in both feet which  is also improving. Gastrointestinal:  Denies nausea, heartburn or change in bowel habits Skin: Currently being treated for vasculitis on bilateral lower legs.  Itchy rash is gradually improving. Lymphatics: Denies new lymphadenopathy or easy bruising Neurological:Denies numbness, tingling or new weaknesses Behavioral/Psych: Mood is stable, no new changes  All other systems were reviewed with the patient and are negative.   VITALS:   Today's Vitals   06/19/24 1036 06/19/24 1047  BP: 138/88   Pulse: 70   Resp: 17   Temp: 98.3 F (36.8 C)   Weight: 190 lb 1.6 oz (86.2 kg)   PainSc:  0-No pain   Body mass index is 34.77 kg/m.    Wt Readings from Last 3 Encounters:  06/19/24 190 lb 1.6 oz (86.2 kg)  06/06/24 186 lb 8 oz (84.6 kg)  03/31/24 180 lb (81.6 kg)    Body mass index is 34.77 kg/m.  Performance status (ECOG): 1 - Symptomatic but completely ambulatory  PHYSICAL EXAM:   GENERAL:alert, no distress and comfortable SKIN: skin color, texture, turgor are normal.  Inflamed, red rash on bilateral lower extremities. EYES: normal,  Conjunctiva are pink and non-injected, sclera clear OROPHARYNX:no exudate, no erythema and lips, buccal mucosa, and tongue normal  NECK: supple, thyroid normal size, non-tender, without nodularity LYMPH:  no palpable lymphadenopathy in the cervical, axillary or inguinal LUNGS: clear to auscultation and percussion with normal breathing effort HEART: regular rate & rhythm and no murmur.  There is mild, bilateral lower extremity swelling. ABDOMEN:abdomen soft, non-tender and normal bowel sounds Musculoskeletal:no cyanosis of digits and no clubbing  NEURO: alert & oriented x 3 with fluent speech, no focal motor/sensory deficits BREAST: The right breast does have a well-healed lumpectomy scar.  There are no palpable masses or lumps.  There are expected skin changes related to radiation treatment.  There is no nipple inversion or nipple discharge.  There is no axillary lymphadenopathy on the right.  The left breast does not have palpable lumps or masses today.  There is no nipple inversion or nipple discharge.  No axillary lymphadenopathy on the left.  LABORATORY DATA:  I have reviewed the data as listed    Component Value Date/Time   NA 141 06/19/2024 0949   K 3.8 06/19/2024 0949   CL 104 06/19/2024 0949   CO2 30 06/19/2024 0949   GLUCOSE 93 06/19/2024 0949   BUN 24 (H) 06/19/2024 0949   CREATININE 0.95 06/19/2024 0949   CREATININE 0.72 03/18/2024 1559   CALCIUM 9.7 06/19/2024 0949   PROT 7.2 06/19/2024 0949   ALBUMIN 4.3 06/19/2024 0949   AST 14 (L) 06/19/2024 0949   ALT 14 06/19/2024 0949   ALKPHOS 80 06/19/2024 0949   BILITOT 1.2 06/19/2024 0949   GFRNONAA >60 06/19/2024 0949   GFRNONAA 65 03/10/2021 1122   GFRAA 76 03/10/2021 1122    Lab Results  Component Value Date   WBC 12.9 (H) 06/19/2024   NEUTROABS 9.4 (H) 06/19/2024   HGB 13.0 06/19/2024   HCT 40.1 06/19/2024   MCV 83.4 06/19/2024   PLT 210 06/19/2024      RADIOGRAPHIC STUDIES: MM 3D DIAGNOSTIC MAMMOGRAM  BILATERAL BREAST Result Date: 06/17/2024 CLINICAL DATA:  73 year old female presenting for annual exam. History of right breast cancer status post lumpectomy in 2021. No new problems. EXAM: DIGITAL DIAGNOSTIC BILATERAL MAMMOGRAM WITH TOMOSYNTHESIS AND CAD TECHNIQUE: Bilateral digital diagnostic mammography and breast tomosynthesis was performed. The images were evaluated with computer-aided detection. COMPARISON:  Previous exam(s). ACR Breast Density Category  b: There are scattered areas of fibroglandular density. FINDINGS: Right breast: A spot 2D magnification view of the lumpectomy site was performed in addition to standard views. There are stable postsurgical changes. No suspicious mass, distortion, or microcalcifications are identified to suggest presence of malignancy. Left breast: No suspicious mass, distortion, or microcalcifications are identified to suggest presence of malignancy. IMPRESSION: No mammographic evidence of malignancy bilaterally. RECOMMENDATION: As the patient is now over 2 years out from her lumpectomy, she may return to annual screening mammography in 1 year. Given her history of breast cancer, she remains eligible for annual diagnostic mammography, if preferred. I have discussed the findings and recommendations with the patient. If applicable, a reminder letter will be sent to the patient regarding the next appointment. BI-RADS CATEGORY  2: Benign. Electronically Signed   By: Inocente Ast M.D.   On: 06/17/2024 11:57

## 2024-06-18 NOTE — Assessment & Plan Note (Addendum)
 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 -S/p right breast lumpectomy and SLNB with Dr Vanderbilt on 06/08/20. Her RS was 24.  Adjuvant chemotherapy was not recommended, she completed adjuvant Radiation. -She began antiestrogen therapy with Exemestane  in 09/2020, tolerating well overall.  -[name] is clinically doing well. Most recent mammogram 04/2023 was negative, breast density category C.  We discussed the role of additional screening MRI in certain patients. In the past she had declined this imaging. She is now interested in this.  -Diagnostic mammogram done 06/17/2024 with benign results. -Is due for bilateral breast MRI in January 2026.  Will order during today's visit. -Continue exemestane  daily. -Continue breast cancer surveillance  -Follow-up in 6 months, or sooner if needed

## 2024-06-19 ENCOUNTER — Inpatient Hospital Stay (HOSPITAL_BASED_OUTPATIENT_CLINIC_OR_DEPARTMENT_OTHER): Payer: Medicare PPO | Admitting: Nurse Practitioner

## 2024-06-19 ENCOUNTER — Inpatient Hospital Stay: Payer: Medicare PPO | Attending: Nurse Practitioner

## 2024-06-19 VITALS — BP 138/88 | HR 70 | Temp 98.3°F | Resp 17 | Wt 190.1 lb

## 2024-06-19 DIAGNOSIS — I776 Arteritis, unspecified: Secondary | ICD-10-CM | POA: Insufficient documentation

## 2024-06-19 DIAGNOSIS — Z79811 Long term (current) use of aromatase inhibitors: Secondary | ICD-10-CM | POA: Insufficient documentation

## 2024-06-19 DIAGNOSIS — C50911 Malignant neoplasm of unspecified site of right female breast: Secondary | ICD-10-CM

## 2024-06-19 DIAGNOSIS — Z1732 Human epidermal growth factor receptor 2 negative status: Secondary | ICD-10-CM | POA: Insufficient documentation

## 2024-06-19 DIAGNOSIS — Z923 Personal history of irradiation: Secondary | ICD-10-CM | POA: Diagnosis not present

## 2024-06-19 DIAGNOSIS — Z170 Estrogen receptor positive status [ER+]: Secondary | ICD-10-CM | POA: Insufficient documentation

## 2024-06-19 DIAGNOSIS — C50111 Malignant neoplasm of central portion of right female breast: Secondary | ICD-10-CM | POA: Diagnosis not present

## 2024-06-19 LAB — CBC WITH DIFFERENTIAL (CANCER CENTER ONLY)
Abs Immature Granulocytes: 0.17 K/uL — ABNORMAL HIGH (ref 0.00–0.07)
Basophils Absolute: 0.1 K/uL (ref 0.0–0.1)
Basophils Relative: 1 %
Eosinophils Absolute: 0.1 K/uL (ref 0.0–0.5)
Eosinophils Relative: 1 %
HCT: 40.1 % (ref 36.0–46.0)
Hemoglobin: 13 g/dL (ref 12.0–15.0)
Immature Granulocytes: 1 %
Lymphocytes Relative: 20 %
Lymphs Abs: 2.6 K/uL (ref 0.7–4.0)
MCH: 27 pg (ref 26.0–34.0)
MCHC: 32.4 g/dL (ref 30.0–36.0)
MCV: 83.4 fL (ref 80.0–100.0)
Monocytes Absolute: 0.6 K/uL (ref 0.1–1.0)
Monocytes Relative: 4 %
Neutro Abs: 9.4 K/uL — ABNORMAL HIGH (ref 1.7–7.7)
Neutrophils Relative %: 73 %
Platelet Count: 210 K/uL (ref 150–400)
RBC: 4.81 MIL/uL (ref 3.87–5.11)
RDW: 14.6 % (ref 11.5–15.5)
WBC Count: 12.9 K/uL — ABNORMAL HIGH (ref 4.0–10.5)
nRBC: 0 % (ref 0.0–0.2)

## 2024-06-19 LAB — CMP (CANCER CENTER ONLY)
ALT: 14 U/L (ref 0–44)
AST: 14 U/L — ABNORMAL LOW (ref 15–41)
Albumin: 4.3 g/dL (ref 3.5–5.0)
Alkaline Phosphatase: 80 U/L (ref 38–126)
Anion gap: 7 (ref 5–15)
BUN: 24 mg/dL — ABNORMAL HIGH (ref 8–23)
CO2: 30 mmol/L (ref 22–32)
Calcium: 9.7 mg/dL (ref 8.9–10.3)
Chloride: 104 mmol/L (ref 98–111)
Creatinine: 0.95 mg/dL (ref 0.44–1.00)
GFR, Estimated: >60 mL/min (ref >60)
Glucose, Bld: 93 mg/dL (ref 70–99)
Potassium: 3.8 mmol/L (ref 3.5–5.1)
Sodium: 141 mmol/L (ref 135–145)
Total Bilirubin: 1.2 mg/dL (ref 0.0–1.2)
Total Protein: 7.2 g/dL (ref 6.5–8.1)

## 2024-06-20 DIAGNOSIS — M6281 Muscle weakness (generalized): Secondary | ICD-10-CM | POA: Diagnosis not present

## 2024-06-20 DIAGNOSIS — Z96612 Presence of left artificial shoulder joint: Secondary | ICD-10-CM | POA: Diagnosis not present

## 2024-06-20 DIAGNOSIS — M25612 Stiffness of left shoulder, not elsewhere classified: Secondary | ICD-10-CM | POA: Diagnosis not present

## 2024-06-20 DIAGNOSIS — S42202D Unspecified fracture of upper end of left humerus, subsequent encounter for fracture with routine healing: Secondary | ICD-10-CM | POA: Diagnosis not present

## 2024-06-22 ENCOUNTER — Encounter: Payer: Self-pay | Admitting: Nurse Practitioner

## 2024-06-25 DIAGNOSIS — M25612 Stiffness of left shoulder, not elsewhere classified: Secondary | ICD-10-CM | POA: Diagnosis not present

## 2024-06-25 DIAGNOSIS — M6281 Muscle weakness (generalized): Secondary | ICD-10-CM | POA: Diagnosis not present

## 2024-06-25 DIAGNOSIS — Z96612 Presence of left artificial shoulder joint: Secondary | ICD-10-CM | POA: Diagnosis not present

## 2024-06-25 DIAGNOSIS — S42202D Unspecified fracture of upper end of left humerus, subsequent encounter for fracture with routine healing: Secondary | ICD-10-CM | POA: Diagnosis not present

## 2024-06-26 DIAGNOSIS — L958 Other vasculitis limited to the skin: Secondary | ICD-10-CM | POA: Diagnosis not present

## 2024-06-27 ENCOUNTER — Encounter: Payer: Self-pay | Admitting: Family Medicine

## 2024-06-27 ENCOUNTER — Ambulatory Visit: Admitting: Family Medicine

## 2024-06-27 VITALS — BP 142/82 | HR 103 | Temp 98.3°F | Ht 62.0 in | Wt 190.8 lb

## 2024-06-27 DIAGNOSIS — I776 Arteritis, unspecified: Secondary | ICD-10-CM | POA: Diagnosis not present

## 2024-06-27 NOTE — Progress Notes (Signed)
 Subjective:    Patient ID: [name], female    DOB: 03-17-1951, 73 y.o.   MRN: [id number]  HPI Please see my last office visit.  I saw the patient initially for a papular rash on both legs distal to the knee.  I was concerned that it was due to insect bites or possibly some kind of allergic reaction.  We treated the patient initially with prednisone .  However the reaction persisted and the patient saw dermatology.  Dermatology performed a skin biopsy which showed vasculitis.  They referred back to us  discussed referral rheumatology.  Patient denies any systemic symptoms.  She denies any joint pains, chest pain, shortness of breath, hemoptysis, hematuria.  The rash is completely subsided distal to the knee.  She does have some numbness and tingling and paresthesias in both legs however that preceded this incident.  She has been dealing with that for months.  The only sign of possible systemic issues is a slight malar rash that has developed on both cheeks and above her nose.  However this could be steroid induced rosacea as she finished prednisone  about 10 days ago. Past Medical History:  Diagnosis Date   Arthritis    Breast cancer (HCC)    Family history of bone cancer    Family history of colon cancer    Family history of leukemia    Family history of lung cancer    Family history of melanoma    Family history of ovarian cancer    Family history of stomach cancer    Family history of uterine cancer    History of kidney stones    Hyperlipemia    Hypertension    Invasive ductal carcinoma of breast (HCC)    right    radiation only   Lower extremity edema    Obesity    PAC (premature atrial contraction)    Past Surgical History:  Procedure Laterality Date   BREAST BIOPSY Left    BREAST LUMPECTOMY WITH RADIOACTIVE SEED AND SENTINEL LYMPH NODE BIOPSY Right 06/08/2020   Procedure: RIGHT BREAST LUMPECTOMY WITH RADIOACTIVE SEED AND SENTINEL LYMPH NODE MAPPING;  Surgeon: Vanderbilt Ned, MD;  Location: [facility];  Service: General;  Laterality: Right;  PEC BLOCK   CHOLECYSTECTOMY     COLONOSCOPY  01/01/2012   Procedure: COLONOSCOPY;  Surgeon: Lamar CHRISTELLA Hollingshead, MD;  [facility];  Service: Endoscopy;  Laterality: N/A;  10:00 AM   COLONOSCOPY WITH PROPOFOL  N/A 10/07/2020   Procedure: COLONOSCOPY WITH PROPOFOL ;  Surgeon: Hollingshead Lamar CHRISTELLA, MD;  [facility];  Service: Endoscopy;  Laterality: N/A;  9:45am   CYSTOSCOPY WITH RETROGRADE PYELOGRAM, URETEROSCOPY AND STENT PLACEMENT Left 12/11/2023   Procedure: CYSTOSCOPY WITH RETROGRADE PYELOGRAM, URETEROSCOPY AND STENT PLACEMENT;  Surgeon: Carolee Sherwood JONETTA DOUGLAS, MD;  Location: [facility];  Service: Urology;  Laterality: Left;   JOINT REPLACEMENT  2006   left knee   POLYPECTOMY  10/07/2020   Procedure: POLYPECTOMY;  Surgeon: Hollingshead Lamar CHRISTELLA, MD;  [facility];  Service: Endoscopy;;   TOTAL KNEE ARTHROPLASTY Right 01/13/2022   Procedure: TOTAL KNEE ARTHROPLASTY;  Surgeon: Edna Toribio LABOR, MD;  Location: [facility];  Service: Orthopedics;  Laterality: Right;   US  ECHOCARDIOGRAPHY  08/21/2011   LA mild to mod. dilated,mild MR,TR   Current Outpatient Medications on File Prior to Visit  Medication Sig Dispense Refill   ADVIL 200 MG CAPS Take 800 mg by mouth every 6 (six) hours as needed (for  pain or headaches).     Cholecalciferol  (VITAMIN D3) 50 MCG (2000 UT) TABS Take 2,000 Units by mouth daily after breakfast.     exemestane  (AROMASIN ) 25 MG tablet Take 1 tablet (25 mg total) by mouth daily after breakfast. 90 tablet 3   levocetirizine (XYZAL ) 5 MG tablet TAKE 1 TABLET BY MOUTH EVERY DAY IN THE EVENING 90 tablet 1   mometasone  (ELOCON ) 0.1 % cream Apply topically daily. 45 g 1   oxyCODONE -acetaminophen  (PERCOCET/ROXICET) 5-325 MG tablet Take 1 tablet by mouth every 6 (six) hours as needed for severe pain (pain score 7-10). 10 tablet 0   predniSONE  (DELTASONE ) 20 MG tablet 3 tabs poqday 1-2, 2 tabs poqday 3-4, 1  tab poqday 5-6 12 tablet 0   simvastatin  (ZOCOR ) 20 MG tablet Take 1 tablet (20 mg total) by mouth daily at 6 PM. 90 tablet 3   triamcinolone  cream (KENALOG ) 0.1 % Apply 1 Application topically 3 (three) times daily. 80 g 0   triamterene -hydrochlorothiazide  (MAXZIDE -25) 37.5-25 MG tablet Take 1 tablet by mouth daily. 90 tablet 3   No current facility-administered medications on file prior to visit.   No Known Allergies Social History   Socioeconomic History   Marital status: Married    Spouse name: Not on file   Number of children: 2   Years of education: Not on file   Highest education level: 12th grade  Occupational History   Not on file  Tobacco Use   Smoking status: Never   Smokeless tobacco: Never  Vaping Use   Vaping status: Never Used  Substance and Sexual Activity   Alcohol  use: No   Drug use: No   Sexual activity: Not Currently  Other Topics Concern   Not on file  Social History Narrative   Not on file   Social Drivers of Health   Financial Resource Strain: Low Risk  (06/05/2024)   Overall Financial Resource Strain (CARDIA)    Difficulty of Paying Living Expenses: Not hard at all  Food Insecurity: No Food Insecurity (06/05/2024)   Hunger Vital Sign    Worried About Running Out of Food in the Last Year: Never true    Ran Out of Food in the Last Year: Never true  Transportation Needs: No Transportation Needs (06/05/2024)   PRAPARE - Administrator, Civil Service (Medical): No    Lack of Transportation (Non-Medical): No  Physical Activity: Inactive (06/05/2024)   Exercise Vital Sign    Days of Exercise per Week: 0 days    Minutes of Exercise per Session: Not on file  Stress: No Stress Concern Present (06/05/2024)   [employer] of Occupational Health - Occupational Stress Questionnaire    Feeling of Stress: Not at all  Social Connections: Moderately Integrated (06/05/2024)   Social Connection and Isolation Panel    Frequency of Communication with  Friends and Family: Once a week    Frequency of Social Gatherings with Friends and Family: Once a week    Attends Religious Services: More than 4 times per year    Active Member of [employer] or Organizations: Yes    Attends Banker Meetings: 1 to 4 times per year    Marital Status: Married  Catering manager Violence: Not At Risk (12/11/2023)   Humiliation, Afraid, Rape, and Kick questionnaire    Fear of Current or Ex-Partner: No    Emotionally Abused: No    Physically Abused: No    Sexually Abused: No  Review of Systems  All other systems reviewed and are negative.      Objective:   Physical Exam Vitals reviewed.  Constitutional:      General: She is not in acute distress.    Appearance: Normal appearance. She is normal weight. She is not ill-appearing, toxic-appearing or diaphoretic.  HENT:     Head: Normocephalic and atraumatic.   Neck:     Vascular: No carotid bruit.  Cardiovascular:     Rate and Rhythm: Normal rate and regular rhythm.     Pulses: Normal pulses.     Heart sounds: Normal heart sounds. No murmur heard.    No friction rub. No gallop.  Pulmonary:     Effort: Pulmonary effort is normal. No respiratory distress.     Breath sounds: Normal breath sounds. No stridor. No wheezing, rhonchi or rales.  Chest:     Chest wall: No tenderness.  Abdominal:     General: Abdomen is flat. Bowel sounds are normal. There is no distension.     Palpations: Abdomen is soft. There is no mass.     Tenderness: There is no abdominal tenderness. There is no right CVA tenderness, left CVA tenderness, guarding or rebound.     Hernia: No hernia is present.  Musculoskeletal:     Cervical back: Normal range of motion and neck supple. No rigidity.     Right lower leg: No edema.     Left lower leg: No edema.  Lymphadenopathy:     Cervical: No cervical adenopathy.  Skin:    General: Skin is warm.     Coloration: Skin is not jaundiced or pale.     Findings: Rash  present. No bruising, erythema or lesion. Rash is not crusting, nodular, papular, purpuric, pustular, scaling or vesicular.  Neurological:     General: No focal deficit present.     Mental Status: She is alert. Mental status is at baseline.     Cranial Nerves: No cranial nerve deficit.     Sensory: No sensory deficit.     Motor: No weakness.     Coordination: Coordination normal.     Gait: Gait normal.     Deep Tendon Reflexes: Reflexes normal.  Psychiatric:        Mood and Affect: Mood normal.        Behavior: Behavior normal.        Thought Content: Thought content normal.        Judgment: Judgment normal.           Assessment & Plan:  Vasculitis (HCC) - Plan: CBC with Differential/Platelet, Comprehensive metabolic panel with GFR, Vitamin B12, TSH, ANA, ANCA Screen Reflex Titer, Sedimentation rate, C-reactive protein, Rheumatoid factor Patient has recently had vasculitis.  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  We need rule out systemic vasculitis.  Start by checking a CBC, CMP, an ANA, and ANCA, sed rate, CRP, and a rheumatoid factor.  Given her neuropathy I will also check a B12 and a TSH.  If labs are completely normal and rash remains resolved, I do not feel that she needs to see rheumatology yet as this most likely was cutaneous vasculitis.  If there is sign of systemic involvement she certainly would benefit from seeing a rheumatologist.  I hope that this rash on the face is not lupus but rather represents mild steroid-induced rosacea

## 2024-06-30 DIAGNOSIS — M25612 Stiffness of left shoulder, not elsewhere classified: Secondary | ICD-10-CM | POA: Diagnosis not present

## 2024-06-30 DIAGNOSIS — M6281 Muscle weakness (generalized): Secondary | ICD-10-CM | POA: Diagnosis not present

## 2024-06-30 DIAGNOSIS — Z96612 Presence of left artificial shoulder joint: Secondary | ICD-10-CM | POA: Diagnosis not present

## 2024-06-30 DIAGNOSIS — S42202D Unspecified fracture of upper end of left humerus, subsequent encounter for fracture with routine healing: Secondary | ICD-10-CM | POA: Diagnosis not present

## 2024-07-01 ENCOUNTER — Ambulatory Visit: Payer: Self-pay | Admitting: Family Medicine

## 2024-07-05 LAB — CBC WITH DIFFERENTIAL/PLATELET
Absolute Lymphocytes: 2113 {cells}/uL (ref 850–3900)
Absolute Monocytes: 319 {cells}/uL (ref 200–950)
Basophils Absolute: 30 {cells}/uL (ref 0–200)
Basophils Relative: 0.4 %
Eosinophils Absolute: 304 {cells}/uL (ref 15–500)
Eosinophils Relative: 4 %
HCT: 42.5 % (ref 35.0–45.0)
Hemoglobin: 13.1 g/dL (ref 11.7–15.5)
MCH: 26.4 pg — ABNORMAL LOW (ref 27.0–33.0)
MCHC: 30.8 g/dL — ABNORMAL LOW (ref 32.0–36.0)
MCV: 85.5 fL (ref 80.0–100.0)
MPV: 9.7 fL (ref 7.5–12.5)
Monocytes Relative: 4.2 %
Neutro Abs: 4834 {cells}/uL (ref 1500–7800)
Neutrophils Relative %: 63.6 %
Platelets: 198 Thousand/uL (ref 140–400)
RBC: 4.97 Million/uL (ref 3.80–5.10)
RDW: 13.9 % (ref 11.0–15.0)
Total Lymphocyte: 27.8 %
WBC: 7.6 Thousand/uL (ref 3.8–10.8)

## 2024-07-05 LAB — COMPREHENSIVE METABOLIC PANEL WITH GFR
AG Ratio: 1.6 (calc) (ref 1.0–2.5)
ALT: 16 U/L (ref 6–29)
AST: 14 U/L (ref 10–35)
Albumin: 4.2 g/dL (ref 3.6–5.1)
Alkaline phosphatase (APISO): 74 U/L (ref 37–153)
BUN: 18 mg/dL (ref 7–25)
CO2: 28 mmol/L (ref 20–32)
Calcium: 9.7 mg/dL (ref 8.6–10.4)
Chloride: 102 mmol/L (ref 98–110)
Creat: 0.8 mg/dL (ref 0.60–1.00)
Globulin: 2.6 g/dL (ref 1.9–3.7)
Glucose, Bld: 124 mg/dL — ABNORMAL HIGH (ref 65–99)
Potassium: 4 mmol/L (ref 3.5–5.3)
Sodium: 141 mmol/L (ref 135–146)
Total Bilirubin: 1.2 mg/dL (ref 0.2–1.2)
Total Protein: 6.8 g/dL (ref 6.1–8.1)
eGFR: 78 mL/min/1.73m2 (ref >=60)

## 2024-07-05 LAB — RHEUMATOID FACTOR: Rheumatoid fact SerPl-aCnc: <10 [IU]/mL (ref <14)

## 2024-07-05 LAB — ANCA SCREEN W REFLEX TITER: ANCA SCREEN: NEGATIVE

## 2024-07-05 LAB — TSH: TSH: 1.39 m[IU]/L (ref 0.40–4.50)

## 2024-07-05 LAB — VITAMIN B12: Vitamin B-12: 310 pg/mL (ref 200–1100)

## 2024-07-05 LAB — C-REACTIVE PROTEIN: CRP: 19.5 mg/L — ABNORMAL HIGH (ref <8.0)

## 2024-07-05 LAB — SEDIMENTATION RATE: Sed Rate: 2 mm/h (ref 0–30)

## 2024-07-05 LAB — ANA: Anti Nuclear Antibody (ANA): NEGATIVE

## 2024-07-07 DIAGNOSIS — S42202D Unspecified fracture of upper end of left humerus, subsequent encounter for fracture with routine healing: Secondary | ICD-10-CM | POA: Diagnosis not present

## 2024-07-08 DIAGNOSIS — S42202D Unspecified fracture of upper end of left humerus, subsequent encounter for fracture with routine healing: Secondary | ICD-10-CM | POA: Diagnosis not present

## 2024-07-08 DIAGNOSIS — Z96612 Presence of left artificial shoulder joint: Secondary | ICD-10-CM | POA: Diagnosis not present

## 2024-07-08 DIAGNOSIS — M25612 Stiffness of left shoulder, not elsewhere classified: Secondary | ICD-10-CM | POA: Diagnosis not present

## 2024-07-08 DIAGNOSIS — M6281 Muscle weakness (generalized): Secondary | ICD-10-CM | POA: Diagnosis not present

## 2024-07-16 DIAGNOSIS — S42202D Unspecified fracture of upper end of left humerus, subsequent encounter for fracture with routine healing: Secondary | ICD-10-CM | POA: Diagnosis not present

## 2024-07-16 DIAGNOSIS — Z96612 Presence of left artificial shoulder joint: Secondary | ICD-10-CM | POA: Diagnosis not present

## 2024-07-16 DIAGNOSIS — M25612 Stiffness of left shoulder, not elsewhere classified: Secondary | ICD-10-CM | POA: Diagnosis not present

## 2024-07-16 DIAGNOSIS — M6281 Muscle weakness (generalized): Secondary | ICD-10-CM | POA: Diagnosis not present

## 2024-07-21 DIAGNOSIS — Z96612 Presence of left artificial shoulder joint: Secondary | ICD-10-CM | POA: Diagnosis not present

## 2024-07-21 DIAGNOSIS — S42202D Unspecified fracture of upper end of left humerus, subsequent encounter for fracture with routine healing: Secondary | ICD-10-CM | POA: Diagnosis not present

## 2024-07-21 DIAGNOSIS — M6281 Muscle weakness (generalized): Secondary | ICD-10-CM | POA: Diagnosis not present

## 2024-07-21 DIAGNOSIS — M25612 Stiffness of left shoulder, not elsewhere classified: Secondary | ICD-10-CM | POA: Diagnosis not present

## 2024-07-30 DIAGNOSIS — Z96612 Presence of left artificial shoulder joint: Secondary | ICD-10-CM | POA: Diagnosis not present

## 2024-07-30 DIAGNOSIS — M6281 Muscle weakness (generalized): Secondary | ICD-10-CM | POA: Diagnosis not present

## 2024-07-30 DIAGNOSIS — S42202D Unspecified fracture of upper end of left humerus, subsequent encounter for fracture with routine healing: Secondary | ICD-10-CM | POA: Diagnosis not present

## 2024-07-30 DIAGNOSIS — M25612 Stiffness of left shoulder, not elsewhere classified: Secondary | ICD-10-CM | POA: Diagnosis not present

## 2024-08-06 DIAGNOSIS — M6281 Muscle weakness (generalized): Secondary | ICD-10-CM | POA: Diagnosis not present

## 2024-08-06 DIAGNOSIS — S42202D Unspecified fracture of upper end of left humerus, subsequent encounter for fracture with routine healing: Secondary | ICD-10-CM | POA: Diagnosis not present

## 2024-08-06 DIAGNOSIS — Z96612 Presence of left artificial shoulder joint: Secondary | ICD-10-CM | POA: Diagnosis not present

## 2024-08-06 DIAGNOSIS — M25612 Stiffness of left shoulder, not elsewhere classified: Secondary | ICD-10-CM | POA: Diagnosis not present

## 2024-08-10 ENCOUNTER — Other Ambulatory Visit: Payer: Self-pay | Admitting: Nurse Practitioner

## 2024-08-13 DIAGNOSIS — Z96612 Presence of left artificial shoulder joint: Secondary | ICD-10-CM | POA: Diagnosis not present

## 2024-08-13 DIAGNOSIS — M6281 Muscle weakness (generalized): Secondary | ICD-10-CM | POA: Diagnosis not present

## 2024-08-13 DIAGNOSIS — S42202D Unspecified fracture of upper end of left humerus, subsequent encounter for fracture with routine healing: Secondary | ICD-10-CM | POA: Diagnosis not present

## 2024-08-13 DIAGNOSIS — M25612 Stiffness of left shoulder, not elsewhere classified: Secondary | ICD-10-CM | POA: Diagnosis not present

## 2024-09-08 DIAGNOSIS — H5231 Anisometropia: Secondary | ICD-10-CM | POA: Diagnosis not present

## 2024-09-08 DIAGNOSIS — H5203 Hypermetropia, bilateral: Secondary | ICD-10-CM | POA: Diagnosis not present

## 2024-09-08 DIAGNOSIS — H52223 Regular astigmatism, bilateral: Secondary | ICD-10-CM | POA: Diagnosis not present

## 2024-09-08 DIAGNOSIS — H524 Presbyopia: Secondary | ICD-10-CM | POA: Diagnosis not present

## 2024-10-06 DIAGNOSIS — Z96612 Presence of left artificial shoulder joint: Secondary | ICD-10-CM | POA: Diagnosis not present

## 2024-10-13 DIAGNOSIS — H11001 Unspecified pterygium of right eye: Secondary | ICD-10-CM | POA: Diagnosis not present

## 2024-10-13 DIAGNOSIS — H2513 Age-related nuclear cataract, bilateral: Secondary | ICD-10-CM | POA: Diagnosis not present

## 2024-11-19 ENCOUNTER — Ambulatory Visit

## 2024-11-19 VITALS — Ht 62.0 in | Wt 190.0 lb

## 2024-11-19 DIAGNOSIS — Z0000 Encounter for general adult medical examination without abnormal findings: Secondary | ICD-10-CM

## 2024-11-19 NOTE — Progress Notes (Signed)
 Chief Complaint  Patient presents with   Medicare Wellness     Subjective:   [name] is a 73 y.o. female who presents for a Medicare Annual Wellness Visit.  Visit info / Clinical Intake: Interpreter Needed?: No Patient's Overall Health Status Rating: good Typical amount of pain: none Does pain affect daily life?: no Are you currently prescribed opioids?: no  Dietary Habits and Nutritional Risks How many meals a day?: 3 Eats fruit and vegetables daily?: yes Most meals are obtained by: preparing own meals In the last 2 weeks, have you had any of the following?: none Diabetic:: no  Functional Status Activities of Daily Living (to include ambulation/medication): Independent Ambulation: Independent Medication Administration: Independent Home Management (perform basic housework or laundry): Independent Manage your own finances?: yes Primary transportation is: driving Concerns about vision?: no *vision screening is required for WTM* Concerns about hearing?: no  Fall Screening Falls in the past year?: 0 Number of falls in past year: 0 Was there an injury with Fall?: 0 Fall Risk Category Calculator: 0 Patient Fall Risk Level: Low Fall Risk  Fall Risk Patient at Risk for Falls Due to: No Fall Risks Fall risk Follow up: Education provided; Falls prevention discussed; Falls evaluation completed  Home and Transportation Safety: All rugs have non-skid backing?: yes All stairs or steps have railings?: yes Grab bars in the bathtub or shower?: yes Have non-skid surface in bathtub or shower?: yes Good home lighting?: yes Regular seat belt use?: yes Hospital stays in the last year:: no  Cognitive Assessment Difficulty concentrating, remembering, or making decisions? : no Will 6CIT or Mini Cog be Completed: no 6CIT or Mini Cog Declined: patient alert, oriented, able to answer questions appropriately and recall recent events What year is it?: 0 points What month is  it?: 0 points Give patient an address phrase to remember (5 components): its very sunny outside today in December About what time is it?: 0 points Count backwards from 20 to 1: 0 points Say the months of the year in reverse: 0 points Repeat the address phrase from earlier: 0 points 6 CIT Score: 0 points  Advance Directives (For Healthcare) Does Patient Have a Medical Advance Directive?: No Would patient like information on creating a medical advance directive?: Yes (MAU/Ambulatory/Procedural Areas - Information given)  Reviewed/Updated  Reviewed/Updated: Reviewed All (Medical, Surgical, Family, Medications, Allergies, Care Teams, Patient Goals)    Allergies (verified) Patient has no known allergies.   Current Medications (verified) Outpatient Encounter Medications as of 11/19/2024  Medication Sig   ADVIL 200 MG CAPS Take 800 mg by mouth every 6 (six) hours as needed (for pain or headaches).   Cholecalciferol  (VITAMIN D3) 50 MCG (2000 UT) TABS Take 2,000 Units by mouth daily after breakfast.   exemestane  (AROMASIN ) 25 MG tablet TAKE 1 TABLET (25 MG TOTAL) BY MOUTH DAILY AFTER BREAKFAST.   mometasone  (ELOCON ) 0.1 % cream Apply topically daily.   simvastatin  (ZOCOR ) 20 MG tablet Take 1 tablet (20 mg total) by mouth daily at 6 PM.   triamterene -hydrochlorothiazide  (MAXZIDE -25) 37.5-25 MG tablet Take 1 tablet by mouth daily.   [DISCONTINUED] oxyCODONE -acetaminophen  (PERCOCET/ROXICET) 5-325 MG tablet Take 1 tablet by mouth every 6 (six) hours as needed for severe pain (pain score 7-10).   [DISCONTINUED] predniSONE  (DELTASONE ) 20 MG tablet 3 tabs poqday 1-2, 2 tabs poqday 3-4, 1 tab poqday 5-6   [DISCONTINUED] triamcinolone  cream (KENALOG ) 0.1 % Apply 1 Application topically 3 (three) times daily.   No facility-administered encounter  medications on file as of 11/19/2024.    History: Past Medical History:  Diagnosis Date   Arthritis    Breast cancer (HCC)    Family history of bone  cancer    Family history of colon cancer    Family history of leukemia    Family history of lung cancer    Family history of melanoma    Family history of ovarian cancer    Family history of stomach cancer    Family history of uterine cancer    History of kidney stones    Hyperlipemia    Hypertension    Invasive ductal carcinoma of breast (HCC)    right    radiation only   Lower extremity edema    Obesity    PAC (premature atrial contraction)    Past Surgical History:  Procedure Laterality Date   BREAST BIOPSY Left    BREAST LUMPECTOMY WITH RADIOACTIVE SEED AND SENTINEL LYMPH NODE BIOPSY Right 06/08/2020   Procedure: RIGHT BREAST LUMPECTOMY WITH RADIOACTIVE SEED AND SENTINEL LYMPH NODE MAPPING;  Surgeon: Vanderbilt Ned, MD;  Location: [facility];  Service: General;  Laterality: Right;  PEC BLOCK   CHOLECYSTECTOMY     COLONOSCOPY  01/01/2012   Procedure: COLONOSCOPY;  Surgeon: Lamar CHRISTELLA Hollingshead, MD;  [facility];  Service: Endoscopy;  Laterality: N/A;  10:00 AM   COLONOSCOPY WITH PROPOFOL  N/A 10/07/2020   Procedure: COLONOSCOPY WITH PROPOFOL ;  Surgeon: Hollingshead Lamar CHRISTELLA, MD;  [facility];  Service: Endoscopy;  Laterality: N/A;  9:45am   CYSTOSCOPY WITH RETROGRADE PYELOGRAM, URETEROSCOPY AND STENT PLACEMENT Left 12/11/2023   Procedure: CYSTOSCOPY WITH RETROGRADE PYELOGRAM, URETEROSCOPY AND STENT PLACEMENT;  Surgeon: Carolee Sherwood JONETTA DOUGLAS, MD;  [facility]: [facility];  Service: Urology;  Laterality: Left;   JOINT REPLACEMENT  2006   left knee   POLYPECTOMY  10/07/2020   Procedure: POLYPECTOMY;  Surgeon: Hollingshead Lamar CHRISTELLA, MD;  [facility];  Service: Endoscopy;;   TOTAL KNEE ARTHROPLASTY Right 01/13/2022   Procedure: TOTAL KNEE ARTHROPLASTY;  Surgeon: Edna Toribio LABOR, MD;  Location: [facility];  Service: Orthopedics;  Laterality: Right;   US  ECHOCARDIOGRAPHY  08/21/2011   LA mild to mod. dilated,mild MR,TR   Family History  Problem Relation Age of Onset   Hypertension  Mother    Colon cancer Mother 69   Cancer Mother    Hypertension Father    Heart attack Father    Melanoma Father        dx. in his early 66s (x3)   Heart disease Father    Uterine cancer Maternal Aunt        dx. >50   Cancer Daughter 28       ovarian (dysgerminoma)   Leukemia Paternal Uncle        dx. in his late 30s   Stomach cancer Maternal Grandmother        dx. in her 43s   Bone cancer Maternal Aunt        dx. in her early 76s   Lung cancer Maternal Aunt    Testicular cancer Cousin        dx. in his late 30s/early 37s (maternal cousin)   Anesthesia problems Neg Hx    Social History   Occupational History   Not on file  Tobacco Use   Smoking status: Never   Smokeless tobacco: Never  Vaping Use   Vaping status: Never Used  Substance and Sexual Activity   Alcohol  use:  Never   Drug use: Never   Sexual activity: Yes   Tobacco Counseling Counseling given: Not Answered  SDOH Screenings   Food Insecurity: No Food Insecurity (11/14/2024)  Housing: Low Risk  (11/14/2024)  Transportation Needs: No Transportation Needs (11/14/2024)  Utilities: Not At Risk (11/19/2024)  Alcohol  Screen: Low Risk  (11/29/2023)  Depression (PHQ2-9): Low Risk  (11/19/2024)  Financial Resource Strain: Low Risk  (11/14/2024)  Physical Activity: Inactive (11/14/2024)  Social Connections: Moderately Integrated (11/14/2024)  Stress: No Stress Concern Present (11/14/2024)  Tobacco Use: Low Risk  (11/19/2024)  Health Literacy: Adequate Health Literacy (11/19/2024)   See flowsheets for full screening details  Depression Screen PHQ 2 & 9 Depression Scale- Over the past 2 weeks, how often have you been bothered by any of the following problems? Little interest or pleasure in doing things: 0 Feeling down, depressed, or hopeless (PHQ Adolescent also includes...irritable): 0 PHQ-2 Total Score: 0 Trouble falling or staying asleep, or sleeping too much: 0 Feeling tired or having little energy: 0 Poor  appetite or overeating (PHQ Adolescent also includes...weight loss): 0 Feeling bad about yourself - or that you are a failure or have let yourself or your family down: 0 Trouble concentrating on things, such as reading the newspaper or watching television (PHQ Adolescent also includes...like school work): 0 Moving or speaking so slowly that other people could have noticed. Or the opposite - being so fidgety or restless that you have been moving around a lot more than usual: 0 Thoughts that you would be better off dead, or of hurting yourself in some way: 0 PHQ-9 Total Score: 0 If you checked off any problems, how difficult have these problems made it for you to do your work, take care of things at home, or get along with other people?: Not difficult at all  Depression Treatment Depression Interventions/Treatment : EYV7-0 Score <4 Follow-up Not Indicated     Goals Addressed             This Visit's Progress    Patient Stated   On track    I am reducing bread, and potatoes in my diet      COMPLETED: Weight (lb) < 200 lb (90.7 kg)   190 lb (86.2 kg)            Objective:    Today's Vitals   11/19/24 1011  Weight: 190 lb (86.2 kg)  Height: 5' 2 (1.575 m)   Body mass index is 34.75 kg/m.  Hearing/Vision screen Hearing Screening - Comments:: Patient is able to hear conversational tones without difficulty. No issues reported.   Vision Screening - Comments:: Wears rx glasses - up to date with routine eye exams with Dr. Heron Salinas  Immunizations and Health Maintenance Health Maintenance  Topic Date Due   COVID-19 Vaccine (1) Never done   Zoster Vaccines- Shingrix  (2 of 2) 05/13/2024   Bone Density Scan  09/08/2024   Hepatitis C Screening  11/28/2024 (Originally 09/18/1969)   Influenza Vaccine  03/10/2025 (Originally 07/11/2024)   Mammogram  06/17/2025   Medicare Annual Wellness (AWV)  11/19/2025   DTaP/Tdap/Td (2 - Td or Tdap) 06/12/2027   Colonoscopy  10/07/2030    Pneumococcal Vaccine: 50+ Years  Completed   Meningococcal B Vaccine  Aged Out        Assessment/Plan:  This is a routine wellness examination for [place].  Patient Care Team: Duanne Butler DASEN, MD as PCP - General (Family Medicine) Tyree Nanetta SAILOR, RN as Oncology Nurse  Navigator Cornett, Debby, MD as Consulting Physician (General Surgery) Lanny Callander, MD as Consulting Physician (Hematology) Shannon Agent, MD as Consulting Physician (Radiation Oncology) Burton, Lacie K, NP as Nurse Practitioner (Nurse Practitioner) Josefina Chew, MD as Consulting Physician (Orthopedic Surgery) Hanford Powell BRAVO, NP as Nurse Practitioner (Oncology) Shona Rush, MD (Dermatology) Nicholaus Sherlyn CROME, NP as Nurse Practitioner  I have personally reviewed and noted the following in the patients chart:   Medical and social history Use of alcohol , tobacco or illicit drugs  Current medications and supplements including opioid prescriptions. Functional ability and status Nutritional status Physical activity Advanced directives List of other physicians Hospitalizations, surgeries, and ER visits in previous 12 months Vitals Screenings to include cognitive, depression, and falls Referrals and appointments  No orders of the defined types were placed in this encounter.  In addition, I have reviewed and discussed with patient certain preventive protocols, quality metrics, and best practice recommendations. A written personalized care plan for preventive services as well as general preventive health recommendations were provided to patient.   Lavelle Charmaine Browner, LPN   [id number]   Return in 1 year (on 11/19/2025).  After Visit Summary: (MyChart) Due to this being a telephonic visit, the after visit summary with patients personalized plan was offered to patient via MyChart   Nurse Notes: Vaccines not given: Will obtain 2nd Shingrix  at next visit

## 2024-11-19 NOTE — Patient Instructions (Signed)
 [name],  Thank you for taking the time for your Medicare Wellness Visit. I appreciate your continued commitment to your health goals. Please review the care plan we discussed, and feel free to reach out if I can assist you further.  Please note that Annual Wellness Visits do not include a physical exam. Some assessments may be limited, especially if the visit was conducted virtually. If needed, we may recommend an in-person follow-up with your provider.  Ongoing Care Seeing your primary care provider every 3 to 6 months helps us  monitor your health and provide consistent, personalized care.   Referrals If a referral was made during today's visit and you haven't received any updates within two weeks, please contact the referred provider directly to check on the status.  Recommended Screenings:  Health Maintenance  Topic Date Due   COVID-19 Vaccine (1) Never done   Zoster (Shingles) Vaccine (2 of 2) 05/13/2024   Osteoporosis screening with Bone Density Scan  09/08/2024   Hepatitis C Screening  11/28/2024*   Flu Shot  03/10/2025*   Breast Cancer Screening  06/17/2025   Medicare Annual Wellness Visit  11/19/2025   DTaP/Tdap/Td vaccine (2 - Td or Tdap) 06/12/2027   Colon Cancer Screening  10/07/2030   Pneumococcal Vaccine for age over 63  Completed   Meningitis B Vaccine  Aged Out  *Topic was postponed. The date shown is not the original due date.       11/19/2024   10:12 AM  Advanced Directives  Does Patient Have a Medical Advance Directive? No  Would patient like information on creating a medical advance directive? Yes (MAU/Ambulatory/Procedural Areas - Information given)   Information on Advanced Care Planning can be found at [place]  Secretary of [facility] Advance Health Care Directives Advance Health Care Directives ([email or web address]    Vision: Annual vision screenings are recommended for early detection of glaucoma, cataracts, and diabetic retinopathy. These exams can also  reveal signs of chronic conditions such as diabetes and high blood pressure.  Dental: Annual dental screenings help detect early signs of oral cancer, gum disease, and other conditions linked to overall health, including heart disease and diabetes.  Please see the attached documents for additional preventive care recommendations.

## 2024-12-16 DIAGNOSIS — C50911 Malignant neoplasm of unspecified site of right female breast: Secondary | ICD-10-CM

## 2024-12-16 NOTE — Assessment & Plan Note (Addendum)
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 -S/p right breast lumpectomy and SLNB with Dr Vanderbilt on 06/08/20. Her RS was 24.  Adjuvant chemotherapy was not recommended, she completed adjuvant Radiation. -She began antiestrogen therapy with Exemestane  in 09/2020, tolerating well overall.  -[name] is clinically doing well. Most recent mammogram 04/2023 was negative, breast density category C.  We discussed the role of additional screening MRI in certain patients. In the past she had declined this imaging. She is now interested in this.  -Diagnostic mammogram done 06/17/2024 with benign results. -Is due for bilateral breast MRI in January 2026.  Will order during today's visit. - Breast MRI scheduled for December 23, 2024. - Continue exemestane  daily. - Labs with follow-up in 6 months, sooner if needed. "

## 2024-12-16 NOTE — Progress Notes (Signed)
 " Patient Care Team: Duanne Butler DASEN, MD as PCP - General (Family Medicine) Tyree Nanetta SAILOR, RN as Oncology Nurse Navigator Vanderbilt Ned, MD as Consulting Physician (General Surgery) Lanny Callander, MD as Consulting Physician (Hematology) Shannon Agent, MD as Consulting Physician (Radiation Oncology) Ann Mayme POUR, NP as Nurse Practitioner (Nurse Practitioner) Josefina Chew, MD as Consulting Physician (Orthopedic Surgery) Hanford Powell BRAVO, NP as Nurse Practitioner (Oncology) Shona Rush, MD (Dermatology) Nicholaus Sherlyn CROME, NP as Nurse Practitioner  Clinic Day:  12/17/2024  Referring physician: Duanne Butler DASEN, MD  ASSESSMENT & PLAN:   Assessment & Plan: malignant neoplasm right central breast  ER+/PR+/HER2-, Grade 2, pT1cN0M0, Oncotype RS 24 -Diagnosed in 04/2020 with 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 -S/p right breast lumpectomy and SLNB with Dr Vanderbilt on 06/08/20. Her RS was 24.  Adjuvant chemotherapy was not recommended, she completed adjuvant Radiation. -She began antiestrogen therapy with Exemestane  in 09/2020, tolerating well overall.  -[name] is clinically doing well. Most recent mammogram 04/2023 was negative, breast density category C.  We discussed the role of additional screening MRI in certain patients. In the past she had declined this imaging. She is now interested in this.  -Diagnostic mammogram done 06/17/2024 with benign results. -Is due for bilateral breast MRI in January 2026.  Will order during today's visit. - Breast MRI scheduled for December 23, 2024. - Continue exemestane  daily. - Labs with follow-up in 6 months, sooner if needed.   Bone health Most recent DEXA scan done 09/08/2022.  Bone density was considered to be normal.  She is due for new DEXA scan.  Will order as part of today's visit.  Right breast cancer, ER + Oncotype RS score is 24.  Currently on exemestane .  Bilateral breast MRI scheduled for 12/23/2024.  Plan Labs  reviewed. - Unremarkable CBC and CMP. Bilateral breast MRI scheduled for 08/23/2025. DEXA scan to be scheduled at [facility] in near future. Continue exemestane  daily. Continue breast cancer surveillance. Labs and follow-up in 6 months, sooner if needed.  The patient understands the plans discussed today and is in agreement with them.  She knows to contact our office if she develops concerns prior to her next appointment.  I provided 25 minutes of face-to-face time during this encounter and > 50% was spent counseling as documented under my assessment and plan.    [name], NP  [facility] - A DEPT OF [facility] [address] [place] [address] Dept: [phone] Dept Fax: [phone]   Orders Placed This Encounter  Procedures   DG Bone Density    Standing Status:   Future    Expected Date:   01/29/2025    Expiration Date:   12/29/2025    Reason for Exam (SYMPTOM  OR DIAGNOSIS REQUIRED):   estrogen deficiency. history of breast cancer, on exemestane .    Preferred imaging location?:   [facility]      CHIEF COMPLAINT:  CC: Right breast cancer, ER +  Current Treatment: Exemestane  daily (started 09/2020)  INTERVAL HISTORY:  [name] is here today for repeat clinical assessment.  She last saw me on 06/19/2024.  3D diagnostic mammogram done on 06/17/2024.  There was no evidence of metastatic disease or malignancy.  She is due for MRI of bilateral breast.  This is scheduled for 12/23/2024.  She is due for repeat bone density test in near future.  She denies changes,  lumps, or new masses in either breast.  She continues exemestane  without negative side effects.  She denies chest pain, chest pressure, or shortness of breath. She denies headaches or visual disturbances. She denies abdominal pain, nausea, vomiting, or changes in bowel or bladder habits.    She denies fevers or chills. She denies pain. Her  appetite is good. Her weight has been stable.  I have reviewed the past medical history, past surgical history, social history and family history with the patient and they are unchanged from previous note.  ALLERGIES:  has no known allergies.  MEDICATIONS:  Current Outpatient Medications  Medication Sig Dispense Refill   ADVIL 200 MG CAPS Take 800 mg by mouth every 6 (six) hours as needed (for pain or headaches).     Cholecalciferol  (VITAMIN D3) 50 MCG (2000 UT) TABS Take 2,000 Units by mouth daily after breakfast.     exemestane  (AROMASIN ) 25 MG tablet TAKE 1 TABLET (25 MG TOTAL) BY MOUTH DAILY AFTER BREAKFAST. 90 tablet 3   mometasone  (ELOCON ) 0.1 % cream Apply topically daily. 45 g 1   simvastatin  (ZOCOR ) 20 MG tablet Take 1 tablet (20 mg total) by mouth daily at 6 PM. 90 tablet 3   triamterene -hydrochlorothiazide  (MAXZIDE -25) 37.5-25 MG tablet Take 1 tablet by mouth daily. 90 tablet 3   No current facility-administered medications for this visit.    HISTORY OF PRESENT ILLNESS:   Oncology History Overview Note  Cancer Staging malignant neoplasm right central breast Staging form: Breast, AJCC 8th Edition - Clinical stage from 05/04/2020: Stage IA (cT1, cN0, cM0, G2, ER+, PR+, HER2-) - Signed by Burton, Lacie K, NP on 05/24/2020 - Pathologic stage from 06/08/2020: Stage IA (pT1c, pN0, cM0, G2, ER+, PR+, HER2-, Oncotype DX score: 24) - Signed by Lanny Callander, MD on 08/22/2020    malignant neoplasm right central breast  04/27/2020 Breast US    FINDINGS: Mammogram: Spot compression tomosynthesis views of the right breast were performed. There is persistence of architectural distortion in the lower central to slightly outer right breast. The area of distortion spans approximately 0.9 cm. Ultrasound: Targeted ultrasound is performed in the right breast at 6 o'clock 1 cm from the nipple demonstrating an irregular hypoechoic mass measuring 0.8 x 0.6 x 0.6 cm. The surrounding tissues are  distorted. No internal blood flow identified. Targeted ultrasound of the right axilla demonstrates normal-appearing lymph nodes.   IMPRESSION: Right breast mass at 6 o'clock measuring 0.8 cm is suspicious and likely corresponds to the distortion seen mammographically.   05/04/2020 Cancer Staging   Staging form: Breast, AJCC 8th Edition - Clinical stage from 05/04/2020: Stage IA (cT1, cN0, cM0, G2, ER+, PR+, HER2-) - Signed by Burton, Lacie K, NP on 05/24/2020   05/04/2020 Initial Biopsy   FINAL MICROSCOPIC DIAGNOSIS:  A. BREAST, RIGHT/6:00, BIOPSY:  - Invasive ductal carcinoma.  - Ductal carcinoma in situ.  - Complex sclerosing lesion. COMMENT:  The carcinoma appears grade 2.  The greatest linear extent of tumor in any one core is 7 mm.   Immunohistochemical and morphometric analysis performed manually  The tumor cells are NEGATIVE for Her2 (1+).  Estrogen Receptor:       POSITIVE, 90% STRONG STAINING  Progesterone Receptor:   POSITIVE, 90% STRONG STAINING  Proliferation Marker Ki-67:   10%    05/06/2020 Initial Diagnosis   malignant neoplasm right central breast   06/02/2020 Genetic Testing   Negative genetic testing:  No pathogenic variants detected on the [facility] Breast Cancer STAT panel +  Common Hereditary Cancers Panel. A variant of uncertain significance (VUS) was detected in the AXIN2 gene called c.1235A>C. The report date is 06/02/2020.  The Breast Cancer STAT Panel offered by [facility] includes sequencing and deletion/duplication analysis for the following 9 genes:  ATM, BRCA1, BRCA2, CDH1, CHEK2, PALB2, PTEN, STK11 and TP53. The Common Hereditary Cancers Panel offered by [facility] includes sequencing and/or deletion duplication testing of the following 48 genes: APC, ATM, AXIN2, BARD1, BMPR1A, BRCA1, BRCA2, BRIP1, CDH1, CDK4, CDKN2A (p14ARF), CDKN2A (p16INK4a), CHEK2, CTNNA1, DICER1, EPCAM (Deletion/duplication testing only), GREM1 (promoter region deletion/duplication testing  only), KIT, MEN1, MLH1, MSH2, MSH3, MSH6, MUTYH, NBN, NF1, NHTL1, PALB2, PDGFRA, PMS2, POLD1, POLE, PTEN, RAD50, RAD51C, RAD51D, RNF43, SDHB, SDHC, SDHD, SMAD4, SMARCA4. STK11, TP53, TSC1, TSC2, and VHL.  The following genes were evaluated for sequence changes only: SDHA and HOXB13 c.251G>A variant only.   06/08/2020 Cancer Staging   Staging form: Breast, AJCC 8th Edition - Pathologic stage from 06/08/2020: Stage IA (pT1c, pN0, cM0, G2, ER+, PR+, HER2-, Oncotype DX score: 24) - Signed by Lanny Callander, MD on 08/22/2020   06/08/2020 Surgery   RIGHT BREAST LUMPECTOMY WITH RADIOACTIVE SEED AND SENTINEL LYMPH NODE MAPPING with Dr Vanderbilt    06/08/2020 Pathology Results   FINAL MICROSCOPIC DIAGNOSIS:   A. BREAST, RIGHT, LUMPECTOMY:  - Invasive ductal carcinoma, grade 2, spanning 1.1 cm.  - High grade ductal carcinoma in situ with necrosis.  - Biopsy site.  - Resection margins negative for invasive carcinoma.  - In situ carcinoma is <0.1 cm to the posterior margin broadly, see  comment.  - See oncology table.   B. LYMPH NODE, RIGHT AXILLARY, SENTINEL, EXCISION:  - One of one lymph nodes negative for carcinoma (0/1).    06/08/2020 Oncotype testing   Recurrence score 24  Distant recurrence risk at 9 years with AI or Tamoxifen alone is 10% There is less than 1% benefit of adjuvant chemotherapy.    07/26/2020 - 08/25/2020 Radiation Therapy   Adjuvant Radiation with Dr Shannon 07/26/20-08/25/20   09/2020 -  Anti-estrogen oral therapy   Exemestane  25mg  once daily starting in 09/2020   11/22/2020 Survivorship   SCP delivered by Lacie Burton, NP        REVIEW OF SYSTEMS:   Constitutional: Denies fevers, chills or abnormal weight loss Eyes: Denies blurriness of vision Ears, nose, mouth, throat, and face: Denies mucositis or sore throat Respiratory: Denies cough, dyspnea or wheezes Cardiovascular: Denies palpitation, chest discomfort or lower extremity swelling Gastrointestinal:  Denies nausea,  heartburn or change in bowel habits Skin: Denies abnormal skin rashes Lymphatics: Denies new lymphadenopathy or easy bruising Neurological:Denies numbness, tingling or new weaknesses Behavioral/Psych: Mood is stable, no new changes  All other systems were reviewed with the patient and are negative.   VITALS:   Today's Vitals   12/17/24 0948 12/17/24 0949  BP: 134/74   Pulse: 82   Resp: 17   Temp: 98.3 F (36.8 C)   SpO2: 97%   Weight: 187 lb 6.4 oz (85 kg)   PainSc:  0-No pain   Body mass index is 34.28 kg/m.    Wt Readings from Last 3 Encounters:  12/17/24 187 lb 6.4 oz (85 kg)  11/19/24 190 lb (86.2 kg)  06/27/24 190 lb 12.8 oz (86.5 kg)    Body mass index is 34.28 kg/m.  Performance status (ECOG): 1 - Symptomatic but completely ambulatory  PHYSICAL EXAM:   GENERAL:alert, no distress and comfortable SKIN: skin color, texture, turgor are normal, no  rashes or significant lesions EYES: normal, Conjunctiva are pink and non-injected, sclera clear OROPHARYNX:no exudate, no erythema and lips, buccal mucosa, and tongue normal  NECK: supple, thyroid normal size, non-tender, without nodularity LYMPH:  no palpable lymphadenopathy in the cervical, axillary or inguinal LUNGS: clear to auscultation and percussion with normal breathing effort HEART: regular rate & rhythm and no murmurs and no lower extremity edema ABDOMEN:abdomen soft, non-tender and normal bowel sounds Musculoskeletal:no cyanosis of digits and no clubbing  NEURO: alert & oriented x 3 with fluent speech, no focal motor/sensory deficits BREAST: Well-healed right lumpectomy scar on the right breast.  There is no nipple inversion or discharge.  No palpable masses or lumps are noted.  No axillary adenopathy on the right.  The left breast has no palpable lumps or masses today.  There is no nipple inversion or nipple discharge.  There is no axillary lymphadenopathy on the left side.  LABORATORY DATA:  I have reviewed  the data as listed    Component Value Date/Time   NA 142 12/17/2024 0927   K 3.6 12/17/2024 0927   CL 102 12/17/2024 0927   CO2 27 12/17/2024 0927   GLUCOSE 106 (H) 12/17/2024 0927   BUN 16 12/17/2024 0927   CREATININE 0.84 12/17/2024 0927   CREATININE 0.80 06/27/2024 0902   CALCIUM 10.2 12/17/2024 0927   PROT 7.7 12/17/2024 0927   ALBUMIN 4.7 12/17/2024 0927   AST 24 12/17/2024 0927   ALT 20 12/17/2024 0927   ALKPHOS 91 12/17/2024 0927   BILITOT 1.3 (H) 12/17/2024 0927   GFRNONAA >60 12/17/2024 0927   GFRNONAA 65 03/10/2021 1122   GFRAA 76 03/10/2021 1122     Lab Results  Component Value Date   WBC 8.0 12/17/2024   NEUTROABS 4.8 12/17/2024   HGB 13.7 12/17/2024   HCT 40.5 12/17/2024   MCV 81.2 12/17/2024   PLT 219 12/17/2024     RADIOGRAPHIC STUDIES: MR BREAST BILATERAL W WO CONTRAST INC CAD Result Date: 12/24/2024 CLINICAL DATA:  74 year old female presents for a high-risk screening breast MRI. Patient has a prior history of right breast cancer diagnosed in May 2021. EXAM: BILATERAL BREAST MRI WITH AND WITHOUT CONTRAST TECHNIQUE: Multiplanar, multisequence MR images of both breasts were obtained prior to and following the intravenous administration of 8 ml of Gadavist  Three-dimensional MR images were rendered by post-processing of the original MR data on an independent workstation. The three-dimensional MR images were interpreted, and findings are reported in the following complete MRI report for this study. Three dimensional images were evaluated at the independent interpreting workstation using the DynaCAD thin client. COMPARISON:  Previous exam(s), most recent MRI dated 12/28/2023. FINDINGS: Breast composition: b. Scattered fibroglandular tissue. Background parenchymal enhancement: Mild Right breast: Post lumpectomy changes about the slightly lower slightly outer breast. No mass or abnormal enhancement. Left breast: No mass or abnormal enhancement. Lymph nodes: No abnormal  appearing lymph nodes. Ancillary findings:  None. IMPRESSION: No MRI evidence of malignancy. RECOMMENDATION: 1. Continued annual screening mammograms. 2. Continued annual high-risk screening breast MRI as clinically indicated. The [employer] of [employer] and The Society of Breast Imaging recommends annual breast MRI and mammography in patients with genetics based increased risk, a calculated lifetime risk of 20% or more, a history of radiation therapy at a young age, personal history of breast cancer with dense breast tissue and those diagnosed with breast cancer before age 30. Others with a personal history of breast cancer should strongly consider supplemental screening with MRI, especially  if other risk factors are present. Women with dense breasts who desire supplemental screening should undergo breast MRI. Women with atypia or LCIS should consider supplemental surveillance with MRI, especially if other risk factors are present. BI-RADS CATEGORY  2: Benign. Electronically Signed   By: Curtistine Noble   On: 12/24/2024 05:13   "

## 2024-12-17 ENCOUNTER — Inpatient Hospital Stay: Admitting: Nurse Practitioner

## 2024-12-17 ENCOUNTER — Inpatient Hospital Stay: Attending: Nurse Practitioner

## 2024-12-17 VITALS — BP 134/74 | HR 82 | Temp 98.3°F | Resp 17 | Wt 187.4 lb

## 2024-12-17 DIAGNOSIS — C50911 Malignant neoplasm of unspecified site of right female breast: Secondary | ICD-10-CM

## 2024-12-17 DIAGNOSIS — E2839 Other primary ovarian failure: Secondary | ICD-10-CM

## 2024-12-17 LAB — CMP (CANCER CENTER ONLY)
ALT: 20 U/L (ref 0–44)
AST: 24 U/L (ref 15–41)
Albumin: 4.7 g/dL (ref 3.5–5.0)
Alkaline Phosphatase: 91 U/L (ref 38–126)
Anion gap: 12 (ref 5–15)
BUN: 16 mg/dL (ref 8–23)
CO2: 27 mmol/L (ref 22–32)
Calcium: 10.2 mg/dL (ref 8.9–10.3)
Chloride: 102 mmol/L (ref 98–111)
Creatinine: 0.84 mg/dL (ref 0.44–1.00)
GFR, Estimated: >60 mL/min
Glucose, Bld: 106 mg/dL — ABNORMAL HIGH (ref 70–99)
Potassium: 3.6 mmol/L (ref 3.5–5.1)
Sodium: 142 mmol/L (ref 135–145)
Total Bilirubin: 1.3 mg/dL — ABNORMAL HIGH (ref 0.0–1.2)
Total Protein: 7.7 g/dL (ref 6.5–8.1)

## 2024-12-17 LAB — CBC WITH DIFFERENTIAL (CANCER CENTER ONLY)
Abs Immature Granulocytes: 0.04 K/uL (ref 0.00–0.07)
Basophils Absolute: 0 K/uL (ref 0.0–0.1)
Basophils Relative: 1 %
Eosinophils Absolute: 0.2 K/uL (ref 0.0–0.5)
Eosinophils Relative: 2 %
HCT: 40.5 % (ref 36.0–46.0)
Hemoglobin: 13.7 g/dL (ref 12.0–15.0)
Immature Granulocytes: 1 %
Lymphocytes Relative: 31 %
Lymphs Abs: 2.5 K/uL (ref 0.7–4.0)
MCH: 27.5 pg (ref 26.0–34.0)
MCHC: 33.8 g/dL (ref 30.0–36.0)
MCV: 81.2 fL (ref 80.0–100.0)
Monocytes Absolute: 0.4 K/uL (ref 0.1–1.0)
Monocytes Relative: 5 %
Neutro Abs: 4.8 K/uL (ref 1.7–7.7)
Neutrophils Relative %: 60 %
Platelet Count: 219 K/uL (ref 150–400)
RBC: 4.99 MIL/uL (ref 3.87–5.11)
RDW: 13.4 % (ref 11.5–15.5)
WBC Count: 8 K/uL (ref 4.0–10.5)
nRBC: 0 % (ref 0.0–0.2)

## 2024-12-23 ENCOUNTER — Ambulatory Visit (HOSPITAL_COMMUNITY)
Admission: RE | Admit: 2024-12-23 | Discharge: 2024-12-23 | Disposition: A | Discharge status: Home / Self Care | Source: Ambulatory Visit | Attending: Nurse Practitioner | Admitting: Nurse Practitioner

## 2024-12-23 DIAGNOSIS — C50911 Malignant neoplasm of unspecified site of right female breast: Secondary | ICD-10-CM | POA: Diagnosis present

## 2024-12-23 MED ORDER — GADOBUTROL 1 MMOL/ML IV SOLN
8.0000 mL | Freq: Once | INTRAVENOUS | Status: AC | PRN
  Administered 2024-12-23: 8 mL via INTRAVENOUS

## 2024-12-29 ENCOUNTER — Encounter: Payer: Self-pay | Admitting: Nurse Practitioner

## 2025-01-06 ENCOUNTER — Encounter: Admitting: Family Medicine

## 2025-02-10 ENCOUNTER — Encounter: Admitting: Family Medicine

## 2025-06-16 ENCOUNTER — Inpatient Hospital Stay: Admitting: Nurse Practitioner

## 2025-06-16 ENCOUNTER — Inpatient Hospital Stay
